# Patient Record
Sex: Female | Born: 1955
Health system: Southern US, Community
[De-identification: ages and names within clinical notes are randomized; demographics above are authoritative.]

## PROBLEM LIST (undated history)

## (undated) DIAGNOSIS — I10 Essential (primary) hypertension: Secondary | ICD-10-CM

## (undated) DIAGNOSIS — E119 Type 2 diabetes mellitus without complications: Secondary | ICD-10-CM

## (undated) DIAGNOSIS — S149XXA Injury of unspecified nerves of neck, initial encounter: Secondary | ICD-10-CM

## (undated) DIAGNOSIS — G589 Mononeuropathy, unspecified: Secondary | ICD-10-CM

## (undated) HISTORY — PX: CARDIAC SURGERY: SHX584

---

## 1997-08-01 ENCOUNTER — Other Ambulatory Visit: Admission: RE | Admit: 1997-08-01 | Discharge: 1997-08-01 | Payer: Self-pay | Admitting: *Deleted

## 1997-08-08 ENCOUNTER — Inpatient Hospital Stay (HOSPITAL_COMMUNITY): Admission: RE | Admit: 1997-08-08 | Discharge: 1997-08-14 | Payer: Self-pay | Admitting: Surgery

## 1997-08-14 ENCOUNTER — Emergency Department (HOSPITAL_COMMUNITY): Admission: EM | Admit: 1997-08-14 | Discharge: 1997-08-14 | Payer: Self-pay | Admitting: Emergency Medicine

## 1997-09-04 ENCOUNTER — Encounter: Admission: RE | Admit: 1997-09-04 | Discharge: 1997-09-04 | Payer: Self-pay | Admitting: Family Medicine

## 1997-09-18 ENCOUNTER — Encounter: Admission: RE | Admit: 1997-09-18 | Discharge: 1997-09-18 | Payer: Self-pay | Admitting: Family Medicine

## 1997-10-03 ENCOUNTER — Encounter: Admission: RE | Admit: 1997-10-03 | Discharge: 1997-10-03 | Payer: Self-pay | Admitting: Family Medicine

## 1997-10-09 ENCOUNTER — Encounter: Admission: RE | Admit: 1997-10-09 | Discharge: 1997-10-09 | Payer: Self-pay | Admitting: Family Medicine

## 1997-10-17 ENCOUNTER — Encounter: Admission: RE | Admit: 1997-10-17 | Discharge: 1997-10-17 | Payer: Self-pay | Admitting: Family Medicine

## 1997-11-06 ENCOUNTER — Encounter: Admission: RE | Admit: 1997-11-06 | Discharge: 1997-11-06 | Payer: Self-pay | Admitting: Family Medicine

## 1997-11-07 ENCOUNTER — Encounter: Admission: RE | Admit: 1997-11-07 | Discharge: 1997-11-07 | Payer: Self-pay | Admitting: Family Medicine

## 1997-11-15 ENCOUNTER — Encounter: Admission: RE | Admit: 1997-11-15 | Discharge: 1997-11-15 | Payer: Self-pay | Admitting: Family Medicine

## 1997-11-21 ENCOUNTER — Encounter: Admission: RE | Admit: 1997-11-21 | Discharge: 1997-11-21 | Payer: Self-pay | Admitting: Family Medicine

## 1997-11-28 ENCOUNTER — Encounter: Admission: RE | Admit: 1997-11-28 | Discharge: 1997-11-28 | Payer: Self-pay | Admitting: Family Medicine

## 1997-12-05 ENCOUNTER — Encounter: Admission: RE | Admit: 1997-12-05 | Discharge: 1997-12-05 | Payer: Self-pay | Admitting: Family Medicine

## 1997-12-31 ENCOUNTER — Encounter: Admission: RE | Admit: 1997-12-31 | Discharge: 1997-12-31 | Payer: Self-pay | Admitting: Sports Medicine

## 1998-01-02 ENCOUNTER — Encounter: Admission: RE | Admit: 1998-01-02 | Discharge: 1998-01-02 | Payer: Self-pay | Admitting: Family Medicine

## 1998-01-22 ENCOUNTER — Encounter: Admission: RE | Admit: 1998-01-22 | Discharge: 1998-01-22 | Payer: Self-pay | Admitting: Family Medicine

## 1998-02-06 ENCOUNTER — Encounter: Admission: RE | Admit: 1998-02-06 | Discharge: 1998-02-06 | Payer: Self-pay | Admitting: Family Medicine

## 1998-02-13 ENCOUNTER — Encounter: Admission: RE | Admit: 1998-02-13 | Discharge: 1998-02-13 | Payer: Self-pay | Admitting: Family Medicine

## 1998-02-24 ENCOUNTER — Encounter: Admission: RE | Admit: 1998-02-24 | Discharge: 1998-02-24 | Payer: Self-pay | Admitting: Family Medicine

## 1998-02-27 ENCOUNTER — Encounter: Admission: RE | Admit: 1998-02-27 | Discharge: 1998-02-27 | Payer: Self-pay | Admitting: Family Medicine

## 1998-03-13 ENCOUNTER — Encounter: Admission: RE | Admit: 1998-03-13 | Discharge: 1998-03-13 | Payer: Self-pay | Admitting: Family Medicine

## 1998-03-24 ENCOUNTER — Encounter: Admission: RE | Admit: 1998-03-24 | Discharge: 1998-03-24 | Payer: Self-pay | Admitting: Family Medicine

## 1998-03-27 ENCOUNTER — Encounter: Admission: RE | Admit: 1998-03-27 | Discharge: 1998-03-27 | Payer: Self-pay | Admitting: Family Medicine

## 1998-04-08 ENCOUNTER — Encounter: Admission: RE | Admit: 1998-04-08 | Discharge: 1998-04-08 | Payer: Self-pay | Admitting: Sports Medicine

## 1998-04-22 ENCOUNTER — Encounter: Admission: RE | Admit: 1998-04-22 | Discharge: 1998-04-22 | Payer: Self-pay | Admitting: Sports Medicine

## 1998-04-23 ENCOUNTER — Encounter: Admission: RE | Admit: 1998-04-23 | Discharge: 1998-04-23 | Payer: Self-pay | Admitting: Family Medicine

## 1998-05-15 ENCOUNTER — Encounter: Admission: RE | Admit: 1998-05-15 | Discharge: 1998-05-15 | Payer: Self-pay | Admitting: Family Medicine

## 1998-05-22 ENCOUNTER — Encounter: Admission: RE | Admit: 1998-05-22 | Discharge: 1998-05-22 | Payer: Self-pay | Admitting: Family Medicine

## 1998-05-29 ENCOUNTER — Encounter: Admission: RE | Admit: 1998-05-29 | Discharge: 1998-05-29 | Payer: Self-pay | Admitting: Family Medicine

## 1998-06-05 ENCOUNTER — Encounter: Admission: RE | Admit: 1998-06-05 | Discharge: 1998-06-05 | Payer: Self-pay | Admitting: Family Medicine

## 1998-06-11 ENCOUNTER — Encounter: Admission: RE | Admit: 1998-06-11 | Discharge: 1998-06-11 | Payer: Self-pay | Admitting: Family Medicine

## 1998-06-19 ENCOUNTER — Encounter: Admission: RE | Admit: 1998-06-19 | Discharge: 1998-06-19 | Payer: Self-pay | Admitting: Family Medicine

## 1998-07-01 ENCOUNTER — Encounter: Admission: RE | Admit: 1998-07-01 | Discharge: 1998-07-01 | Payer: Self-pay | Admitting: Sports Medicine

## 1998-07-17 ENCOUNTER — Encounter: Admission: RE | Admit: 1998-07-17 | Discharge: 1998-07-17 | Payer: Self-pay | Admitting: Family Medicine

## 1998-07-25 ENCOUNTER — Emergency Department (HOSPITAL_COMMUNITY): Admission: EM | Admit: 1998-07-25 | Discharge: 1998-07-25 | Payer: Self-pay | Admitting: Emergency Medicine

## 1998-07-25 ENCOUNTER — Encounter: Payer: Self-pay | Admitting: Emergency Medicine

## 1998-08-11 ENCOUNTER — Encounter: Admission: RE | Admit: 1998-08-11 | Discharge: 1998-08-11 | Payer: Self-pay | Admitting: Family Medicine

## 1998-08-19 ENCOUNTER — Other Ambulatory Visit: Admission: RE | Admit: 1998-08-19 | Discharge: 1998-08-19 | Payer: Self-pay | Admitting: *Deleted

## 1998-08-28 ENCOUNTER — Encounter: Admission: RE | Admit: 1998-08-28 | Discharge: 1998-08-28 | Payer: Self-pay | Admitting: Family Medicine

## 1998-09-10 ENCOUNTER — Encounter: Admission: RE | Admit: 1998-09-10 | Discharge: 1998-09-10 | Payer: Self-pay | Admitting: Family Medicine

## 1998-10-02 ENCOUNTER — Encounter: Admission: RE | Admit: 1998-10-02 | Discharge: 1998-10-02 | Payer: Self-pay | Admitting: Family Medicine

## 1998-10-29 ENCOUNTER — Encounter: Admission: RE | Admit: 1998-10-29 | Discharge: 1998-10-29 | Payer: Self-pay | Admitting: Family Medicine

## 1998-11-27 ENCOUNTER — Encounter: Admission: RE | Admit: 1998-11-27 | Discharge: 1998-11-27 | Payer: Self-pay | Admitting: Family Medicine

## 1999-02-03 ENCOUNTER — Encounter: Admission: RE | Admit: 1999-02-03 | Discharge: 1999-02-03 | Payer: Self-pay | Admitting: Family Medicine

## 1999-02-24 ENCOUNTER — Encounter: Admission: RE | Admit: 1999-02-24 | Discharge: 1999-02-24 | Payer: Self-pay | Admitting: Sports Medicine

## 1999-03-24 ENCOUNTER — Encounter: Admission: RE | Admit: 1999-03-24 | Discharge: 1999-03-24 | Payer: Self-pay | Admitting: Sports Medicine

## 1999-04-21 ENCOUNTER — Encounter: Admission: RE | Admit: 1999-04-21 | Discharge: 1999-04-21 | Payer: Self-pay | Admitting: Sports Medicine

## 1999-06-05 ENCOUNTER — Encounter: Admission: RE | Admit: 1999-06-05 | Discharge: 1999-06-05 | Payer: Self-pay | Admitting: Obstetrics and Gynecology

## 1999-06-05 ENCOUNTER — Encounter: Payer: Self-pay | Admitting: Obstetrics and Gynecology

## 1999-06-22 ENCOUNTER — Encounter: Admission: RE | Admit: 1999-06-22 | Discharge: 1999-06-22 | Payer: Self-pay | Admitting: Family Medicine

## 1999-07-06 ENCOUNTER — Encounter: Admission: RE | Admit: 1999-07-06 | Discharge: 1999-07-06 | Payer: Self-pay | Admitting: Family Medicine

## 1999-07-09 ENCOUNTER — Encounter: Admission: RE | Admit: 1999-07-09 | Discharge: 1999-07-09 | Payer: Self-pay | Admitting: Family Medicine

## 1999-07-13 ENCOUNTER — Encounter: Admission: RE | Admit: 1999-07-13 | Discharge: 1999-07-13 | Payer: Self-pay | Admitting: Family Medicine

## 1999-07-20 ENCOUNTER — Encounter: Admission: RE | Admit: 1999-07-20 | Discharge: 1999-08-27 | Payer: Self-pay | Admitting: Sports Medicine

## 1999-08-03 ENCOUNTER — Encounter: Admission: RE | Admit: 1999-08-03 | Discharge: 1999-08-03 | Payer: Self-pay | Admitting: Family Medicine

## 1999-08-11 ENCOUNTER — Encounter: Admission: RE | Admit: 1999-08-11 | Discharge: 1999-08-11 | Payer: Self-pay | Admitting: Sports Medicine

## 1999-08-18 ENCOUNTER — Encounter: Admission: RE | Admit: 1999-08-18 | Discharge: 1999-08-18 | Payer: Self-pay | Admitting: *Deleted

## 1999-09-08 ENCOUNTER — Encounter: Admission: RE | Admit: 1999-09-08 | Discharge: 1999-09-08 | Payer: Self-pay | Admitting: Sports Medicine

## 2000-04-12 ENCOUNTER — Other Ambulatory Visit: Admission: RE | Admit: 2000-04-12 | Discharge: 2000-04-12 | Payer: Self-pay | Admitting: *Deleted

## 2000-06-10 ENCOUNTER — Encounter: Payer: Self-pay | Admitting: Obstetrics and Gynecology

## 2000-06-10 ENCOUNTER — Encounter: Admission: RE | Admit: 2000-06-10 | Discharge: 2000-06-10 | Payer: Self-pay | Admitting: Obstetrics and Gynecology

## 2001-11-24 ENCOUNTER — Encounter: Admission: RE | Admit: 2001-11-24 | Discharge: 2001-11-24 | Payer: Self-pay | Admitting: Family Medicine

## 2001-11-27 ENCOUNTER — Encounter: Admission: RE | Admit: 2001-11-27 | Discharge: 2001-11-27 | Payer: Self-pay | Admitting: Family Medicine

## 2001-11-28 ENCOUNTER — Other Ambulatory Visit: Admission: RE | Admit: 2001-11-28 | Discharge: 2001-11-28 | Payer: Self-pay | Admitting: Obstetrics and Gynecology

## 2001-12-08 ENCOUNTER — Encounter: Payer: Self-pay | Admitting: Obstetrics and Gynecology

## 2001-12-08 ENCOUNTER — Encounter (INDEPENDENT_AMBULATORY_CARE_PROVIDER_SITE_OTHER): Payer: Self-pay | Admitting: *Deleted

## 2001-12-08 ENCOUNTER — Encounter: Admission: RE | Admit: 2001-12-08 | Discharge: 2001-12-08 | Payer: Self-pay | Admitting: Obstetrics and Gynecology

## 2003-03-05 ENCOUNTER — Encounter: Admission: RE | Admit: 2003-03-05 | Discharge: 2003-03-05 | Payer: Self-pay | Admitting: Sports Medicine

## 2003-04-15 ENCOUNTER — Other Ambulatory Visit: Admission: RE | Admit: 2003-04-15 | Discharge: 2003-04-15 | Payer: Self-pay | Admitting: Obstetrics and Gynecology

## 2003-09-16 ENCOUNTER — Encounter: Admission: RE | Admit: 2003-09-16 | Discharge: 2003-09-16 | Payer: Self-pay | Admitting: Family Medicine

## 2003-09-23 ENCOUNTER — Ambulatory Visit: Admission: RE | Admit: 2003-09-23 | Discharge: 2003-09-23 | Payer: Self-pay | Admitting: Family Medicine

## 2003-10-21 ENCOUNTER — Ambulatory Visit (HOSPITAL_COMMUNITY): Admission: RE | Admit: 2003-10-21 | Discharge: 2003-10-21 | Payer: Self-pay | Admitting: Endocrinology

## 2003-11-06 ENCOUNTER — Encounter: Admission: RE | Admit: 2003-11-06 | Discharge: 2004-02-04 | Payer: Self-pay | Admitting: Cardiovascular Disease

## 2004-03-26 ENCOUNTER — Ambulatory Visit: Payer: Self-pay | Admitting: Cardiology

## 2004-04-09 ENCOUNTER — Ambulatory Visit: Payer: Self-pay | Admitting: Internal Medicine

## 2004-04-30 ENCOUNTER — Ambulatory Visit: Payer: Self-pay | Admitting: Cardiology

## 2004-05-14 ENCOUNTER — Ambulatory Visit: Payer: Self-pay | Admitting: Internal Medicine

## 2004-05-28 ENCOUNTER — Ambulatory Visit: Payer: Self-pay | Admitting: Internal Medicine

## 2004-06-29 ENCOUNTER — Ambulatory Visit: Payer: Self-pay | Admitting: *Deleted

## 2004-08-03 ENCOUNTER — Ambulatory Visit: Payer: Self-pay | Admitting: Cardiology

## 2004-08-31 ENCOUNTER — Ambulatory Visit: Payer: Self-pay | Admitting: Cardiology

## 2004-09-21 ENCOUNTER — Ambulatory Visit: Payer: Self-pay | Admitting: Cardiology

## 2004-10-19 ENCOUNTER — Ambulatory Visit: Payer: Self-pay | Admitting: Internal Medicine

## 2004-11-02 ENCOUNTER — Ambulatory Visit: Payer: Self-pay | Admitting: Cardiology

## 2004-11-12 ENCOUNTER — Ambulatory Visit: Payer: Self-pay | Admitting: Cardiology

## 2004-11-30 ENCOUNTER — Ambulatory Visit: Payer: Self-pay | Admitting: Internal Medicine

## 2005-01-13 ENCOUNTER — Ambulatory Visit: Payer: Self-pay | Admitting: Cardiology

## 2005-02-10 ENCOUNTER — Ambulatory Visit: Payer: Self-pay | Admitting: Cardiology

## 2005-03-03 ENCOUNTER — Ambulatory Visit: Payer: Self-pay | Admitting: Cardiology

## 2005-03-12 ENCOUNTER — Ambulatory Visit: Payer: Self-pay | Admitting: Family Medicine

## 2005-03-24 ENCOUNTER — Ambulatory Visit: Payer: Self-pay | Admitting: Cardiology

## 2005-04-21 ENCOUNTER — Ambulatory Visit: Payer: Self-pay | Admitting: Cardiology

## 2005-05-24 ENCOUNTER — Ambulatory Visit: Payer: Self-pay | Admitting: Cardiology

## 2005-06-21 ENCOUNTER — Ambulatory Visit: Payer: Self-pay | Admitting: Internal Medicine

## 2005-06-24 ENCOUNTER — Ambulatory Visit (HOSPITAL_COMMUNITY): Admission: RE | Admit: 2005-06-24 | Discharge: 2005-06-24 | Payer: Self-pay | Admitting: Family Medicine

## 2005-06-24 ENCOUNTER — Ambulatory Visit: Payer: Self-pay | Admitting: Cardiology

## 2005-06-25 ENCOUNTER — Inpatient Hospital Stay (HOSPITAL_COMMUNITY): Admission: EM | Admit: 2005-06-25 | Discharge: 2005-07-01 | Payer: Self-pay | Admitting: Emergency Medicine

## 2005-07-02 ENCOUNTER — Ambulatory Visit: Payer: Self-pay | Admitting: Cardiology

## 2005-07-05 ENCOUNTER — Ambulatory Visit: Payer: Self-pay | Admitting: Internal Medicine

## 2005-07-12 ENCOUNTER — Ambulatory Visit (HOSPITAL_COMMUNITY): Admission: RE | Admit: 2005-07-12 | Discharge: 2005-07-12 | Payer: Self-pay | Admitting: Gastroenterology

## 2005-07-12 ENCOUNTER — Ambulatory Visit: Payer: Self-pay | Admitting: Cardiology

## 2005-08-06 ENCOUNTER — Ambulatory Visit: Payer: Self-pay | Admitting: Cardiovascular Disease

## 2005-09-03 ENCOUNTER — Ambulatory Visit: Payer: Self-pay | Admitting: Cardiology

## 2005-09-14 ENCOUNTER — Ambulatory Visit: Payer: Self-pay | Admitting: Cardiovascular Disease

## 2005-09-16 ENCOUNTER — Ambulatory Visit: Payer: Self-pay | Admitting: Cardiology

## 2005-09-21 ENCOUNTER — Ambulatory Visit (HOSPITAL_COMMUNITY): Admission: RE | Admit: 2005-09-21 | Discharge: 2005-09-21 | Payer: Self-pay | Admitting: Obstetrics and Gynecology

## 2005-10-05 ENCOUNTER — Ambulatory Visit: Payer: Self-pay | Admitting: Cardiology

## 2005-10-25 ENCOUNTER — Ambulatory Visit: Payer: Self-pay | Admitting: Cardiology

## 2005-11-12 ENCOUNTER — Ambulatory Visit: Payer: Self-pay | Admitting: Cardiology

## 2005-12-01 ENCOUNTER — Ambulatory Visit: Payer: Self-pay | Admitting: Cardiology

## 2005-12-21 ENCOUNTER — Ambulatory Visit: Payer: Self-pay | Admitting: Cardiology

## 2006-01-18 ENCOUNTER — Ambulatory Visit: Payer: Self-pay | Admitting: Cardiology

## 2006-02-22 ENCOUNTER — Ambulatory Visit: Payer: Self-pay | Admitting: Cardiology

## 2006-03-18 ENCOUNTER — Ambulatory Visit: Payer: Self-pay | Admitting: Cardiovascular Disease

## 2006-03-18 ENCOUNTER — Ambulatory Visit: Payer: Self-pay | Admitting: Cardiology

## 2006-03-20 ENCOUNTER — Encounter: Admission: RE | Admit: 2006-03-20 | Discharge: 2006-03-20 | Payer: Self-pay | Admitting: Orthopedic Surgery

## 2006-04-04 ENCOUNTER — Ambulatory Visit: Payer: Self-pay | Admitting: Cardiology

## 2006-04-25 ENCOUNTER — Ambulatory Visit: Payer: Self-pay | Admitting: Internal Medicine

## 2006-04-29 ENCOUNTER — Ambulatory Visit: Payer: Self-pay | Admitting: Cardiology

## 2006-05-12 ENCOUNTER — Ambulatory Visit: Payer: Self-pay | Admitting: Cardiology

## 2006-06-03 ENCOUNTER — Ambulatory Visit: Payer: Self-pay | Admitting: Internal Medicine

## 2006-06-15 ENCOUNTER — Ambulatory Visit: Payer: Self-pay | Admitting: Internal Medicine

## 2006-06-21 ENCOUNTER — Ambulatory Visit: Payer: Self-pay | Admitting: Internal Medicine

## 2006-06-21 ENCOUNTER — Ambulatory Visit: Payer: Self-pay

## 2006-06-21 ENCOUNTER — Encounter: Payer: Self-pay | Admitting: Cardiology

## 2006-06-21 LAB — CONVERTED CEMR LAB
ALT: 24 units/L (ref 0–40)
AST: 24 units/L (ref 0–37)
Albumin: 4.1 g/dL (ref 3.5–5.2)
CO2: 29 meq/L (ref 19–32)
Calcium: 9.4 mg/dL (ref 8.4–10.5)
GFR calc Af Amer: 113 mL/min
Glucose, Bld: 95 mg/dL (ref 70–99)
Total CHOL/HDL Ratio: 5

## 2006-06-27 ENCOUNTER — Ambulatory Visit: Payer: Self-pay | Admitting: Cardiology

## 2006-07-07 DIAGNOSIS — Q254 Other congenital malformations of aorta: Secondary | ICD-10-CM | POA: Insufficient documentation

## 2006-07-08 ENCOUNTER — Encounter (INDEPENDENT_AMBULATORY_CARE_PROVIDER_SITE_OTHER): Payer: Self-pay | Admitting: *Deleted

## 2006-07-14 ENCOUNTER — Ambulatory Visit: Payer: Self-pay | Admitting: Cardiology

## 2006-08-04 ENCOUNTER — Ambulatory Visit: Payer: Self-pay | Admitting: Cardiology

## 2006-08-05 ENCOUNTER — Ambulatory Visit: Payer: Self-pay | Admitting: Internal Medicine

## 2006-08-05 LAB — CONVERTED CEMR LAB
Chloride: 102 meq/L (ref 96–112)
GFR calc Af Amer: 97 mL/min
Glucose, Bld: 105 mg/dL — ABNORMAL HIGH (ref 70–99)
Potassium: 3.5 meq/L (ref 3.5–5.1)

## 2006-08-11 ENCOUNTER — Ambulatory Visit: Payer: Self-pay | Admitting: Family Medicine

## 2006-08-11 ENCOUNTER — Telehealth: Payer: Self-pay | Admitting: *Deleted

## 2006-08-17 ENCOUNTER — Ambulatory Visit: Payer: Self-pay | Admitting: Internal Medicine

## 2006-09-01 ENCOUNTER — Ambulatory Visit: Payer: Self-pay | Admitting: Cardiology

## 2006-10-18 ENCOUNTER — Ambulatory Visit: Payer: Self-pay | Admitting: Cardiology

## 2006-11-24 ENCOUNTER — Ambulatory Visit: Payer: Self-pay | Admitting: Cardiology

## 2006-12-08 ENCOUNTER — Ambulatory Visit: Payer: Self-pay | Admitting: Cardiology

## 2007-01-05 ENCOUNTER — Ambulatory Visit: Payer: Self-pay | Admitting: Cardiology

## 2007-01-23 ENCOUNTER — Ambulatory Visit: Payer: Self-pay | Admitting: Internal Medicine

## 2007-02-08 ENCOUNTER — Ambulatory Visit: Payer: Self-pay | Admitting: Internal Medicine

## 2007-02-08 LAB — CONVERTED CEMR LAB
Bilirubin, Direct: 0.2 mg/dL (ref 0.0–0.3)
Calcium: 9.1 mg/dL (ref 8.4–10.5)
Chloride: 108 meq/L (ref 96–112)
Cholesterol: 185 mg/dL (ref 0–200)
GFR calc Af Amer: 113 mL/min
LDL Cholesterol: 133 mg/dL — ABNORMAL HIGH (ref 0–99)
Total CHOL/HDL Ratio: 5
Total Protein: 6.3 g/dL (ref 6.0–8.3)
Triglycerides: 75 mg/dL (ref 0–149)

## 2007-02-15 ENCOUNTER — Ambulatory Visit: Payer: Self-pay | Admitting: Internal Medicine

## 2007-02-22 ENCOUNTER — Ambulatory Visit: Payer: Self-pay | Admitting: Family Medicine

## 2007-03-16 ENCOUNTER — Ambulatory Visit: Payer: Self-pay | Admitting: Cardiology

## 2007-04-13 ENCOUNTER — Ambulatory Visit: Payer: Self-pay | Admitting: Internal Medicine

## 2007-04-13 ENCOUNTER — Ambulatory Visit: Payer: Self-pay | Admitting: Cardiology

## 2007-04-13 LAB — CONVERTED CEMR LAB
AST: 23 units/L (ref 0–37)
CO2: 30 meq/L (ref 19–32)
Calcium: 9.5 mg/dL (ref 8.4–10.5)
GFR calc Af Amer: 113 mL/min
HDL: 45.3 mg/dL (ref >39.0)
LDL Cholesterol: 95 mg/dL (ref 0–99)
Sodium: 141 meq/L (ref 135–145)
Total Bilirubin: 1.6 mg/dL — ABNORMAL HIGH (ref 0.3–1.2)
Total CHOL/HDL Ratio: 3.5
Triglycerides: 83 mg/dL (ref 0–149)
VLDL: 17 mg/dL (ref 0–40)

## 2007-05-02 ENCOUNTER — Ambulatory Visit: Payer: Self-pay | Admitting: Cardiology

## 2007-05-30 ENCOUNTER — Ambulatory Visit: Payer: Self-pay | Admitting: Cardiovascular Disease

## 2007-06-12 ENCOUNTER — Encounter: Payer: Self-pay | Admitting: Internal Medicine

## 2007-06-12 ENCOUNTER — Ambulatory Visit: Payer: Self-pay

## 2007-06-20 ENCOUNTER — Ambulatory Visit: Payer: Self-pay | Admitting: Cardiovascular Disease

## 2007-07-07 ENCOUNTER — Ambulatory Visit: Payer: Self-pay | Admitting: Internal Medicine

## 2007-08-03 ENCOUNTER — Ambulatory Visit: Payer: Self-pay | Admitting: Internal Medicine

## 2007-09-15 ENCOUNTER — Ambulatory Visit: Payer: Self-pay | Admitting: Cardiology

## 2007-10-13 ENCOUNTER — Ambulatory Visit: Payer: Self-pay | Admitting: Cardiovascular Disease

## 2007-11-01 ENCOUNTER — Ambulatory Visit (HOSPITAL_COMMUNITY): Admission: RE | Admit: 2007-11-01 | Discharge: 2007-11-01 | Payer: Self-pay | Admitting: Obstetrics and Gynecology

## 2007-11-08 ENCOUNTER — Ambulatory Visit: Payer: Self-pay | Admitting: Internal Medicine

## 2007-12-06 ENCOUNTER — Ambulatory Visit: Payer: Self-pay | Admitting: Cardiology

## 2008-01-08 ENCOUNTER — Ambulatory Visit: Payer: Self-pay | Admitting: Cardiology

## 2008-02-05 ENCOUNTER — Ambulatory Visit: Payer: Self-pay | Admitting: Cardiovascular Disease

## 2008-02-19 ENCOUNTER — Ambulatory Visit: Payer: Self-pay | Admitting: Family Medicine

## 2008-02-27 ENCOUNTER — Ambulatory Visit: Payer: Self-pay | Admitting: Internal Medicine

## 2008-02-27 LAB — CONVERTED CEMR LAB
ALT: 20 units/L (ref 0–35)
Albumin: 3.8 g/dL (ref 3.5–5.2)
Alkaline Phosphatase: 62 units/L (ref 39–117)
Bilirubin, Direct: 0.2 mg/dL (ref 0.0–0.3)
CO2: 30 meq/L (ref 19–32)
Calcium: 8.9 mg/dL (ref 8.4–10.5)
Cholesterol: 151 mg/dL (ref 0–200)
GFR calc Af Amer: 112 mL/min
GFR calc non Af Amer: 93 mL/min
HDL: 40 mg/dL (ref >39.0)
Total CHOL/HDL Ratio: 3.8
Total Protein: 6.4 g/dL (ref 6.0–8.3)
VLDL: 14 mg/dL (ref 0–40)

## 2008-03-04 ENCOUNTER — Ambulatory Visit: Payer: Self-pay | Admitting: Internal Medicine

## 2008-03-07 ENCOUNTER — Ambulatory Visit: Payer: Self-pay | Admitting: Internal Medicine

## 2008-03-25 ENCOUNTER — Ambulatory Visit: Payer: Self-pay | Admitting: Cardiology

## 2008-04-22 ENCOUNTER — Ambulatory Visit: Payer: Self-pay | Admitting: Cardiology

## 2008-05-13 ENCOUNTER — Ambulatory Visit: Payer: Self-pay | Admitting: Internal Medicine

## 2008-06-10 ENCOUNTER — Ambulatory Visit: Payer: Self-pay | Admitting: Cardiology

## 2008-06-12 ENCOUNTER — Ambulatory Visit: Payer: Self-pay | Admitting: Internal Medicine

## 2008-06-12 LAB — CONVERTED CEMR LAB
BUN: 18 mg/dL (ref 6–23)
Creatinine, Ser: 0.6 mg/dL (ref 0.4–1.2)
Glucose, Bld: 97 mg/dL (ref 70–99)
Potassium: 4.2 meq/L (ref 3.5–5.1)

## 2008-06-17 ENCOUNTER — Ambulatory Visit (HOSPITAL_COMMUNITY): Admission: RE | Admit: 2008-06-17 | Discharge: 2008-06-17 | Payer: Self-pay | Admitting: Internal Medicine

## 2008-07-08 ENCOUNTER — Ambulatory Visit: Payer: Self-pay | Admitting: Cardiology

## 2008-07-16 ENCOUNTER — Ambulatory Visit: Payer: Self-pay | Admitting: Surgery

## 2008-07-29 DIAGNOSIS — I712 Thoracic aortic aneurysm, without rupture: Secondary | ICD-10-CM | POA: Insufficient documentation

## 2008-07-29 DIAGNOSIS — E663 Overweight: Secondary | ICD-10-CM | POA: Insufficient documentation

## 2008-07-29 DIAGNOSIS — E785 Hyperlipidemia, unspecified: Secondary | ICD-10-CM | POA: Insufficient documentation

## 2008-07-29 DIAGNOSIS — I10 Essential (primary) hypertension: Secondary | ICD-10-CM | POA: Insufficient documentation

## 2008-07-31 ENCOUNTER — Ambulatory Visit: Payer: Self-pay | Admitting: Internal Medicine

## 2008-07-31 ENCOUNTER — Encounter: Payer: Self-pay | Admitting: Physician Assistant

## 2008-08-05 ENCOUNTER — Ambulatory Visit: Payer: Self-pay | Admitting: Cardiology

## 2008-09-02 ENCOUNTER — Ambulatory Visit: Payer: Self-pay | Admitting: Cardiology

## 2008-09-02 ENCOUNTER — Telehealth: Payer: Self-pay | Admitting: Internal Medicine

## 2008-09-30 ENCOUNTER — Ambulatory Visit: Payer: Self-pay | Admitting: Cardiovascular Disease

## 2008-10-08 ENCOUNTER — Encounter: Payer: Self-pay | Admitting: *Deleted

## 2008-10-28 ENCOUNTER — Ambulatory Visit: Payer: Self-pay | Admitting: Internal Medicine

## 2008-10-31 ENCOUNTER — Ambulatory Visit: Payer: Self-pay | Admitting: Cardiovascular Disease

## 2008-10-31 ENCOUNTER — Inpatient Hospital Stay (HOSPITAL_COMMUNITY): Admission: EM | Admit: 2008-10-31 | Discharge: 2008-11-01 | Payer: Self-pay | Admitting: Emergency Medicine

## 2008-11-05 ENCOUNTER — Encounter: Payer: Self-pay | Admitting: Family Medicine

## 2008-11-06 ENCOUNTER — Telehealth (INDEPENDENT_AMBULATORY_CARE_PROVIDER_SITE_OTHER): Payer: Self-pay

## 2008-11-07 ENCOUNTER — Encounter: Payer: Self-pay | Admitting: Internal Medicine

## 2008-11-07 ENCOUNTER — Ambulatory Visit: Payer: Self-pay

## 2008-11-12 ENCOUNTER — Ambulatory Visit: Payer: Self-pay | Admitting: Cardiology

## 2008-11-12 DIAGNOSIS — Z954 Presence of other heart-valve replacement: Secondary | ICD-10-CM | POA: Insufficient documentation

## 2008-11-12 LAB — CONVERTED CEMR LAB: Prothrombin Time: 33.9 s

## 2008-11-13 ENCOUNTER — Encounter: Payer: Self-pay | Admitting: *Deleted

## 2008-11-14 ENCOUNTER — Telehealth: Payer: Self-pay | Admitting: *Deleted

## 2008-11-14 ENCOUNTER — Emergency Department (HOSPITAL_COMMUNITY): Admission: EM | Admit: 2008-11-14 | Discharge: 2008-11-14 | Payer: Self-pay | Admitting: Family Medicine

## 2008-11-15 ENCOUNTER — Ambulatory Visit: Payer: Self-pay | Admitting: Cardiology

## 2008-11-15 ENCOUNTER — Encounter (INDEPENDENT_AMBULATORY_CARE_PROVIDER_SITE_OTHER): Payer: Self-pay | Admitting: Cardiology

## 2008-11-15 ENCOUNTER — Ambulatory Visit (HOSPITAL_COMMUNITY): Admission: RE | Admit: 2008-11-15 | Discharge: 2008-11-15 | Payer: Self-pay | Admitting: Obstetrics and Gynecology

## 2008-11-15 ENCOUNTER — Encounter: Payer: Self-pay | Admitting: Nurse Practitioner

## 2008-11-15 LAB — CONVERTED CEMR LAB: Prothrombin Time: 15.3 s

## 2008-11-19 ENCOUNTER — Encounter (INDEPENDENT_AMBULATORY_CARE_PROVIDER_SITE_OTHER): Payer: Self-pay | Admitting: *Deleted

## 2008-11-21 ENCOUNTER — Ambulatory Visit: Payer: Self-pay | Admitting: Cardiology

## 2008-11-21 LAB — CONVERTED CEMR LAB
POC INR: 2.3
Prothrombin Time: 18.7 s

## 2008-11-25 ENCOUNTER — Encounter: Admission: RE | Admit: 2008-11-25 | Discharge: 2008-11-25 | Payer: Self-pay | Admitting: Emergency Medicine

## 2008-12-09 ENCOUNTER — Ambulatory Visit: Payer: Self-pay | Admitting: Internal Medicine

## 2008-12-09 LAB — CONVERTED CEMR LAB
POC INR: 2.1
Prothrombin Time: 17.9 s

## 2008-12-18 ENCOUNTER — Encounter: Payer: Self-pay | Admitting: Family Medicine

## 2008-12-18 ENCOUNTER — Ambulatory Visit: Payer: Self-pay | Admitting: Family Medicine

## 2008-12-18 DIAGNOSIS — R5381 Other malaise: Secondary | ICD-10-CM | POA: Insufficient documentation

## 2008-12-18 DIAGNOSIS — R229 Localized swelling, mass and lump, unspecified: Secondary | ICD-10-CM | POA: Insufficient documentation

## 2008-12-18 DIAGNOSIS — R5383 Other fatigue: Secondary | ICD-10-CM

## 2008-12-19 ENCOUNTER — Encounter: Admission: RE | Admit: 2008-12-19 | Discharge: 2008-12-19 | Payer: Self-pay | Admitting: Family Medicine

## 2008-12-19 LAB — CONVERTED CEMR LAB
Basophils Absolute: 0 10*3/uL (ref 0.0–0.1)
Basophils Relative: 0 % (ref 0–1)
CO2: 26 meq/L (ref 19–32)
Chloride: 106 meq/L (ref 96–112)
Eosinophils Absolute: 0.1 10*3/uL (ref 0.0–0.7)
MCHC: 32.9 g/dL (ref 30.0–36.0)
Monocytes Absolute: 0.4 10*3/uL (ref 0.1–1.0)
Neutro Abs: 3 10*3/uL (ref 1.7–7.7)
Neutrophils Relative %: 52 % (ref 43–77)
Platelets: 210 10*3/uL (ref 150–400)
Potassium: 4 meq/L (ref 3.5–5.3)
RDW: 13.7 % (ref 11.5–15.5)
Sodium: 140 meq/L (ref 135–145)
Total Bilirubin: 1.8 mg/dL — ABNORMAL HIGH (ref 0.3–1.2)
Total Protein: 7.1 g/dL (ref 6.0–8.3)

## 2008-12-30 ENCOUNTER — Telehealth: Payer: Self-pay | Admitting: Internal Medicine

## 2009-01-08 ENCOUNTER — Ambulatory Visit: Payer: Self-pay | Admitting: Internal Medicine

## 2009-02-05 ENCOUNTER — Ambulatory Visit: Payer: Self-pay | Admitting: Cardiovascular Disease

## 2009-02-05 LAB — CONVERTED CEMR LAB: POC INR: 3.2

## 2009-02-19 ENCOUNTER — Encounter (INDEPENDENT_AMBULATORY_CARE_PROVIDER_SITE_OTHER): Payer: Self-pay | Admitting: *Deleted

## 2009-03-04 ENCOUNTER — Ambulatory Visit: Payer: Self-pay | Admitting: Cardiovascular Disease

## 2009-03-04 LAB — CONVERTED CEMR LAB: POC INR: 3.1

## 2009-03-25 ENCOUNTER — Ambulatory Visit: Payer: Self-pay | Admitting: Cardiology

## 2009-04-15 ENCOUNTER — Ambulatory Visit: Payer: Self-pay | Admitting: Cardiovascular Disease

## 2009-04-15 LAB — CONVERTED CEMR LAB: POC INR: 2

## 2009-05-15 ENCOUNTER — Ambulatory Visit: Payer: Self-pay | Admitting: Cardiovascular Disease

## 2009-05-15 LAB — CONVERTED CEMR LAB: POC INR: 2.4

## 2009-06-17 ENCOUNTER — Ambulatory Visit: Payer: Self-pay | Admitting: Cardiology

## 2009-06-17 LAB — CONVERTED CEMR LAB: POC INR: 2

## 2009-07-16 ENCOUNTER — Ambulatory Visit: Payer: Self-pay | Admitting: Cardiovascular Disease

## 2009-07-16 LAB — CONVERTED CEMR LAB: POC INR: 1.5

## 2009-07-30 ENCOUNTER — Ambulatory Visit: Payer: Self-pay | Admitting: Internal Medicine

## 2009-08-11 ENCOUNTER — Encounter: Admission: RE | Admit: 2009-08-11 | Discharge: 2009-08-11 | Payer: Self-pay | Admitting: Surgery

## 2009-08-20 ENCOUNTER — Ambulatory Visit: Payer: Self-pay | Admitting: Internal Medicine

## 2009-08-20 LAB — CONVERTED CEMR LAB: POC INR: 1.8

## 2009-09-02 ENCOUNTER — Ambulatory Visit: Payer: Self-pay | Admitting: Surgery

## 2009-09-10 ENCOUNTER — Ambulatory Visit: Payer: Self-pay | Admitting: Cardiology

## 2009-09-10 LAB — CONVERTED CEMR LAB: POC INR: 2

## 2009-09-22 ENCOUNTER — Telehealth: Payer: Self-pay | Admitting: Internal Medicine

## 2009-09-25 ENCOUNTER — Ambulatory Visit: Payer: Self-pay | Admitting: Internal Medicine

## 2009-09-30 ENCOUNTER — Ambulatory Visit: Payer: Self-pay | Admitting: Internal Medicine

## 2009-09-30 DIAGNOSIS — R002 Palpitations: Secondary | ICD-10-CM | POA: Insufficient documentation

## 2009-10-01 ENCOUNTER — Telehealth: Payer: Self-pay | Admitting: Internal Medicine

## 2009-10-02 LAB — CONVERTED CEMR LAB
AST: 15 units/L (ref 0–37)
Albumin: 4 g/dL (ref 3.5–5.2)
Basophils Relative: 0.6 % (ref 0.0–3.0)
Calcium: 9.1 mg/dL (ref 8.4–10.5)
Creatinine, Ser: 0.6 mg/dL (ref 0.4–1.2)
Eosinophils Relative: 1.9 % (ref 0.0–5.0)
HDL: 46.1 mg/dL (ref >39.00)
LDL Cholesterol: 108 mg/dL — ABNORMAL HIGH (ref 0–99)
Lymphocytes Relative: 38 % (ref 12.0–46.0)
Monocytes Relative: 6.5 % (ref 3.0–12.0)
Neutrophils Relative %: 53 % (ref 43.0–77.0)
RBC: 4.33 M/uL (ref 3.87–5.11)
Total CHOL/HDL Ratio: 4
Triglycerides: 65 mg/dL (ref 0.0–149.0)
WBC: 3.9 10*3/uL — ABNORMAL LOW (ref 4.5–10.5)

## 2009-10-20 ENCOUNTER — Ambulatory Visit: Payer: Self-pay

## 2009-10-20 ENCOUNTER — Ambulatory Visit: Payer: Self-pay | Admitting: Cardiovascular Disease

## 2009-10-20 ENCOUNTER — Encounter: Payer: Self-pay | Admitting: Internal Medicine

## 2009-10-20 ENCOUNTER — Ambulatory Visit (HOSPITAL_COMMUNITY): Admission: RE | Admit: 2009-10-20 | Discharge: 2009-10-20 | Payer: Self-pay | Admitting: Internal Medicine

## 2009-10-28 ENCOUNTER — Ambulatory Visit: Payer: Self-pay | Admitting: Cardiology

## 2009-10-28 ENCOUNTER — Ambulatory Visit: Payer: Self-pay | Admitting: Internal Medicine

## 2009-10-28 LAB — CONVERTED CEMR LAB: POC INR: 2.1

## 2009-11-07 ENCOUNTER — Ambulatory Visit: Payer: Self-pay | Admitting: Internal Medicine

## 2009-11-14 ENCOUNTER — Telehealth: Payer: Self-pay | Admitting: Internal Medicine

## 2009-11-25 ENCOUNTER — Ambulatory Visit: Payer: Self-pay | Admitting: Internal Medicine

## 2009-12-16 ENCOUNTER — Encounter: Payer: Self-pay | Admitting: Internal Medicine

## 2009-12-16 ENCOUNTER — Ambulatory Visit: Payer: Self-pay | Admitting: Cardiology

## 2009-12-16 LAB — CONVERTED CEMR LAB: POC INR: 2.2

## 2010-01-13 ENCOUNTER — Ambulatory Visit: Payer: Self-pay | Admitting: Cardiovascular Disease

## 2010-01-13 LAB — CONVERTED CEMR LAB: POC INR: 2.3

## 2010-01-15 ENCOUNTER — Ambulatory Visit: Payer: Self-pay | Admitting: Internal Medicine

## 2010-01-27 LAB — CONVERTED CEMR LAB
Alkaline Phosphatase: 59 units/L (ref 39–117)
BUN: 13 mg/dL (ref 6–23)
Bilirubin, Direct: 0.3 mg/dL (ref 0.0–0.3)
Chloride: 106 meq/L (ref 96–112)
Cholesterol: 164 mg/dL (ref 0–200)
Creatinine, Ser: 0.6 mg/dL (ref 0.4–1.2)
LDL Cholesterol: 109 mg/dL — ABNORMAL HIGH (ref 0–99)
Total Bilirubin: 2 mg/dL — ABNORMAL HIGH (ref 0.3–1.2)
Total CHOL/HDL Ratio: 4
Total Protein: 6.4 g/dL (ref 6.0–8.3)

## 2010-02-10 ENCOUNTER — Ambulatory Visit: Payer: Self-pay | Admitting: Cardiology

## 2010-03-11 ENCOUNTER — Ambulatory Visit: Payer: Self-pay | Admitting: Cardiology

## 2010-04-08 ENCOUNTER — Ambulatory Visit: Payer: Self-pay | Admitting: Internal Medicine

## 2010-04-14 ENCOUNTER — Encounter: Payer: Self-pay | Admitting: Internal Medicine

## 2010-04-16 ENCOUNTER — Telehealth: Payer: Self-pay | Admitting: Internal Medicine

## 2010-04-20 ENCOUNTER — Ambulatory Visit: Payer: Self-pay | Admitting: Internal Medicine

## 2010-04-23 ENCOUNTER — Ambulatory Visit: Payer: Self-pay | Admitting: Internal Medicine

## 2010-04-23 ENCOUNTER — Encounter: Payer: Self-pay | Admitting: Internal Medicine

## 2010-04-25 LAB — CONVERTED CEMR LAB
Bilirubin, Direct: 0.2 mg/dL (ref 0.0–0.3)
HDL: 43.4 mg/dL (ref >39.00)
Total Bilirubin: 1 mg/dL (ref 0.3–1.2)
Total CHOL/HDL Ratio: 3
VLDL: 8.4 mg/dL (ref 0.0–40.0)

## 2010-05-20 ENCOUNTER — Ambulatory Visit: Admission: RE | Admit: 2010-05-20 | Discharge: 2010-05-20 | Payer: Self-pay | Source: Home / Self Care

## 2010-05-31 ENCOUNTER — Encounter: Payer: Self-pay | Admitting: Surgery

## 2010-06-02 ENCOUNTER — Ambulatory Visit (HOSPITAL_COMMUNITY)
Admission: RE | Admit: 2010-06-02 | Discharge: 2010-06-02 | Payer: Self-pay | Source: Home / Self Care | Attending: Obstetrics and Gynecology | Admitting: Obstetrics and Gynecology

## 2010-06-09 NOTE — Medication Information (Signed)
Summary: rov/tm  Anticoagulant Therapy  Managed by: Bethena Midget, RN, BSN Referring MD: D.Bensimhon PCP: Marisue Ivan  MD Supervising MD: Excell Seltzer MD, Casimiro Needle Indication 1: Aortic Valve Replacement (ICD-V43.3) Indication 2: 6 months therapy (ICD-6) Lab Used: [facility] Site: [facility] INR POC 2.4 INR RANGE 2 - 3  Dietary changes: no    Health status changes: no    Bleeding/hemorrhagic complications: no    Recent/future hospitalizations: no    Any changes in medication regimen? no    Recent/future dental: no  Any missed doses?: no       Is patient compliant with meds? yes       Current Medications (verified): 1)  Warfarin Sodium 7.5 Mg Tabs (Warfarin Sodium) .... Use As Directed By Anticoagulation Clinic 2)  Aspirin 81 Mg Tbec (Aspirin) .... On Hold For One Week 3)  Simvastatin 20 Mg Tabs (Simvastatin) .... Take One Tablet By Mouth Daily At Bedtime 4)  Calcium Carbonate   Powd (Calcium Carbonate) .... Once Daily As Needed 5)  Multivitamins   Tabs (Multiple Vitamin) .... Once Daily As Needed 6)  Vitamin D 1000 Unit  Tabs (Cholecalciferol) .... Once Daily As Needed 7)  Co Q-10 30 Mg  Caps (Coenzyme Q10) .... Once Daily As Needed 8)  Fish Oil   Oil (Fish Oil) .... Once Daily As Needed 9)  Silymarin  Caps (Milk Thistle-Turmeric) .... Once Daily As Needed 10)  Metoprolol Succinate 25 Mg Xr24h-Tab (Metoprolol Succinate) .Marland Kitchen.. 1 Once Daily  Allergies: 1)  ! Vancomycin 2)  ! Penicillin 3)  ! Sulfa 4)  ! Hydralazine Hcl  Anticoagulation Management History:      The patient is taking warfarin and comes in today for a routine follow up visit.  Negative risk factors for bleeding include an age less than 75 years old.  The bleeding index is 'low risk'.  Positive CHADS2 values include History of HTN.  Negative CHADS2 values include Age > 27 years old.  The start date was 08/23/1997.  Her last INR was 7.8 ratio.  Anticoagulation responsible provider: Excell Seltzer MD, Casimiro Needle.  INR  POC: 2.4.  Cuvette Lot#: [id number].  Exp: 06/2010.    Anticoagulation Management Assessment/Plan:      The patient's current anticoagulation dose is Warfarin sodium 7.5 mg tabs: Use as directed by Anticoagulation Clinic.  The target INR is 2 - 3.  The next INR is due 06/12/2009.  Anticoagulation instructions were given to patient.  Results were reviewed/authorized by Bethena Midget, RN, BSN.  She was notified by Lew Dawes, PharmD Candidate.         Prior Anticoagulation Instructions: INR 2.0 Take 7.5mg s everyday except 5mg s on Wednesdays. Recheck in 2weeks.   Current Anticoagulation Instructions: INR 2.4  Continue same dose of 7.5mg  daily except 5mg  on Wednesdays. Recheck in 4 weeks.

## 2010-06-09 NOTE — Progress Notes (Signed)
Summary: test results  Phone Note Outgoing Call   Call placed by: Meredith Staggers, RN,  November 14, 2009 2:28 PM Call placed to: Patient Summary of Call: called pt w/echo and monitor results (SR w/occ. PAC), Left message to call back   Follow-up for Phone Call        pt aware Meredith Staggers, RN  November 14, 2009 3:18 PM

## 2010-06-09 NOTE — Medication Information (Signed)
Summary: rov/eac  Anticoagulant Therapy  Managed by: Bethena Midget, RN, BSN Referring MD: D.Bensimhon PCP: Marisue Ivan  MD Supervising MD: Riley Kill MD, Maisie Fus Indication 1: Aortic Valve Replacement (ICD-V43.3) Indication 2: 6 months therapy (ICD-6) Lab Used: [facility] Site: [facility] INR POC 2.1 INR RANGE 2 - 3  Dietary changes: no    Health status changes: no    Bleeding/hemorrhagic complications: no    Recent/future hospitalizations: no    Any changes in medication regimen? yes       Details: Stopped Metoprolol, Carvedilol started and Fish Oil increased to 3grams  Recent/future dental: no  Any missed doses?: no       Is patient compliant with meds? yes       Allergies: 1)  ! Vancomycin 2)  ! Penicillin 3)  ! Sulfa 4)  ! Hydralazine Hcl  Anticoagulation Management History:      The patient is taking warfarin and comes in today for a routine follow up visit.  Negative risk factors for bleeding include an age less than 51 years old.  The bleeding index is 'low risk'.  Positive CHADS2 values include History of HTN.  Negative CHADS2 values include Age > 28 years old.  The start date was 08/23/1997.  Her last INR was 7.8 ratio.  Anticoagulation responsible provider: Riley Kill MD, Maisie Fus.  INR POC: 2.1.  Cuvette Lot#: [id number].  Exp: 01/2011.    Anticoagulation Management Assessment/Plan:      The patient's current anticoagulation dose is Warfarin sodium 7.5 mg tabs: Use as directed by Anticoagulation Clinic, Warfarin sodium 10 mg tabs: Take as directed per coumadin clinic..  The target INR is 2 - 3.  The next INR is due 11/25/2009.  Anticoagulation instructions were given to patient.  Results were reviewed/authorized by Bethena Midget, RN, BSN.  She was notified by Bethena Midget, RN, BSN.         Prior Anticoagulation Instructions: INR 2.1  Continue taking 10 mg on Wednesday and Saturday and take 7.5 mg all other days.  Return to clinic in 4 weeks.    Current  Anticoagulation Instructions: INR 2.1 Continue 7.5mg s daily except 10mg s on Wednesdays and Saturdays. Recheck in 4 weeks.

## 2010-06-09 NOTE — Medication Information (Signed)
Summary: rov/ewj  Anticoagulant Therapy  Managed by: Cloyde Reams, RN, BSN Referring MD: D.Bensimhon PCP: Marisue Ivan  MD Supervising MD: Johney Frame MD, Fayrene Fearing Indication 1: Aortic Valve Replacement (ICD-V43.3) Indication 2: 6 months therapy (ICD-6) Lab Used: [facility] Site: [facility] INR POC 1.8 INR RANGE 2 - 3  Dietary changes: no    Health status changes: no    Bleeding/hemorrhagic complications: no    Recent/future hospitalizations: no    Any changes in medication regimen? no    Recent/future dental: no  Any missed doses?: no       Is patient compliant with meds? yes       Allergies (verified): 1)  ! Vancomycin 2)  ! Penicillin 3)  ! Sulfa 4)  ! Hydralazine Hcl  Anticoagulation Management History:      The patient is taking warfarin and comes in today for a routine follow up visit.  Negative risk factors for bleeding include an age less than 52 years old.  The bleeding index is 'low risk'.  Positive CHADS2 values include History of HTN.  Negative CHADS2 values include Age > 15 years old.  The start date was 08/23/1997.  Her last INR was 7.8 ratio.  Anticoagulation responsible provider: Judianne Seiple MD, Fayrene Fearing.  INR POC: 1.8.  Exp: 09/2010.    Anticoagulation Management Assessment/Plan:      The patient's current anticoagulation dose is Warfarin sodium 7.5 mg tabs: Use as directed by Anticoagulation Clinic, Warfarin sodium 10 mg tabs: Take as directed per coumadin clinic..  The target INR is 2 - 3.  The next INR is due 09/10/2009.  Anticoagulation instructions were given to patient.  Results were reviewed/authorized by Cloyde Reams, RN, BSN.  She was notified by Cloyde Reams RN.         Prior Anticoagulation Instructions: INR 1.9  Take 10mg  x 2 doses then resume same dosage 7.5mg  daily except 10mg  on Wednesdays.  Recheck in 3 weeks.    Current Anticoagulation Instructions: INR 1.8  Take 13.47me today then start taking 7.5mg  daily except 10mg  on Wednesdays and  Saturdays.  Recheck in 3 weeks.

## 2010-06-09 NOTE — Medication Information (Signed)
Summary: rov/ewj  Anticoagulant Therapy  Managed by: Bethena Midget, RN, BSN Referring MD: D.Bensimhon PCP: Marisue Ivan  MD Supervising MD: Jens Som MD, Arlys John Indication 1: Aortic Valve Replacement (ICD-V43.3) Indication 2: 6 months therapy (ICD-6) Lab Used: [facility] Site: [facility] INR POC 2.0 INR RANGE 2 - 3  Dietary changes: no    Health status changes: no    Bleeding/hemorrhagic complications: no    Recent/future hospitalizations: no    Any changes in medication regimen? no    Recent/future dental: no  Any missed doses?: no       Is patient compliant with meds? yes       Allergies: 1)  ! Vancomycin 2)  ! Penicillin 3)  ! Sulfa 4)  ! Hydralazine Hcl  Anticoagulation Management History:      The patient is taking warfarin and comes in today for a routine follow up visit.  Negative risk factors for bleeding include an age less than 35 years old.  The bleeding index is 'low risk'.  Positive CHADS2 values include History of HTN.  Negative CHADS2 values include Age > 46 years old.  The start date was 08/23/1997.  Her last INR was 7.8 ratio.  Anticoagulation responsible provider: Jens Som MD, Arlys John.  INR POC: 2.0.  Cuvette Lot#: [id number].  Exp: 10/2010.    Anticoagulation Management Assessment/Plan:      The patient's current anticoagulation dose is Warfarin sodium 7.5 mg tabs: Use as directed by Anticoagulation Clinic, Warfarin sodium 10 mg tabs: Take as directed per coumadin clinic..  The target INR is 2 - 3.  The next INR is due 09/30/2009.  Anticoagulation instructions were given to patient.  Results were reviewed/authorized by Bethena Midget, RN, BSN.  She was notified by Bethena Midget, RN, BSN.         Prior Anticoagulation Instructions: INR 1.8  Take 13.59me today then start taking 7.5mg  daily except 10mg  on Wednesdays and Saturdays.  Recheck in 3 weeks.    Current Anticoagulation Instructions: INR 2.0 Tomorrow take 10mg s then Continue 7.5mg s daily except  10mg s on Wednesdays and Saturdays. Recheck in 3 weeks.

## 2010-06-09 NOTE — Medication Information (Signed)
Summary: rov/ewj  Anticoagulant Therapy  Managed by: Cloyde Reams, RN, BSN Referring MD: D.Bensimhon PCP: Marisue Ivan  MD Supervising MD: Eden Emms MD,Peter Indication 1: Aortic Valve Replacement (ICD-V43.3) Indication 2: 6 months therapy (ICD-6) Lab Used: [facility] Site: [facility] INR POC 2.3 INR RANGE 2 - 3  Dietary changes: no    Health status changes: no    Bleeding/hemorrhagic complications: no    Recent/future hospitalizations: no    Any changes in medication regimen? no    Recent/future dental: no  Any missed doses?: no       Is patient compliant with meds? yes       Allergies: 1)  ! Vancomycin 2)  ! Penicillin 3)  ! Sulfa 4)  ! Hydralazine Hcl  Anticoagulation Management History:      The patient is taking warfarin and comes in today for a routine follow up visit.  Negative risk factors for bleeding include an age less than 29 years old.  The bleeding index is 'low risk'.  Positive CHADS2 values include History of HTN.  Negative CHADS2 values include Age > 45 years old.  The start date was 08/23/1997.  Her last INR was 7.8 ratio and today's INR is 2.3.  Anticoagulation responsible provider: Eden Emms MD,Peter.  INR POC: 2.3.  Cuvette Lot#: [id number].  Exp: 02/2011.    Anticoagulation Management Assessment/Plan:      The patient's current anticoagulation dose is Warfarin sodium 7.5 mg tabs: Use as directed by Anticoagulation Clinic, Warfarin sodium 10 mg tabs: Take as directed per coumadin clinic..  The target INR is 2 - 3.  The next INR is due 02/10/2010.  Anticoagulation instructions were given to patient.  Results were reviewed/authorized by Cloyde Reams, RN, BSN.  She was notified by Cloyde Reams RN.         Prior Anticoagulation Instructions: INR 2.2  Continue on same dosage 7.5mg  daily except 10mg  on Mondays, Wednesdays, and Saturdays.  Recheck in 4 weeks.    Current Anticoagulation Instructions: INR 2.3 Continue same dosage of 7.5 mg of sunday,  tuesday, thursday, and friday. 10 mg on monday, wednesday, and saturday. No changes.

## 2010-06-09 NOTE — Progress Notes (Signed)
Summary: labs needed prior to appt   Phone Note Call from Patient Call back at Work Phone [phone]   Caller: Patient Reason for Call: Talk to Nurse Summary of Call: Patient would like to have labs drawn prior to appt.  Please call to make arrangements. Initial call taken by: Burnard Leigh,  Sep 22, 2009 2:09 PM  Follow-up for Phone Call        [facility]. Sherri Rad, RN, BSN  Sep 22, 2009 2:22 PM  I have spoken with the pt. She will come on 09/24/09 for labs. I have told her to come fasting. I will clarify with Dr. Gala Romney all she needs to have drawn. Sherri Rad, RN, BSN  Sep 22, 2009 2:26 PM   Additional Follow-up for Phone Call Additional follow up Details #1::        CMET, LIPIDS, CBC    thanks  Dolores Patty, MD, [facility]  Sep 22, 2009 4:18 PM  Labs put in [place].  Additional Follow-up by: Sherri Rad, RN, BSN,  Sep 23, 2009 9:08 AM

## 2010-06-09 NOTE — Medication Information (Signed)
Summary: rov/sl  Anticoagulant Therapy  Managed by: Bethena Midget, RN, BSN Referring MD: D.Bensimhon PCP: Marisue Ivan  MD Supervising MD: Riley Kill MD, Maisie Fus Indication 1: Aortic Valve Replacement (ICD-V43.3) Indication 2: 6 months therapy (ICD-6) Lab Used: [facility] Site: [facility] INR POC 2.3 INR RANGE 2 - 3  Dietary changes: no    Health status changes: no    Bleeding/hemorrhagic complications: no    Recent/future hospitalizations: no    Any changes in medication regimen? no    Recent/future dental: no  Any missed doses?: no       Is patient compliant with meds? yes       Allergies: 1)  ! Vancomycin 2)  ! Penicillin 3)  ! Sulfa 4)  ! Hydralazine Hcl  Anticoagulation Management History:      The patient is taking warfarin and comes in today for a routine follow up visit.  Negative risk factors for bleeding include an age less than 48 years old.  The bleeding index is 'low risk'.  Positive CHADS2 values include History of HTN.  Negative CHADS2 values include Age > 66 years old.  The start date was 08/23/1997.  Her last INR was 2.3.  Anticoagulation responsible provider: Riley Kill MD, Maisie Fus.  INR POC: 2.3.  Cuvette Lot#: [id number].  Exp: 03/2011.    Anticoagulation Management Assessment/Plan:      The patient's current anticoagulation dose is Warfarin sodium 7.5 mg tabs: Use as directed by Anticoagulation Clinic, Warfarin sodium 10 mg tabs: Take as directed per coumadin clinic..  The target INR is 2 - 3.  The next INR is due 04/08/2010.  Anticoagulation instructions were given to patient.  Results were reviewed/authorized by Bethena Midget, RN, BSN.  She was notified by Bethena Midget, RN, BSN.         Prior Anticoagulation Instructions: INR 2.1  Continue taking Coumadin 10 mg on Mon, Wed, Sat and Coumadin 7.5 mg on Sun, Tues, Thur, Fri. Return to clinic in 4 weeks.   Current Anticoagulation Instructions: INR 2.3 Continue 7.5mg s everyday except 10mg s on Mondays,  Wednesdays and Saturdays. REcheck in 4 weeks.

## 2010-06-09 NOTE — Progress Notes (Signed)
Summary: re lab work  Phone Note Call from Patient   Caller: Patient [phone] Reason for Call: Talk to Nurse Summary of Call: pt called re if when needed labs work, saw letter from [facility] for lipid, pt also wants to know if needs liver and b met as well? send back to me and i'll be glad to call her and set it up Initial call taken by: Glynda Jaeger,  April 16, 2010 8:39 AM  Follow-up for Phone Call        Phone Call Completed PT AWARE WILL COME IN EARLY NEXT WEEK FOR REPEAT LIPID AND LIVER./ Follow-up by: Scherrie Bateman, LPN,  April 16, 2010 8:58 AM

## 2010-06-09 NOTE — Medication Information (Signed)
Summary: rov/tm  Anticoagulant Therapy  Managed by: Cloyde Reams, RN, BSN Referring MD: D.Bensimhon PCP: Marisue Ivan  MD Supervising MD: Graciela Husbands MD, Viviann Spare Indication 1: Aortic Valve Replacement (ICD-V43.3) Indication 2: 6 months therapy (ICD-6) Lab Used: [facility] Site: [facility] INR POC 1.7 INR RANGE 2 - 3  Dietary changes: no    Health status changes: no    Bleeding/hemorrhagic complications: no    Recent/future hospitalizations: no    Any changes in medication regimen? no    Recent/future dental: no  Any missed doses?: no       Is patient compliant with meds? yes       Allergies: 1)  ! Vancomycin 2)  ! Penicillin 3)  ! Sulfa 4)  ! Hydralazine Hcl  Anticoagulation Management History:      The patient is taking warfarin and comes in today for a routine follow up visit.  Negative risk factors for bleeding include an age less than 48 years old.  The bleeding index is 'low risk'.  Positive CHADS2 values include History of HTN.  Negative CHADS2 values include Age > 56 years old.  The start date was 08/23/1997.  Her last INR was 7.8 ratio.  Anticoagulation responsible provider: Graciela Husbands MD, Viviann Spare.  INR POC: 1.7.  Exp: 01/2011.    Anticoagulation Management Assessment/Plan:      The patient's current anticoagulation dose is Warfarin sodium 7.5 mg tabs: Use as directed by Anticoagulation Clinic, Warfarin sodium 10 mg tabs: Take as directed per coumadin clinic..  The target INR is 2 - 3.  The next INR is due 12/16/2009.  Anticoagulation instructions were given to patient.  Results were reviewed/authorized by Cloyde Reams, RN, BSN.  She was notified by Cloyde Reams RN.         Prior Anticoagulation Instructions: INR 2.1 Continue 7.5mg s daily except 10mg s on Wednesdays and Saturdays. Recheck in 4 weeks.   Current Anticoagulation Instructions: INR 1.7  Take 10mg  today, then start taking 7.5mg  daily except 10mg  on Mondays, Wednesdays, and Saturdays. Recheck in 3  weeks.

## 2010-06-09 NOTE — Medication Information (Signed)
Summary: rov/ewj  Anticoagulant Therapy  Managed by: Eda Keys, PharmD Referring MD: D.Bensimhon PCP: Marisue Ivan  MD Supervising MD: Eden Emms MD, Theron Arista Indication 1: Aortic Valve Replacement (ICD-V43.3) Indication 2: 6 months therapy (ICD-6) Lab Used: [facility] Site: [facility] INR POC 1.5 INR RANGE 2 - 3  Dietary changes: yes       Details: Pt has been eating more greens since Jan  Health status changes: no    Bleeding/hemorrhagic complications: no    Recent/future hospitalizations: no    Any changes in medication regimen? no    Recent/future dental: no  Any missed doses?: no       Is patient compliant with meds? yes       Allergies: 1)  ! Vancomycin 2)  ! Penicillin 3)  ! Sulfa 4)  ! Hydralazine Hcl  Anticoagulation Management History:      The patient is taking warfarin and comes in today for a routine follow up visit.  Negative risk factors for bleeding include an age less than 58 years old.  The bleeding index is 'low risk'.  Positive CHADS2 values include History of HTN.  Negative CHADS2 values include Age > 84 years old.  The start date was 08/23/1997.  Her last INR was 7.8 ratio.  Anticoagulation responsible provider: Eden Emms MD, Theron Arista.  INR POC: 1.5.  Cuvette Lot#: [id number].  Exp: 09/2010.    Anticoagulation Management Assessment/Plan:      The patient's current anticoagulation dose is Warfarin sodium 7.5 mg tabs: Use as directed by Anticoagulation Clinic, Warfarin sodium 10 mg tabs: Take as directed per coumadin clinic..  The target INR is 2 - 3.  The next INR is due 07/30/2009.  Anticoagulation instructions were given to patient.  Results were reviewed/authorized by Eda Keys, PharmD.  She was notified by Eda Keys.         Prior Anticoagulation Instructions: INR 2.0  Take 7.5mg  tomorrow then resume same dosage 7.5mg  daily except 5mg  on Wednesdays.  Recheck in 4 weeks.    Current Anticoagulation Instructions: INR 1.5  Start NEW  dosing schedule of 10 mg on Wednesday and 7.5 mg all other days.  Return to clinic in 2 weeks.   Prescriptions: WARFARIN SODIUM 10 MG TABS (WARFARIN SODIUM) Take as directed per coumadin clinic.  #30 x 1   Entered by:   Eda Keys   Authorized by:   Dolores Patty, MD, [facility]   Signed by:   Eda Keys on 07/16/2009   Method used:   Electronically to        [facility] (retail)       [address]       [place], [place]  [id number]       Ph: [phone]       Fax: [phone]   RxID:   [id number]

## 2010-06-09 NOTE — Medication Information (Signed)
Summary: rov/mw  Anticoagulant Therapy  Managed by: Reina Fuse, PharmD Referring MD: D.Bensimhon PCP: Marisue Ivan  MD Supervising MD: Jens Som MD, Arlys John Indication 1: Aortic Valve Replacement (ICD-V43.3) Indication 2: 6 months therapy (ICD-6) Lab Used: [facility] Site: [facility] INR POC 2.1 INR RANGE 2 - 3  Dietary changes: no    Health status changes: no    Bleeding/hemorrhagic complications: no    Recent/future hospitalizations: no    Any changes in medication regimen? no    Recent/future dental: no  Any missed doses?: no       Is patient compliant with meds? yes       Current Medications (verified): 1)  Warfarin Sodium 7.5 Mg Tabs (Warfarin Sodium) .... Use As Directed By Anticoagulation Clinic 2)  Aspirin 81 Mg Tbec (Aspirin) .... Take 1 Tablet By Mouth Once A Day 3)  Simvastatin 40 Mg Tabs (Simvastatin) .... Take One Tablet By Mouth Daily At Bedtime 4)  Calcium Carbonate   Powd (Calcium Carbonate) .... Once Daily As Needed 5)  Vitamin D 1000 Unit  Tabs (Cholecalciferol) .... Once Daily As Needed 6)  Co Q-10 30 Mg  Caps (Coenzyme Q10) .... Once Daily As Needed 7)  Fish Oil   Oil (Fish Oil) .... 3 Grams Daily 8)  Silymarin  Caps (Milk Thistle-Turmeric) .... Once Daily As Needed 9)  Carvedilol 3.125 Mg Tabs (Carvedilol) .... Take One Tablet By Mouth Twice A Day 10)  Warfarin Sodium 10 Mg Tabs (Warfarin Sodium) .... Take As Directed Per Coumadin Clinic.  Allergies (verified): 1)  ! Vancomycin 2)  ! Penicillin 3)  ! Sulfa 4)  ! Hydralazine Hcl  Anticoagulation Management History:      Negative risk factors for bleeding include an age less than 17 years old.  The bleeding index is 'low risk'.  Positive CHADS2 values include History of HTN.  Negative CHADS2 values include Age > 14 years old.  The start date was 08/23/1997.  Her last INR was 2.3.  Anticoagulation responsible provider: Jens Som MD, Arlys John.  INR POC: 2.1.  Exp: 02/2011.    Anticoagulation  Management Assessment/Plan:      The patient's current anticoagulation dose is Warfarin sodium 7.5 mg tabs: Use as directed by Anticoagulation Clinic, Warfarin sodium 10 mg tabs: Take as directed per coumadin clinic..  The target INR is 2 - 3.  The next INR is due 03/12/2010.  Anticoagulation instructions were given to patient.  Results were reviewed/authorized by Reina Fuse, PharmD.  She was notified by Reina Fuse PharmD.         Prior Anticoagulation Instructions: INR 2.3 Continue same dosage of 7.5 mg of sunday, tuesday, thursday, and friday. 10 mg on monday, wednesday, and saturday. No changes.   Current Anticoagulation Instructions: INR 2.1  Continue taking Coumadin 10 mg on Mon, Wed, Sat and Coumadin 7.5 mg on Sun, Tues, Thur, Fri. Return to clinic in 4 weeks.

## 2010-06-09 NOTE — Progress Notes (Signed)
Summary: question regardign blood results - white count  Phone Note Call from Patient Call back at Home Phone [phone] Call back at [phone]   Caller: Patient Reason for Call: Talk to Nurse Summary of Call: has question regarding blood results. white count. Initial call taken by: Lorne Skeens,  Oct 01, 2009 3:15 PM  Follow-up for Phone Call        Left message to call back Meredith Staggers, RN  Oct 01, 2009 3:21 PM   spoke w/pt Meredith Staggers, RN  Oct 01, 2009 3:38 PM

## 2010-06-09 NOTE — Medication Information (Signed)
Summary: rov/ewj  Anticoagulant Therapy  Managed by: Cloyde Reams, RN, BSN Referring MD: D.Bensimhon PCP: Marisue Ivan  MD Supervising MD: Daleen Squibb MD, Maisie Fus Indication 1: Aortic Valve Replacement (ICD-V43.3) Indication 2: 6 months therapy (ICD-6) Lab Used: [facility] Site: [facility] INR POC 2.2 INR RANGE 2 - 3  Dietary changes: no    Health status changes: no    Bleeding/hemorrhagic complications: no    Recent/future hospitalizations: no    Any changes in medication regimen? no    Recent/future dental: no  Any missed doses?: no       Is patient compliant with meds? yes       Allergies: 1)  ! Vancomycin 2)  ! Penicillin 3)  ! Sulfa 4)  ! Hydralazine Hcl  Anticoagulation Management History:      The patient is taking warfarin and comes in today for a routine follow up visit.  Negative risk factors for bleeding include an age less than 9 years old.  The bleeding index is 'low risk'.  Positive CHADS2 values include History of HTN.  Negative CHADS2 values include Age > 53 years old.  The start date was 08/23/1997.  Her last INR was 7.8 ratio.  Anticoagulation responsible provider: Daleen Squibb MD, Maisie Fus.  INR POC: 2.2.  Cuvette Lot#: [id number].  Exp: 02/2011.    Anticoagulation Management Assessment/Plan:      The patient's current anticoagulation dose is Warfarin sodium 7.5 mg tabs: Use as directed by Anticoagulation Clinic, Warfarin sodium 10 mg tabs: Take as directed per coumadin clinic..  The target INR is 2 - 3.  The next INR is due 01/13/2010.  Anticoagulation instructions were given to patient.  Results were reviewed/authorized by Cloyde Reams, RN, BSN.  She was notified by Cloyde Reams, RN, BSN.         Prior Anticoagulation Instructions: INR 1.7  Take 10mg  today, then start taking 7.5mg  daily except 10mg  on Mondays, Wednesdays, and Saturdays. Recheck in 3 weeks.    Current Anticoagulation Instructions: INR 2.2  Continue on same dosage 7.5mg  daily except 10mg   on Mondays, Wednesdays, and Saturdays.  Recheck in 4 weeks.

## 2010-06-09 NOTE — Assessment & Plan Note (Signed)
Summary: per call in/saf   Visit Type:  Follow-up Primary Provider:  Marisue Ivan  MD  CC:  no complaints.  History of Present Illness: 55 year old Caucasian female with history of HTN, HL, obesity, thoracic aneurysm and bicuspid Aov s/p mechanical aortic valve replacement on chronic Coumadin. Admitted in summer of 2010 for CP, ruled out. F/u stress test July 2010 showing no evidence of ischemia.  Here for f/u. Doing well. Walks her dog regularly without any CP or SOB. Was having hard time losing weight worried about beta-blocker affecting it and has done some research on it and wondering if she can switch from lopressor to carvedilol to improve glucose handling. Going to [employer]. No swelling. Occasional brief runs of palpitations.  Thoracic aneurysm followed by Dr. Mason Jim by recent MRI.   Recent labs: Glucose 99 TC 167, TG 65 HDL 46 LDL 108. On simva 20.        Current Medications (verified): 1)  Warfarin Sodium 7.5 Mg Tabs (Warfarin Sodium) .... Use As Directed By Anticoagulation Clinic 2)  Aspirin 81 Mg Tbec (Aspirin) .... Take 1 Tablet By Mouth Once A Day 3)  Simvastatin 20 Mg Tabs (Simvastatin) .... Take One Tablet By Mouth Daily At Bedtime 4)  Calcium Carbonate   Powd (Calcium Carbonate) .... Once Daily As Needed 5)  Vitamin D 1000 Unit  Tabs (Cholecalciferol) .... Once Daily As Needed 6)  Co Q-10 30 Mg  Caps (Coenzyme Q10) .... Once Daily As Needed 7)  Fish Oil   Oil (Fish Oil) .... Once Daily As Needed 8)  Silymarin  Caps (Milk Thistle-Turmeric) .... Once Daily As Needed 9)  Metoprolol Succinate 25 Mg Xr24h-Tab (Metoprolol Succinate) .Marland Kitchen.. 1 Once Daily 10)  Warfarin Sodium 10 Mg Tabs (Warfarin Sodium) .... Take As Directed Per Coumadin Clinic.  Allergies (verified): 1)  ! Vancomycin 2)  ! Penicillin 3)  ! Sulfa 4)  ! Hydralazine Hcl  Past History:  Past Medical History: Last updated: 11/15/2008 H/O BICUSPID AO VALVE      a. s/p Mech AVR - on chronic  coumadin HYPERLIPIDEMIA-MIXED (ICD-272.4) OVERWEIGHT/OBESITY (ICD-278.02) HYPERTENSION, UNSPECIFIED (ICD-401.9) THORACIC AORTIC ANEURYSM (ICD-441.2) VACCINE AGAINST INFLUENZA (ICD-V04.81) AORTIC STENOSIS (ICD-747.2) ANXIETY    Family History: Reviewed history from 07/07/2006 and no changes required. Mom died 35yo w/ Alzheimer`s, onset at 43 and epilepsy.  Dad died at 48yo of vascular disease and lung disease and possible CHF.  Two brothers 43, 48 healthy. GF died 59s of MI.  Social History: Reviewed history from 12/18/2008 and no changes required. Lives w/ husband.  Has 40 yr old son.   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  Walks with walking group at work, and on weekends.  No tob, etoh, drugs. Best contact #s- [phone] work, [phone] home, [phone] cell  Review of Systems       As per HPI and past medical history; otherwise all systems negative.   Vital Signs:  Patient profile:   55 year old female Height:      65 inches Weight:      189 pounds BMI:     31.56 Pulse rate:   83 / minute BP sitting:   120 / 66  (left arm) Cuff size:   regular  Vitals Entered By: Hardin Negus, RMA (Sep 30, 2009 9:48 AM)  Physical Exam  General:  Gen: well appearing. no resp difficulty HEENT: normal Neck: supple. no JVD. Carotids 2+ bilat; no bruits. No lymphadenopathy or thryomegaly appreciated. Cor: PMI nondisplaced. Regular rate &  rhythm. No rubs, gallops, murmur. mechanical s2 Lungs: clear Abdomen: soft, nontender, nondistended.Peri Jefferson bowel sounds. Extremities: no cyanosis, clubbing, rash, edema Neuro: alert & orientedx3, cranial nerves grossly intact. moves all 4 extremities w/o difficulty. affect pleasant    Impression & Recommendations:  Problem # 1:  PALPITATIONS (ICD-785.1) Likely benign. Check 48 hour monitor to evaluate further. Can titrate b-blocker as needed. Avoid caffeine.   Problem # 2:  HEART VALVE REPLACED BY OTHER MEANS (ICD-V43.3) Sounds good on exam. Due for  f/u echo.   Problem # 3:  HYPERLIPIDEMIA-MIXED (ICD-272.4) Looks pretty good. Given thoracic aneuryms favor getting her LDL < 70. SUggested increasing simva to 40 but she was a bit reluctant. She is very focused on her HDL. Will increase fish oil to 3g/day and recheck in 3 months.   Problem # 4:  THORACIC AORTIC ANEURYSM (ICD-441.2) Agree with need for beta-blocker. Will switch Toprol to coreg 3.125 two times a day at her request.   Other Orders: EKG w/ Interpretation (93000) Echocardiogram (Echo) Holter (Holter)  CHF Assessment/Plan:      The patient's current weight is 189 pounds.  Her previous weight was 198.5 pounds.    Patient Instructions: 1)  Increase Fish oil to 3grams daily 2)  Stop Metoprolol 3)  Start Carvedilol 3.125mg  two times a day  4)  Your physician has requested that you have an echocardiogram.  Echocardiography is a painless test that uses sound waves to create images of your heart. It provides your doctor with information about the size and shape of your heart and how well your heart's chambers and valves are working.  This procedure takes approximately one hour. There are no restrictions for this procedure. 5)  Your physician has recommended that you wear a holter monitor.  Holter monitors are medical devices that record the heart's electrical activity. Doctors most often use these monitors to diagnose arrhythmias. Arrhythmias are problems with the speed or rhythm of the heartbeat. The monitor is a small, portable device. You can wear one while you do your normal daily activities. This is usually used to diagnose what is causing palpitations/syncope (passing out).] 6)  Follow up in 1 year Prescriptions: CARVEDILOL 3.125 MG TABS (CARVEDILOL) Take one tablet by mouth twice a day  #60 x 12   Entered by:   Meredith Staggers, RN   Authorized by:   Dolores Patty, MD, [facility]   Signed by:   Meredith Staggers, RN on 09/30/2009   Method used:   Electronically to        [facility] (retail)       [address]       [place], [place]  [id number]       Ph: [phone]       Fax: [phone]   RxID:   [id number]

## 2010-06-09 NOTE — Letter (Signed)
Summary: Lipid reminder  [facility], Main Office  [address]   [place], [place] [address]   Phone: [phone]  Fax: [phone]        April 14, 2010 MRN: [id number]    [facility] [address] [place], [place]  [address]    Dear Ms. Bunt,  Our records indicate it is time to check your cholesterol, due to your medication change in September.  Please call our office to schedule an appt for labwork.  Please remember it is a fasting lab.     Sincerely,  Meredith Staggers, RN  This letter has been electronically signed by your physician.

## 2010-06-09 NOTE — Procedures (Signed)
Summary: Summary Report  Summary Report   Imported By: Erle Crocker 11/20/2009 11:01:12  _____________________________________________________________________  External Attachment:    Type:   Image     Comment:   External Document

## 2010-06-09 NOTE — Medication Information (Signed)
Summary: rov/tm  Anticoagulant Therapy  Managed by: Cloyde Reams, RN, BSN Referring MD: D.Bensimhon PCP: Marisue Ivan  MD Supervising MD: Riley Kill MD, Maisie Fus Indication 1: Aortic Valve Replacement (ICD-V43.3) Indication 2: 6 months therapy (ICD-6) Lab Used: [facility] Site: [facility] INR POC 2.0 INR RANGE 2 - 3  Dietary changes: no    Health status changes: no    Bleeding/hemorrhagic complications: no    Recent/future hospitalizations: no    Any changes in medication regimen? no    Recent/future dental: no  Any missed doses?: no       Is patient compliant with meds? yes       Allergies: 1)  ! Vancomycin 2)  ! Penicillin 3)  ! Sulfa 4)  ! Hydralazine Hcl  Anticoagulation Management History:      The patient is taking warfarin and comes in today for a routine follow up visit.  Negative risk factors for bleeding include an age less than 26 years old.  The bleeding index is 'low risk'.  Positive CHADS2 values include History of HTN.  Negative CHADS2 values include Age > 80 years old.  The start date was 08/23/1997.  Her last INR was 7.8 ratio.  Anticoagulation responsible provider: Riley Kill MD, Maisie Fus.  INR POC: 2.0.  Cuvette Lot#: [id number].  Exp: 08/2010.    Anticoagulation Management Assessment/Plan:      The patient's current anticoagulation dose is Warfarin sodium 7.5 mg tabs: Use as directed by Anticoagulation Clinic.  The target INR is 2 - 3.  The next INR is due 07/15/2009.  Anticoagulation instructions were given to patient.  Results were reviewed/authorized by Cloyde Reams, RN, BSN.  She was notified by Cloyde Reams RN.         Prior Anticoagulation Instructions: INR 2.4  Continue same dose of 7.5mg  daily except 5mg  on Wednesdays. Recheck in 4 weeks.   Current Anticoagulation Instructions: INR 2.0  Take 7.5mg  tomorrow then resume same dosage 7.5mg  daily except 5mg  on Wednesdays.  Recheck in 4 weeks.

## 2010-06-09 NOTE — Medication Information (Signed)
Summary: rov/tm  Anticoagulant Therapy  Managed by: Weston Brass, PharmD Referring MD: D.Iasia Forcier PCP: Marisue Ivan  MD Supervising MD: Gala Romney MD, Reuel Boom Indication 1: Aortic Valve Replacement (ICD-V43.3) Indication 2: 6 months therapy (ICD-6) Lab Used: [facility] Site: [facility] INR POC 2.4 INR RANGE 2 - 3  Dietary changes: no    Health status changes: no    Bleeding/hemorrhagic complications: no    Recent/future hospitalizations: no    Any changes in medication regimen? no    Recent/future dental: no  Any missed doses?: no       Is patient compliant with meds? yes       Allergies: 1)  ! Vancomycin 2)  ! Penicillin 3)  ! Sulfa 4)  ! Hydralazine Hcl  Anticoagulation Management History:      The patient is taking warfarin and comes in today for a routine follow up visit.  Negative risk factors for bleeding include an age less than 90 years old.  The bleeding index is 'low risk'.  Positive CHADS2 values include History of HTN.  Negative CHADS2 values include Age > 49 years old.  The start date was 08/23/1997.  Her last INR was 2.3.  Anticoagulation responsible provider: Deeksha Cotrell MD, Reuel Boom.  INR POC: 2.4.  Cuvette Lot#: [id number].  Exp: 04/2011.    Anticoagulation Management Assessment/Plan:      The patient's current anticoagulation dose is Warfarin sodium 7.5 mg tabs: Use as directed by Anticoagulation Clinic, Warfarin sodium 10 mg tabs: Take as directed per coumadin clinic..  The target INR is 2 - 3.  The next INR is due 05/06/2010.  Anticoagulation instructions were given to patient.  Results were reviewed/authorized by Weston Brass, PharmD.  She was notified by Weston Brass PharmD.         Prior Anticoagulation Instructions: INR 2.3 Continue 7.5mg s everyday except 10mg s on Mondays, Wednesdays and Saturdays. REcheck in 4 weeks.   Current Anticoagulation Instructions: INR 2.4  Continue same dose of 7.5mg  daily except 10mg  on Monday, Wednesday and Saturday.   Recheck INR in 4 weeks.

## 2010-06-09 NOTE — Medication Information (Signed)
Summary: rov/eac  Anticoagulant Therapy  Managed by: Cloyde Reams, RN, BSN Referring MD: D.Bensimhon PCP: Marisue Ivan  MD Supervising MD: Johney Frame MD, Fayrene Fearing Indication 1: Aortic Valve Replacement (ICD-V43.3) Indication 2: 6 months therapy (ICD-6) Lab Used: [facility] Site: [facility] INR POC 1.9 INR RANGE 2 - 3  Dietary changes: no    Health status changes: no    Bleeding/hemorrhagic complications: no    Recent/future hospitalizations: no    Any changes in medication regimen? no    Recent/future dental: no  Any missed doses?: no       Is patient compliant with meds? yes       Allergies: 1)  ! Vancomycin 2)  ! Penicillin 3)  ! Sulfa 4)  ! Hydralazine Hcl  Anticoagulation Management History:      The patient is taking warfarin and comes in today for a routine follow up visit.  Negative risk factors for bleeding include an age less than 64 years old.  The bleeding index is 'low risk'.  Positive CHADS2 values include History of HTN.  Negative CHADS2 values include Age > 32 years old.  The start date was 08/23/1997.  Her last INR was 7.8 ratio.  Anticoagulation responsible provider: Theodus Ran MD, Fayrene Fearing.  INR POC: 1.9.  Cuvette Lot#: [id number].  Exp: 09/2010.    Anticoagulation Management Assessment/Plan:      The patient's current anticoagulation dose is Warfarin sodium 7.5 mg tabs: Use as directed by Anticoagulation Clinic, Warfarin sodium 10 mg tabs: Take as directed per coumadin clinic..  The target INR is 2 - 3.  The next INR is due 08/20/2009.  Anticoagulation instructions were given to patient.  Results were reviewed/authorized by Cloyde Reams, RN, BSN.  She was notified by Cloyde Reams RN.         Prior Anticoagulation Instructions: INR 1.5  Start NEW dosing schedule of 10 mg on Wednesday and 7.5 mg all other days.  Return to clinic in 2 weeks.    Current Anticoagulation Instructions: INR 1.9  Take 10mg  x 2 doses then resume same dosage 7.5mg  daily except  10mg  on Wednesdays.  Recheck in 3 weeks.

## 2010-06-09 NOTE — Letter (Signed)
Summary: Lipid Reminder  [facility], Main Office  [address]   [place], [place] [address]   Phone: [phone]  Fax: [phone]        December 16, 2009 MRN: [id number]    [facility] [address] [place], [place]  [address]    Dear Ms. Grabill,  Our records indicate it is time to check your cholesterol.  Please call our office to schedule an appt for labwork.  Please remember it is a fasting lab.     Sincerely,  Meredith Staggers, RN Arvilla Meres, MD  This letter has been electronically signed by your physician.

## 2010-06-09 NOTE — Medication Information (Signed)
Summary: rov/tm  Anticoagulant Therapy  Managed by: Eda Keys, PharmD Referring MD: D.Lima Chillemi PCP: Marisue Ivan  MD Supervising MD: Gala Romney MD, Reuel Boom Indication 1: Aortic Valve Replacement (ICD-V43.3) Indication 2: 6 months therapy (ICD-6) Lab Used: [facility] Site: [address] [facility] INR RANGE 2 - 3  Dietary changes: no    Health status changes: no    Bleeding/hemorrhagic complications: no    Recent/future hospitalizations: no    Any changes in medication regimen? no    Recent/future dental: no  Any missed doses?: no       Is patient compliant with meds? yes       Allergies: 1)  ! Vancomycin 2)  ! Penicillin 3)  ! Sulfa 4)  ! Hydralazine Hcl  Anticoagulation Management History:      The patient is taking warfarin and comes in today for a routine follow up visit.  Negative risk factors for bleeding include an age less than 90 years old.  The bleeding index is 'low risk'.  Positive CHADS2 values include History of HTN.  Negative CHADS2 values include Age > 25 years old.  The start date was 08/23/1997.  Her last INR was 7.8 ratio.  Anticoagulation responsible provider: Kahealani Yankovich MD, Reuel Boom.  Cuvette Lot#: [id number].  Exp: 12/2010.    Anticoagulation Management Assessment/Plan:      The patient's current anticoagulation dose is Warfarin sodium 7.5 mg tabs: Use as directed by Anticoagulation Clinic, Warfarin sodium 10 mg tabs: Take as directed per coumadin clinic..  The target INR is 2 - 3.  The next INR is due 10/28/2009.  Anticoagulation instructions were given to patient.  Results were reviewed/authorized by Eda Keys, PharmD.  She was notified by Eda Keys.         Prior Anticoagulation Instructions: INR 2.0 Tomorrow take 10mg s then Continue 7.5mg s daily except 10mg s on Wednesdays and Saturdays. Recheck in 3 weeks.   Current Anticoagulation Instructions: INR 2.1  Continue taking 10 mg on Wednesday and Saturday and take 7.5 mg all other days.   Return to clinic in 4 weeks.

## 2010-06-11 NOTE — Assessment & Plan Note (Signed)
Summary: PER CHECK OUT/SF   Visit Type:  Follow-up Primary Provider:  Marisue Ivan  MD   History of Present Illness: Monica Key is a 55 year old Caucasian female with history of HTN, HL, obesity, thoracic aneurysm and bicuspid Aov s/p mechanical aortic valve replacement on chronic Coumadin. Admitted in summer of 2010 for CP, ruled out. F/u stress test July 2010 showing no evidence of ischemia.  Here for f/u. Doing well. Walks her dog regularly without any CP or SOB. At last visit we switched her from Toprol to low-dose carvedilo. Tolerating well. Has had two episodes of severe headaches and worried it may be related to her BP. Checked at one drug store and it was 153/90 and at other 113/70.  No other neuro signs. Very worried about her BP.  No swelling.   Thoracic aneurysm followed by Dr. Laneta Simmers stable by recent MRI.         Current Medications (verified): 1)  Warfarin Sodium 7.5 Mg Tabs (Warfarin Sodium) .... Use As Directed By Anticoagulation Clinic 2)  Aspirin 81 Mg Tbec (Aspirin) .... Take 1 Tablet By Mouth Once A Day 3)  Simvastatin 40 Mg Tabs (Simvastatin) .... Take One Tablet By Mouth Daily At Bedtime 4)  Vitamin D 1000 Unit  Tabs (Cholecalciferol) .... Once Daily As Needed 5)  Co Q-10 30 Mg  Caps (Coenzyme Q10) .... Once Daily As Needed 6)  Fish Oil   Oil (Fish Oil) .... 3 Grams Daily 7)  Silymarin  Caps (Milk Thistle-Turmeric) .... Once Daily As Needed 8)  Carvedilol 3.125 Mg Tabs (Carvedilol) .... Take One Tablet By Mouth Twice A Day 9)  Warfarin Sodium 10 Mg Tabs (Warfarin Sodium) .... Take As Directed Per Coumadin Clinic.  Allergies (verified): 1)  ! Vancomycin 2)  ! Penicillin 3)  ! Sulfa 4)  ! Hydralazine Hcl  Past History:  Past Medical History: Last updated: 11/15/2008 H/O BICUSPID AO VALVE      a. s/p Mech AVR - on chronic coumadin HYPERLIPIDEMIA-MIXED (ICD-272.4) OVERWEIGHT/OBESITY (ICD-278.02) HYPERTENSION, UNSPECIFIED (ICD-401.9) THORACIC AORTIC  ANEURYSM (ICD-441.2) VACCINE AGAINST INFLUENZA (ICD-V04.81) AORTIC STENOSIS (ICD-747.2) ANXIETY    Review of Systems       As per HPI and past medical history; otherwise all systems negative.   Vital Signs:  Patient profile:   55 year old female Height:      65 inches Weight:      186 pounds BMI:     31.06 Pulse rate:   67 / minute BP sitting:   132 / 62  (left arm)  Vitals Entered By: Laurance Flatten CMA (April 23, 2010 11:47 AM)  Physical Exam  General:  Well appearing. no resp difficulty HEENT: normal Neck: supple. no JVD. Carotids 2+ bilat; no bruits. No lymphadenopathy or thryomegaly appreciated. Cor: PMI nondisplaced. Regular rate & rhythm. No rubs, gallops, murmur. mechanical s2 Lungs: clear Abdomen: soft, nontender, nondistended.Peri Jefferson bowel sounds. Extremities: no cyanosis, clubbing, rash, edema Neuro: alert & orientedx3, cranial nerves grossly intact. moves all 4 extremities w/o difficulty. affect pleasant    Impression & Recommendations:  Problem # 1:  HYPERTENSION, UNSPECIFIED (ICD-401.9) BP a bit up and down. Have aswked her to get home BP cuff and to keep log of dailty BPs. Will see her back in a month to decide if we need to push therapy and consider addition of ARB such as Benicar or Diovan.   Problem # 2:  HEART VALVE REPLACED BY OTHER MEANS (ICD-V43.3) Stable. Countinue coumadin. Will need f/u echo orderedin  June 2012.   Other Orders: EKG w/ Interpretation & report only (93010)  Patient Instructions: 1)  Your physician recommends that you schedule a follow-up appointment in: 1 month 2)  Your physician recommends that you continue on your current medications as directed. Please refer to the Current Medication list given to you today. 3)  Your physician has requested that you regularly monitor and record your blood pressure readings at home.  Please use the same machine at the same time of day to check your readings and record them to bring to your  follow-up visit.

## 2010-06-11 NOTE — Medication Information (Signed)
Summary: ccr  Anticoagulant Therapy  Managed by: Weston Brass, PharmD Referring MD: D.Bensimhon PCP: Marisue Ivan  MD Supervising MD: Johney Frame MD, Fayrene Fearing Indication 1: Aortic Valve Replacement (ICD-V43.3) Indication 2: 6 months therapy (ICD-6) Lab Used: [facility] Site: [facility] INR POC 2.8 INR RANGE 2 - 3  Dietary changes: no    Health status changes: no    Bleeding/hemorrhagic complications: no    Recent/future hospitalizations: no    Any changes in medication regimen? no    Recent/future dental: no  Any missed doses?: no       Is patient compliant with meds? yes       Allergies: 1)  ! Vancomycin 2)  ! Penicillin 3)  ! Sulfa 4)  ! Hydralazine Hcl  Anticoagulation Management History:      The patient is taking warfarin and comes in today for a routine follow up visit.  Negative risk factors for bleeding include an age less than 15 years old.  The bleeding index is 'low risk'.  Positive CHADS2 values include History of HTN.  Negative CHADS2 values include Age > 46 years old.  The start date was 08/23/1997.  Her last INR was 2.3.  Anticoagulation responsible provider: Arhum Peeples MD, Fayrene Fearing.  INR POC: 2.8.  Cuvette Lot#: [id number].  Exp: 06/2011.    Anticoagulation Management Assessment/Plan:      The patient's current anticoagulation dose is Warfarin sodium 7.5 mg tabs: Use as directed by Anticoagulation Clinic, Warfarin sodium 10 mg tabs: Take as directed per coumadin clinic..  The target INR is 2 - 3.  The next INR is due 06/17/2010.  Anticoagulation instructions were given to patient.  Results were reviewed/authorized by Weston Brass, PharmD.  She was notified by Stephannie Peters, PharmD Candidate .         Prior Anticoagulation Instructions: INR 2.4  Continue same dose of 7.5mg  daily except 10mg  on Monday, Wednesday and Saturday.  Recheck INR in 4 weeks.   Current Anticoagulation Instructions: INR 2.8  Coumadin 7.5 mg tablets - Continue 1 tablet on Sundays, Tuesdays,  Thursdays and Fridays  Coumadin 10 mg tablets - Continue 1 tablet on Mondays, Wednesdays and Saturdays

## 2010-06-17 ENCOUNTER — Encounter: Payer: Self-pay | Admitting: Internal Medicine

## 2010-06-17 ENCOUNTER — Encounter (INDEPENDENT_AMBULATORY_CARE_PROVIDER_SITE_OTHER): Payer: BC Managed Care – PPO

## 2010-06-17 DIAGNOSIS — Z954 Presence of other heart-valve replacement: Secondary | ICD-10-CM

## 2010-06-17 DIAGNOSIS — Z7901 Long term (current) use of anticoagulants: Secondary | ICD-10-CM

## 2010-06-22 ENCOUNTER — Other Ambulatory Visit: Payer: Self-pay | Admitting: Dermatology

## 2010-06-25 NOTE — Medication Information (Signed)
Summary: [facility]  Anticoagulant Therapy  Managed by: Windell Hummingbird, RN Referring MD: D.Bensimhon PCP: Marisue Ivan  MD Supervising MD: Graciela Husbands MD, Viviann Spare Indication 1: Aortic Valve Replacement (ICD-V43.3) Indication 2: 6 months therapy (ICD-6) Lab Used: [facility] Site: [facility] INR POC 2.6 INR RANGE 2 - 3  Dietary changes: no    Health status changes: no    Bleeding/hemorrhagic complications: no    Recent/future hospitalizations: no    Any changes in medication regimen? no    Recent/future dental: no  Any missed doses?: no       Is patient compliant with meds? yes       Allergies: 1)  ! Vancomycin 2)  ! Penicillin 3)  ! Sulfa 4)  ! Hydralazine Hcl  Anticoagulation Management History:      The patient is taking warfarin and comes in today for a routine follow up visit.  Negative risk factors for bleeding include an age less than 40 years old.  The bleeding index is 'low risk'.  Positive CHADS2 values include History of HTN.  Negative CHADS2 values include Age > 63 years old.  The start date was 08/23/1997.  Her last INR was 2.3.  Anticoagulation responsible provider: Graciela Husbands MD, Viviann Spare.  INR POC: 2.6.  Cuvette Lot#: [id number].  Exp: 05/2011.    Anticoagulation Management Assessment/Plan:      The patient's current anticoagulation dose is Warfarin sodium 7.5 mg tabs: Use as directed by Anticoagulation Clinic, Warfarin sodium 10 mg tabs: Take as directed per coumadin clinic..  The target INR is 2 - 3.  The next INR is due 07/15/2010.  Anticoagulation instructions were given to patient.  Results were reviewed/authorized by Windell Hummingbird, RN.  She was notified by Windell Hummingbird, RN.         Prior Anticoagulation Instructions: INR 2.8  Coumadin 7.5 mg tablets - Continue 1 tablet on Sundays, Tuesdays, Thursdays and Fridays  Coumadin 10 mg tablets - Continue 1 tablet on Mondays, Wednesdays and Saturdays   Current Anticoagulation Instructions: INR 2.6 Continue taking  7.5 mg everyday, except take 10 mg on Mondays, Wednesdays, and Saturdays.

## 2010-07-14 ENCOUNTER — Encounter: Payer: Self-pay | Admitting: Internal Medicine

## 2010-07-14 DIAGNOSIS — Z954 Presence of other heart-valve replacement: Secondary | ICD-10-CM

## 2010-07-14 DIAGNOSIS — Q231 Congenital insufficiency of aortic valve: Secondary | ICD-10-CM

## 2010-07-15 ENCOUNTER — Encounter: Payer: Self-pay | Admitting: Internal Medicine

## 2010-07-15 ENCOUNTER — Encounter (INDEPENDENT_AMBULATORY_CARE_PROVIDER_SITE_OTHER): Payer: BC Managed Care – PPO

## 2010-07-15 DIAGNOSIS — Z954 Presence of other heart-valve replacement: Secondary | ICD-10-CM

## 2010-07-15 DIAGNOSIS — Z7901 Long term (current) use of anticoagulants: Secondary | ICD-10-CM

## 2010-07-15 DIAGNOSIS — I359 Nonrheumatic aortic valve disorder, unspecified: Secondary | ICD-10-CM

## 2010-07-15 LAB — CONVERTED CEMR LAB: POC INR: 2.5

## 2010-07-21 NOTE — Medication Information (Signed)
Summary: rov/tm  Anticoagulant Therapy  Managed by: Weston Brass, PharmD Referring MD: D.Bensimhon PCP: Marisue Ivan  MD Supervising MD: Graciela Husbands MD, Viviann Spare Indication 1: Aortic Valve Replacement (ICD-V43.3) Indication 2: 6 months therapy (ICD-6) Lab Used: [facility] Site: [facility] INR POC 2.5 INR RANGE 2 - 3  Dietary changes: no    Health status changes: no    Bleeding/hemorrhagic complications: no    Recent/future hospitalizations: no    Any changes in medication regimen? no    Recent/future dental: no  Any missed doses?: no       Is patient compliant with meds? yes       Allergies: 1)  ! Vancomycin 2)  ! Penicillin 3)  ! Sulfa 4)  ! Hydralazine Hcl  Anticoagulation Management History:      The patient is taking warfarin and comes in today for a routine follow up visit.  Negative risk factors for bleeding include an age less than 49 years old.  The bleeding index is 'low risk'.  Positive CHADS2 values include History of HTN.  Negative CHADS2 values include Age > 100 years old.  The start date was 08/23/1997.  Her last INR was 2.3.  Anticoagulation responsible provider: Graciela Husbands MD, Viviann Spare.  INR POC: 2.5.  Cuvette Lot#: [id number].  Exp: 05/2011.    Anticoagulation Management Assessment/Plan:      The patient's current anticoagulation dose is Warfarin sodium 7.5 mg tabs: Use as directed by Anticoagulation Clinic, Warfarin sodium 10 mg tabs: Take as directed per coumadin clinic..  The target INR is 2 - 3.  The next INR is due 08/12/2010.  Anticoagulation instructions were given to patient.  Results were reviewed/authorized by Weston Brass, PharmD.  She was notified by Weston Brass PharmD.         Prior Anticoagulation Instructions: INR 2.6 Continue taking 7.5 mg everyday, except take 10 mg on Mondays, Wednesdays, and Saturdays.  Current Anticoagulation Instructions: INR 2.5  Continue same dose of 7.5mg  daily except 10mg  on Monday, Wednesday and Saturday.  Recheck INR in 4  weeks.

## 2010-07-27 ENCOUNTER — Other Ambulatory Visit: Payer: Self-pay | Admitting: Surgery

## 2010-07-27 DIAGNOSIS — I712 Thoracic aortic aneurysm, without rupture: Secondary | ICD-10-CM

## 2010-08-12 ENCOUNTER — Ambulatory Visit (INDEPENDENT_AMBULATORY_CARE_PROVIDER_SITE_OTHER): Payer: BC Managed Care – PPO | Admitting: *Deleted

## 2010-08-12 DIAGNOSIS — I359 Nonrheumatic aortic valve disorder, unspecified: Secondary | ICD-10-CM

## 2010-08-12 DIAGNOSIS — Z954 Presence of other heart-valve replacement: Secondary | ICD-10-CM

## 2010-08-12 DIAGNOSIS — Q231 Congenital insufficiency of aortic valve: Secondary | ICD-10-CM

## 2010-08-12 DIAGNOSIS — Z7901 Long term (current) use of anticoagulants: Secondary | ICD-10-CM

## 2010-08-12 LAB — POCT INR: INR: 2.6

## 2010-08-12 NOTE — Patient Instructions (Signed)
INR 2.6  Continue same dose of 7.5mg  daily except 10mg  on Monday, Wednesday and Saturday.  Recheck INR in 4 weeks.

## 2010-08-16 LAB — POCT URINALYSIS DIP (DEVICE)
Ketones, ur: NEGATIVE mg/dL
Protein, ur: NEGATIVE mg/dL
Specific Gravity, Urine: 1.015 (ref 1.005–1.030)
pH: 7 (ref 5.0–8.0)

## 2010-08-17 LAB — CARDIAC PANEL(CRET KIN+CKTOT+MB+TROPI)
CK, MB: 0.8 ng/mL (ref 0.3–4.0)
CK, MB: 0.8 ng/mL (ref 0.3–4.0)
Total CK: 48 U/L (ref 7–177)

## 2010-08-17 LAB — CBC
HCT: 43.4 % (ref 36.0–46.0)
Hemoglobin: 14.9 g/dL (ref 12.0–15.0)
Platelets: 189 10*3/uL (ref 150–400)
WBC: 7.1 10*3/uL (ref 4.0–10.5)

## 2010-08-17 LAB — BASIC METABOLIC PANEL
GFR calc non Af Amer: >60 mL/min (ref >60)
Potassium: 4.3 mEq/L (ref 3.5–5.1)
Sodium: 141 mEq/L (ref 135–145)

## 2010-08-17 LAB — APTT: aPTT: 51 seconds — ABNORMAL HIGH (ref 24–37)

## 2010-08-17 LAB — POCT CARDIAC MARKERS
CKMB, poc: <1 ng/mL — ABNORMAL LOW (ref 1.0–8.0)
Troponin i, poc: <0.05 ng/mL (ref 0.00–0.09)
Troponin i, poc: <0.05 ng/mL (ref 0.00–0.09)

## 2010-08-17 LAB — TSH: TSH: 2.034 u[IU]/mL (ref 0.350–4.500)

## 2010-08-28 ENCOUNTER — Telehealth: Payer: Self-pay | Admitting: Internal Medicine

## 2010-08-28 ENCOUNTER — Other Ambulatory Visit: Payer: Self-pay | Admitting: *Deleted

## 2010-08-28 MED ORDER — SIMVASTATIN 40 MG PO TABS: 40.0000 mg | ORAL_TABLET | Freq: Every day | ORAL | Status: DC

## 2010-08-29 ENCOUNTER — Other Ambulatory Visit: Payer: Self-pay | Admitting: *Deleted

## 2010-09-01 ENCOUNTER — Ambulatory Visit
Admission: RE | Admit: 2010-09-01 | Discharge: 2010-09-01 | Disposition: A | Discharge status: Home / Self Care | Payer: BC Managed Care – PPO | Source: Ambulatory Visit | Attending: Surgery | Admitting: Surgery

## 2010-09-01 ENCOUNTER — Encounter: Payer: BC Managed Care – PPO | Admitting: Surgery

## 2010-09-01 DIAGNOSIS — I712 Thoracic aortic aneurysm, without rupture: Secondary | ICD-10-CM

## 2010-09-01 MED ORDER — GADOBENATE DIMEGLUMINE 529 MG/ML IV SOLN: 14.0000 mL | Freq: Once | INTRAVENOUS | Status: AC | PRN

## 2010-09-01 MED ORDER — GADOBENATE DIMEGLUMINE 529 MG/ML IV SOLN
15.0000 mL | Freq: Once | INTRAVENOUS | Status: AC | PRN
  Administered 2010-09-01: 15 mL via INTRAVENOUS

## 2010-09-08 ENCOUNTER — Encounter (INDEPENDENT_AMBULATORY_CARE_PROVIDER_SITE_OTHER): Payer: BC Managed Care – PPO | Admitting: Surgery

## 2010-09-08 DIAGNOSIS — I712 Thoracic aortic aneurysm, without rupture: Secondary | ICD-10-CM

## 2010-09-09 ENCOUNTER — Ambulatory Visit (INDEPENDENT_AMBULATORY_CARE_PROVIDER_SITE_OTHER): Payer: BC Managed Care – PPO | Admitting: *Deleted

## 2010-09-09 DIAGNOSIS — Z954 Presence of other heart-valve replacement: Secondary | ICD-10-CM

## 2010-09-09 DIAGNOSIS — I359 Nonrheumatic aortic valve disorder, unspecified: Secondary | ICD-10-CM

## 2010-09-09 DIAGNOSIS — Q231 Congenital insufficiency of aortic valve: Secondary | ICD-10-CM

## 2010-09-09 LAB — POCT INR: INR: 2.7

## 2010-09-09 NOTE — Assessment & Plan Note (Signed)
OFFICE VISIT  Monica Key, Monica Key DOB:  Apr 26, 1956                                        Sep 08, 2010 CHART #:  [id number]  The patient returned to my office today for followup of an ascending aortic aneurysm.  I last saw her on September 02, 2009, at which time, MR angiogram showed no change in the size of her ascending aorta compared to the previous exam.  The maximum diameter was 4.8 cm with smooth fusiform aneurysmal dilatation of the ascending aorta.  There did not appear to be any significant change dating back to 1999 when she had her initial aortic valve replacement.  Over the past year, she said she has been feeling well.  She denies any chest pain or pressure.  She has had no back pain.  Her blood pressure has been under good control at home. She denies any shortness of breath.  PHYSICAL EXAMINATION:  Vital Signs:  Blood pressure is 130/74, pulse is 70 and regular, respiratory rate is 20 and unlabored, oxygen saturation on room air is 96%.  General:  She looks well.  Cardiac:  Regular rate and rhythm with crisp mechanical valve click.  There is a soft systolic flow murmur.  Lungs:  Clear.  MR angiogram of the chest today shows the maximum diameter of her ascending aorta to be 4.8 cm, which is unchanged from her study 1 year ago.  There is no evidence of dissection or fluid around the aorta.  Her proximal descending aorta measured about 22 mm.  IMPRESSION:  She has stable fusiform ascending aortic aneurysm with maximum diameter of 4.8 cm.  This has not changed much going back to 1999 when I did her aortic valve replacement.  I think she should continue her beta-blocker with tight blood pressure control and we will continued to follow this.  I will see her back in about 2 years with an MR angiogram of the chest.  She knows to contact me immediately if she develops any chest pain or pressure or back pain.  Evelene Croon, M.D. Electronically  Signed  BB/MEDQ  D:  09/08/2010  T:  09/09/2010  Job:  [id number]  cc:   Bevelyn Buckles. Bensimhon, MD

## 2010-09-22 NOTE — Discharge Summary (Signed)
Monica Key, Monica Key                 ACCOUNT NO.:  [id number]   MEDICAL RECORD NO.:  [id number]          PATIENT TYPE:  INP   LOCATION:  4733                         FACILITY:  [facility]   PHYSICIAN:  Veverly Fells. Excell Seltzer, MD  DATE OF BIRTH:  04-Nov-1955   DATE OF ADMISSION:  10/31/2008  DATE OF DISCHARGE:  11/01/2008                               DISCHARGE SUMMARY   PRIMARY CARDIOLOGIST:  Bevelyn Buckles. Bensimhon, MD   PRIMARY CARE PHYSICIAN:  Not listed.   PROCEDURES PERFORMED DURING HOSPITALIZATION:  None.   FINAL DISCHARGE DIAGNOSES:  1. Atypical chest pain.  2. Hypertension.  3. Status post mechanical aortic valve repair in 1999 for bicuspid      aortic valve.  4. Ascending aortic aneurysm stable at 4.7 cm by recent assessment.  5. History of hypertension.  6. Hyperlipidemia.  7. Obesity.  8. Negative stress Myoview in 2008.   HOSPITAL COURSE:  A 55 year old Caucasian female with medical history  significant for hypertension and bicuspid aortic valve status post St.  Jude aortic valve replacement in 1999 who presented to the emergency  room with new-onset chest discomfort, which started approximately 2:00  p.m. the day of admission while at rest.  The patient states that the  pain was left sided, radiating to her left arm into her jaw and was not  relieved or exacerbated by movement.  At that time, she went to saw her  primary care physician who told her that her EKG was abnormal and she  should report to the emergency room.  The patient was seen in the  emergency room, and the patient did have a recurrence of chest pain at  that time during ambulation, which was improved after rest.  The patient  was admitted after having to rule out myocardial infarction.  The  patient's cardiac enzymes were found to be negative.  The patient was  monitored overnight for any reoccurrence of chest discomfort.  The  patient was seen and examined by Dr. Tonny Bollman the following  morning, and the  patient was found to be stable with no recurrence of  chest discomfort and anxious to return home.  The patient had no ST-T  wave abnormalities on followup, and.  Dr. Excell Seltzer felt that the patient  could be discharged home with an outpatient stress Myoview and follow  with Dr. Gala Romney as an outpatient.   DISCHARGE LABORATORY DATA:  TSH 2.034.  Troponin less than 0.05 and 0.01  respectively.  PT 36.1, INR 3.3.  Sodium 141, potassium 4.3, chloride  109, CO2 of 26, glucose 116, BUN 18, creatinine 0.77.  Hemoglobin 14.9,  hematocrit 43.4, white blood cells 7.1, platelets 189.  Chest x-ray  dated October 31, 2008, no active cardiopulmonary disease, previous aortic  valve replacement as noted.   EKG, on discharge, normal sinus rhythm, low voltage QRS with nonspecific  T-wave abnormality noted inferolaterally.   Vital signs, on discharge, blood pressure 101/51, heart rate 69,  respirations 16, O2 sat 96% on room air with a temperature of 96.1.   DISCHARGE MEDICATIONS:  1. Metoprolol 25 mg  daily.  2. Aspirin 81 mg daily.  3. Zocor 20 mg at bedtime.  4. Coumadin 7.5 mg daily except for Fridays and Mondays 10 mg daily.  5. Fish oil 1 tablet daily.  6. Vitamin D 1 tablet daily.   ALLERGIES:  PENICILLIN, SULFA, VANCOMYCIN.   FOLLOWUP PLANS AND APPOINTMENT:  1. The patient is scheduled to be seen in the Coumadin Clinic on November 12, 2008, at 10:45 a.m.  2. The patient is to follow up with a stress Myoview on November 07, 2008,      at [facility].  3. The patient is to follow up with Dr. Gala Romney or physician      assistant on November 15, 2008, at 8:45 a.m.  4. The patient is advised to follow up with primary care physician for      continued medical management.   TIME SPENT WITH THE PATIENT TO INCLUDE PHYSICIAN TIME:  35 minutes.      Bettey Mare. Lyman Bishop, NP      Veverly Fells. Excell Seltzer, MD  Electronically Signed    KML/MEDQ  D:  11/01/2008  T:  11/02/2008  Job:  [id number]

## 2010-09-22 NOTE — Assessment & Plan Note (Signed)
OFFICE VISIT   Monica Key, Monica Key  DOB:  06-23-1955                                        September 02, 2009  CHART #:  [id number]   HISTORY:  The patient returned to my office today for follow up of an  ascending aortic aneurysm.  I saw her initially on July 16, 2008, for  evaluation of this aneurysm.  I had operated on her on August 08, 1997,  performing St. Jude mechanical valve replacement for severe aortic  stenosis with bicuspid valve.  She has some enlargement of her aorta at  that time.  Decision was made not to replace her ascending aorta.  She  had been followed by Dr. Arvilla Meres and was referred to me last  March for further evaluation concerning her ascending aorta.  She had an  MR angiogram of the chest on June 17, 2008, that showed no  significant change in the size of her ascending aorta with a maximum  diameter about 4.7 cm.  This was a fusiform aneurysmal enlargement.  When I reviewed her previous CTs compared to this, I did not feel that  there had been any significant change since 1999.  I decided to see her  back again this year with a repeat MR angiogram of the chest to follow  up on this.  She said that over the past year she has been feeling well.  She has had no problems with anticoagulation.  Blood pressure has  remained under good control on the beta-blocker.  She denies any chest  pain or pressure and no shortness of breath.   PHYSICAL EXAMINATION:  Today, blood pressure is 119/72, pulse is 64 and  regular, respiratory rate is 18 and unlabored.  Oxygen saturation on  room air is 93%.  She looks well.  Cardiac exam shows regular rate and  rhythm with a crisp mechanical valve click.  There is a soft systolic  flow murmur.  There is no diastolic murmur.  Lungs are clear.   DIAGNOSTIC TESTS:  MR angiogram of the chest date of August 11, 2009,  shows no change in the size of her ascending aorta.  There is a smooth  fusiform aneurysmal  dilatation with a maximum diameter of 4.8 cm.  This  tapers to about 3.9 cm in the proximal arch and 2 cm in the distal arch.   IMPRESSION:  The patient has stable fusiform enlargement of her  ascending aorta with maximum diameter of about 4.8 cm.  This has not  significantly changed going back to 1999 at the time that I did her  initial valve replacement surgery.  I have recommended that she continue  her beta-blocker with good blood pressure control.  I think the risk of  complications related to her aorta is still very low and I would  recommend continued followup.  We will plan to do a repeat MR angiogram  in 1 year.  If there is no significant change at that time, than we will  start to space out her follow up.   Evelene Croon, M.D.  Electronically Signed   BB/MEDQ  D:  09/02/2009  T:  09/03/2009  Job:  [id number]   cc:   Bevelyn Buckles. Bensimhon, MD  Brenton Grills, MD

## 2010-09-22 NOTE — Consult Note (Signed)
NEW PATIENT CONSULTATION   Monica Key, Monica Key  DOB:  [birth year]                                        July 16, 2008  CHART #:  [id number]   REASON FOR CONSULTATION:  Ascending aortic aneurysm.   CLINICAL HISTORY:  I was asked by Dr. Gala Romney to evaluate the patient  for an ascending aortic aneurysm.  She is a 55 year old woman who had  undergone aortic valve replacement with a 23-mm St. Jude mechanical  valve by me on August 08, 1997, for severe aortic stenosis with a bicuspid  aortic valve.  Review of my office records reveals that there were some  concern about the size of her ascending aorta and a CT scan  preoperatively showed the maximum diameter to be 4.7 cm.  I have  discussed the possibility of having to replace her ascending aorta  preoperatively, but in the operating room, it was felt by me and my  partners that the size of her ascending aorta was not significantly  enlarged and therefore only the aortic valve was replaced.  She had an  uncomplicated postoperative course and has been followed by [facility] ever since.  She had a CT scan of the chest on 09/16/2005,  which showed the maximum diameter of ascending aorta to be about 4.5 cm.  She saw Dr. Gala Romney in October 2009 and subsequently, underwent a MR  angiogram of the chest for followup of her aorta on 06/17/2008.  This  shows the maximum diameter of her ascending aorta to be about 4.7 cm.  The aorta measures about 3.8 cm at the proximal aortic arch and the  distal arch tapers about 2 cm.   REVIEW OF SYSTEMS:  General:  She denies any fever or chills.  She does  report some recent weight gain and has been trying to lose weight.  She  attributes this to not exercising recently.  Eyes:  Negative.  ENT:  Negative.  Endocrine:  She denies diabetes and hypothyroidism.  Cardiovascular:  She denies any chest pain or pressure.  She has had no  shortness of breath.  She denies PND or orthopnea.  She  has had no  peripheral edema.  Respiratory:  She denies cough and sputum production.  GI:  She has had no nausea or vomiting.  She denies melena and bright  red blood per rectum.  GU:  She denies dysuria and hematuria.  Musculoskeletal:  She denies arthralgias and myalgias.  Vascular:  She  denies claudication and phlebitis.  Neurological:  She denies any focal  weakness or numbness.  She denies dizziness and syncope.  She has never  had TIA or stroke.  Psychiatric:  Negative.  Hematological:  She has no  history of bleeding disorders or easy bleeding and has been maintained  on Coumadin since her valve replacement.   PAST MEDICAL HISTORY:  Significant for history of bicuspid aortic valve  disease, status post aortic valve replacement in 1999.  She is status  post C-section in the past.   ALLERGIES:  To penicillin and sulfa.   MEDICATIONS:  Coumadin, Zocor 20 mg daily, aspirin 81 mg daily, fish  oil, vitamin B12, vitamin D, coenzyme q. 10, and tumeric.   FAMILY HISTORY:  Positive for cardiac disease in her father.   SOCIAL HISTORY:  She is married and  has 1 adult child.  She works for  the [employer].  She has never smoked.  She denies alcohol abuse.   PHYSICAL EXAMINATION:  VITAL SIGNS:  Her blood pressure is 150/83 on the  right and 146/84 on the left.  Pulses 84 and regular.  Respiratory rate  is 18 unlabored.  Oxygen saturation on room air is 96%.  GENERAL:  She is a well-developed white female, in no distress.  HEENT:  Be normocephalic and atraumatic.  Pupils are equal and reactive  to light and accommodation.  Extraocular muscles are intact.  Her throat  is clear.  Teeth are in good condition.  NECK:  Normal carotid pulses bilaterally.  There are no bruits.  There  is no adenopathy or thyromegaly.  CARDIAC:  Regular rate and rhythm with a crisp mechanical valve click.  There is a soft systolic flow murmur over the aorta.  There is no  diastolic murmur.  There is a well-healed  sternotomy incision.  LUNGS:  Clear.  ABDOMEN:  Active bowel sounds.  ABDOMEN:  Soft, mildly obese, and nontender.  There are no palpable  masses or organomegaly.  EXTREMITIES:  No peripheral edema.  NEUROLOGIC:  Be alert and oriented x3.  Motor and sensory exams are  grossly normal.   IMPRESSION:  The patient has a 4.7-cm fusiform aortic aneurysm with  history of aortic valve replacement in 1999 for bicuspid valve disease.  Review of her CT scan report from 1999 showed that her aorta was  disseized in 1999 preoperatively.  It did not appear that impressive in  the operating room, was therefore not replaced.  I did review her CT  scan from 2007 and her MRI from 2010 and there is no significant change  between these 2 studies.  I suspect that her ascending aortic aneurysm  has been stable since 1999.  I think, the best option at this time is  continued observation and good control of her blood pressure.  Her blood  pressure is somewhat elevated today and she has not on the beta-blocker,  so I have started her on Toprol-XL 25 mg daily.  I asked her to set up  an appointment to see Dr. Gala Romney in followup, at time her blood  pressure checked again because she may need a higher dose of beta-  blocker and possibly addition of an ACE inhibitor.  I told her that I  did not feel there is any need for limitation of her activity.  I would  plan to see her back in 1 year with a repeat MR angiogram of the chest  to reassess the aortic aneurysm.  I would not recommend surgical repair  unless we see progressive enlargement of her ascending aorta.  I told  her that if there is a low risk of complication such as aortic  dissection or rupture given the current size of her aorta and I think,  the risk of surgery outweighs the risk of those problems at this time.  She understands all this and agrees to proceed with another scan in 1  year.   Evelene Croon, M.D.  Electronically Signed   BB/MEDQ  D:   07/16/2008  T:  07/16/2008  Job:  [id number]   cc:   Bevelyn Buckles. Bensimhon, MD

## 2010-09-22 NOTE — Assessment & Plan Note (Signed)
[facility]                            CARDIOLOGY OFFICE NOTE   CRISOL, MUECKE                        MRN:          [id number]  DATE:03/04/2008                            DOB:          11-Jun-1955    INTERVAL HISTORY:  Monica Key is a very pleasant 55 year old woman who works  for the [employer].  She has a history of bicuspid aortic valve and she is  status post St. Jude mechanical aortic valve replacement in 1999.  She  has had an ascending thoracic aortic aneurysm about 4.5 cm, which has  been stable.   Remainder of her medical history is notable for hypertension,  hyperlipidemia, and obesity.  She returns today for  routine followup.   She is somewhat frustrated herself.  She had previously lost about  nearly 30 pounds, but has now regained this despite keeping track of  everything she eats.  She is still walking 2 to 3 miles a day with fast  pace with no chest pain or shortness of breath.  Blood pressure has been  well controlled.   REVIEW OF SYSTEMS:  She denies any palpitations.  No syncope or  presyncope.  No heart failure symptoms.  Remainder review of systems is  negative except for as above.   CURRENT MEDICATIONS:  1. Coumadin.  2. Calcium.  3. Aspirin 81.  4. Multivitamin.  5. Simvastatin 20.  6. Vitamin D.  7. Coenzyme Q10.  8. Fish oil.  9. Turmeric and cinnamon.   PHYSICAL EXAMINATION:  She is well appearing, no acute distress,  ambulates around the clinic without respiratory difficulty.  Blood pressure is 126/78, heart rate 80, and weight 187.  HEENT:  Normal.  NECK:  Supple.  No JVD.  Carotid 2+ bilaterally with no bruits.  There  is no lymphadenopathy or thyromegaly.  CARDIAC:  PMI is nondisplaced.  Regular rate and rhythm with 2/6  systolic ejection murmur at the right sternal border.  There is a crisp  mechanical S2.  There is no diastolic murmur.  LUNGS:  Clear.  ABDOMEN:  Soft, nontender, nondistended.  No  hepatosplenomegaly, no  bruits, no masses.  Good bowel sounds.  EXTREMITIES:  Warm with no  cyanosis, clubbing, or edema.  NEURO:  Alert and oriented x3.  Cranial nerves II-XII are intact.  Moves  all 4 extremities without difficulty.  Affect is pleasant.   Fasting glucose is 105, total cholesterol is 157, triglycerides are 68,  HDL is 40, LDL is 97, and CRP is less than 1.   ASSESSMENT AND PLAN:  1. History of bicuspid aortic valve, status post mechanical aortic      valve replacement, which is stable.  She does have a residual      thoracic aneurysm.  We will keep an eye on this yearly, this has      been stable.  We will go ahead and get an thoracic MRI to make sure      there has been no change.  2. Hyperlipidemia.  Lipids look good.  3. Hypertension.  Blood pressure looks good.  DISPOSITION:  We will see her back in 1 year for yearly followup.     Bevelyn Buckles. Bensimhon, MD  Electronically Signed    DRB/MedQ  DD: 03/04/2008  DT: 03/05/2008  Job #: [id number]

## 2010-09-22 NOTE — H&P (Signed)
Monica Key, Monica Key                 ACCOUNT NO.:  [id number]   MEDICAL RECORD NO.:  [id number]          PATIENT TYPE:  INP   LOCATION:  4733                         FACILITY:  [facility]   PHYSICIAN:  Brayton El, MD    DATE OF BIRTH:  [birth year]   DATE OF ADMISSION:  10/31/2008  DATE OF DISCHARGE:                              HISTORY & PHYSICAL   CHIEF COMPLAINT:  Chest discomfort.   HISTORY OF PRESENT ILLNESS:  Ms. Monica Key is a 55 year old white female  past medical history significant for hypertension, hyperlipidemia,  bicuspid aortic valve status post St. Jude aortic valve replacement in  1999 who is presenting with new-onset chest discomfort.  The patient  states that today approximately 2:00 p.m. while at rest she developed  mild left-sided chest discomfort that eventually radiated to her left  arm and somewhat to her jaw.  The pain was not relieved or exacerbated  with movement.  She reported to her primary care doctor's office and was  told that her EKG was abnormal and that she should report the emergency  room.  In total the discomfort lasted approximately an hour and a half.  Here in the emergency department the patient did have recurrence of this  chest discomfort upon ambulation which improved after rest.  Also in the  emergency room the patient had two sets of cardiac markers that were  completely within normal limits.  The patient states that other than  this chest discomfort she has been in her normal state of health.  She  denies any shortness of breath, nausea and vomiting, diaphoresis or  lower extremity edema.  She also denies using her left arm for any heavy  lifting recently.   PAST MEDICAL HISTORY:  Bicuspid aortic valve status post 23 mm St. Jude  aortic valve replacement 1999.  The patient has an ascending aortic  aneurysm measuring 4.7 cm in diameter per an MRA in February 2010.  The  patient has hypertension, hyperlipidemia, obesity.  Per the patient had  a  negative stress test approximately 2 years ago.   SOCIAL HISTORY:  No tobacco or alcohol.   FAMILY HISTORY:  Positive for coronary disease in the father however, it  is not clear whether this was premature coronary disease or not.   ALLERGIES:  The allergies include PENICILLIN, SULFA AND VANCOMYCIN.   MEDICATIONS:  1. Aspirin 81 mg daily.  The patient states she takes this      inconsistently.  2. Coumadin 7.5 mg daily.  3. Simvastatin 20 mg daily.  4. Coenzyme-Q10.  5. Toprol XL 25 mg daily.  6. Fish oil occasionally.   REVIEW OF SYSTEMS:  The patient states that she worries frequently  however this has not been any worse than usual.  Other systems as in  HPI.  All other systems were reviewed and are negative.   PHYSICAL EXAMINATION:  VITAL SIGNS:  Temperature is 98, pulse 72,  respirations 18, blood pressure 123/66 and saturating 99% on room air.  GENERAL:  She is in no acute distress.  HEENT: Normocephalic, atraumatic.  NECK:  Supple.  There is no lymphadenopathy.  Carotid bruits or JVD.  She is 2+ bilateral carotid pulses.  HEART:  Regular rate and rhythm with a early 2/6 systolic murmur and a  loud S2 consistent with a prosthetic valve.  LUNGS:  Clear to auscultation bilaterally.  ABDOMEN:  Soft, nontender, nondistended.  EXTREMITIES:  Without edema.  SKIN:  Warm and dry.  There is no evidence of rash or lesion.  NEUROLOGY:  The patient is alert, oriented x3.  Cranial nerves II-XII  grossly intact.  There are no focal deficits.  MUSCULOSKELETAL:  5/5 strength bilateral upper and lower extremities.  The patient has some mild tenderness to palpation over the left side of  her chest.  PSYCHIATRIC:  The patient is appropriate with normal levels of insight  although she does appear anxious.   LABS:  Sodium 141, potassium 4.3, chloride 109, CO2 26, BUN 18,  creatinine 0.77, glucose 116.  White count 7, hemoglobin 15, hematocrit  43 with platelet count 199, troponin less  than 0.05 x2.  CK-MB less than  1.0 x2.  Her INR is 3.1.  Her PTT is 51.  Chest x-ray is negative for  any acute process.  EKG independently interpreted by myself demonstrates  normal sinus rhythm with nonspecific T-wave abnormalities.   ASSESSMENT:  A 55 year old white female with a prosthetic aortic valve,  hypertension, hyperlipidemia presenting with chest pain that has some  typical and some atypical features of angina.  Cardiac enzymes are  completely normal x2 and her EKG has some nonspecific T-wave  abnormalities.  The fact that the patient does have some tenderness over  the left side of her chest decreases the likelihood of symptomatic  coronary disease.   PLAN:  The patient be admitted to telemetry and ruled out for myocardial  infarction.  We will continue her home medications except we will  increase her aspirin from 81 mg to 162 mg daily.  She will be made  n.p.o. after midnight for a probable stress test in the morning.      Brayton El, MD  Electronically Signed     Brayton El, MD  Electronically Signed    SGA/MEDQ  D:  10/31/2008  T:  11/01/2008  Job:  [id number]

## 2010-09-22 NOTE — Assessment & Plan Note (Signed)
[facility]                            CARDIOLOGY OFFICE NOTE   Monica Key, MANTERNACH                        MRN:          [id number]  DATE:02/15/2007                            DOB:          1956-02-03    INTERVAL HISTORY:  Monica Key is a very pleasant 55 year old woman with a  history of bicuspid aortic valve status post aortic valve replacement in  1999.  She has an ascending thoracic aneurysm which is about 4.5 cm  which has been stable.  History is also notable for hypothermia,  hypertension, and obesity.  She returns today for routine followup.   From a cardiovascular point of view, she is going great.  She is walking  3 miles a day without any chest pain or dyspnea.  She has been very  concerned about her weight and has 14 pounds.  She has also been  watching what she eats and trying to keep her cholesterol down.   CURRENT MEDICATIONS:  1. Coumadin.  2. Aspirin 81.  3. Multivitamin.   PHYSICAL EXAM:  She is well-appearing, in no acute distress, ambulates  around the clinic without any respiratory difficulty.  Blood pressure is  128/78, heart rate 60.  Weight is 160.  HEENT:  Normal.  NECK:  Supple.  There is no JVD.  Carotids are 2+ bilaterally.  There  are no bruits.  There is no lymphadenopathy or thyromegaly.  CARDIAC:  Regular rate and rhythm, 2/6 systolic ejection murmur at the  right sternal border with a crisp mechanical S2.  PMI is nondisplaced.  LUNGS:  Clear.  ABDOMEN:  Soft, nontender, nondistended, no hepatosplenomegaly, no  bruits, no masses, good bowel sounds.  EXTREMITIES:  Warm with no cyanosis, clubbing, or edema.  No rash.  NEURO:  Alert and oriented x3.  Cranial nerves 2-12 are intact and moves  all 4 extremities without difficulty.   LAB DATA:  A fasting glucose is 101, creatinine 0.7.  LFTs are normal.  Total cholesterol is 185 down from 216.  Triglycerides are 75.  HDL is  37 down from 43.  LDL is 133 down from  152.   ASSESSMENT AND PLAN:  1. Aortic valve replacement with residual ascending aortic aneurysm.      This is stable.  She is due for a yearly echocardiogram in      February.  Continue Coumadin.  2. Hypertension well controlled.  Given her weight loss, she will not      need any medical therapy at this time.  3. Hyperlipidemia.  Once again, we had a long talk about her lipids      with a goal LDL of 100.  She is in agreement to start simvastatin      20 mg daily.  She is quite frustrated at the need for medical      therapy.   DISPOSITION:  See her back in clinic in 6 months for a routine followup.     Bevelyn Buckles. Bensimhon, MD  Electronically Signed    DRB/MedQ  DD: 02/15/2007  DT: 02/16/2007  Job #:  [id number] 

## 2010-09-25 NOTE — Assessment & Plan Note (Signed)
[facility]                            CARDIOLOGY OFFICE NOTE   Monica Key                        MRN:          [id number]  DATE:08/17/2006                            DOB:          03-19-1956    Monica Key is a 55 year old woman with a history of bicuspid aortic valve  status post aortic valve replacement in 1999.  Monica Key also has ascending  thoracic aneurysm which is about 4.5 cm which has been stable.  History  is also notable for hyperlipidemia and hypertension and obesity.  For  complete problems, see my note of June 15, 2006.   INTERVAL HISTORY:  Since I last saw her, Monica Key has developed a rash  on both forearms and ankle.  Monica Key has been seen by dermatology, and there  is some question whether or not this was related to hydrochlorothiazide.  Monica Key has a history of a SULFA allergy.  Hydrochlorothiazide was stopped.  Monica Key was put on prednisone.  Subsequently Monica Key has had some swelling.  Her  taper is just about finished.  Monica Key continues to be very active with her  exercise and diet.  Monica Key has lost over 30 pounds in the past several  months.  Monica Key denies any chest pain or shortness of breath.  Monica Key did have  an echocardiogram done recently which showed well-functioning aortic  valve prosthesis and moderate descending aortic dilatation about 4.5 to  4.6 cm.  There is trivial AI.   Since I last saw her, Monica Key also had a lipid panel which showed an LDL  cholesterol of 151, and we suggested Zocor.  Monica Key remains reluctant to do  this and is interested in trying to do it without a statin  drug.  We  had a long discussion about this.   CURRENT MEDICATIONS:  1. Coumadin.  2. Aspirin 81.  3. Multivitamins.   ALLERGIES:  PENICILLIN, SULFA, and possibly HYDROCHLOROTHIAZIDE.   PHYSICAL EXAMINATION:  GENERAL: Well appearing, in no acute distress.  Ambulates around the clinic without any respiratory difficulty.  VITAL SIGNS: Blood pressure 123/84, heart  rate 70, weight 164.  HEENT:  Sclerae anicteric, EOMI.  There is no xanthelasma.  Mucous  membranes moist.  Oropharynx clear.  NECK:  Supple, no JVD.  Carotids are 2+ bilaterally without bruits.  There is no lymphadenopathy or thyromegaly.  CARDIAC:  Regular rate and rhythm with 2/6 systolic ejection murmur at  the right sternal border and a crisp mechanical S2.  LUNGS: Clear.  ABDOMEN:  Obese, nontender, nondistended.  No hepatosplenomegaly, no  bruits, no masses.  Good bowel sounds.  EXTREMITIES:  Warm with no cyanosis, clubbing, or edema.  Monica Key does have  multiple ecchymoses on her left arm as well as a healing rash on her  forearms.  NEUROLOGIC: Alert and oriented x3.  Cranial nerves II-XII intact.  Moves  all four extremities without difficulty.   LABORATORY DATA:  Total cholesterol 216, triglycerides 86, HDL 43, and  LDL 152.  CRP is less than 1.   ASSESSMENT AND PLAN:  1. Aortic valve replacement with residual ascending  aortic aneurysm.      This is quite stable. W will continue with yearly echocardiogram.      Monica Key is maintained on Coumadin and followed in the Coumadin clinic.  2. Hypertension.  This is relatively well controlled.  Given the fact      that we are stopping hydrochlorothiazide, will go ahead and add      spironolactone 25 mg a day, and we will check her electrolytes next      week to make sure her potassium is okay.  3. Hyperlipidemia.  We had a long talk about the relative merits of      treating LDL and HDL.  I once again reiterated my suggestion that      Monica Key should be on Simvastatin 20 mg; however, Monica Key would like to      recheck her cholesterol and see how Monica Key is doing with diet and      exercise.  I said I thought this was very reasonable. We will      recheck next week when we check her potassium and revisit the idea      of starting a statin  with a goal LDL of approximately 100.   DISPOSITION:  Return to clinic in 6 months for routine followup.  We   will see her in one year for an echocardiogram as well.     Monica Buckles. Bensimhon, MD  Electronically Signed    DRB/MedQ  DD: 08/17/2006  DT: 08/17/2006  Job #: [id number]

## 2010-09-25 NOTE — H&P (Signed)
Monica Key, Monica Key                 ACCOUNT NO.:  [id number]   MEDICAL RECORD NO.:  [id number]          PATIENT TYPE:  INP   LOCATION:  0162                         FACILITY:  [facility]   PHYSICIAN:  Clovis Pu. Cornett, M.D.DATE OF BIRTH:  May 17, 1955   DATE OF ADMISSION:  06/24/2005  DATE OF DISCHARGE:                                HISTORY & PHYSICAL   CHIEF COMPLAINT:  Abdominal pain.   HISTORY OF PRESENT ILLNESS:  The patient is a 55 year old female with a two-  day history of lower abdominal pain.  She started two days ago after a bowel  movement.  The pain is moderate in intensity.  There is no nausea or  vomiting with it.  It is in both her left and right lower quadrants.  There  is no significant radiation of her pain.  It has been quite constant and she  has not found anything to make it better.  She has a past history of a  bicuspid aortic valve requiring replacement. Is on chronic Coumadin and  aspirin therapy.  She denies any blood per rectum or any mucousy bloody stool.   PAST MEDICAL HISTORY:  Aortic valve replacement on chronic Coumadin.   PAST SURGICAL HISTORY:  Please see above.   ALLERGIES:  PENICILLIN, SULFA.   MEDICATIONS:  1.  Coumadin 10 mg a day.  2.  One aspirin a day.   FAMILY HISTORY:  Noncontributory.   SOCIAL HISTORY:  Denies tobacco or alcohol use.  She is married.   REVIEW OF SYSTEMS:  Please see above.  Otherwise 10-point review of systems  negative.   REVIEW OF SYSTEMS:  As stated above.  Otherwise 10-point review of systems  negative.   PHYSICAL EXAMINATION:  VITAL SIGNS:  Temperature 97, pulse 111, blood  pressure 144/83.  GENERAL APPEARANCE:  White female in mild distress.  HEENT:  Extraocular movements are intact.  No scleral icterus.  Oropharynx  clear.  NECK:  Supple, nontender.  No JVD.  CHEST:  Clear to auscultation.  CARDIOVASCULAR:  There is a click noted from the aortic valve.  Otherwise  mild tachycardia noted without rub.  ABDOMEN:  Soft, minimally obese.  Lower quadrants, especially the left lower  quadrant is tender to palpation.  There is no mass.  There is not diffuse  peritonitis, but she is focally tender in her left lower and right lower  quadrants with mild guarding.  EXTREMITIES:  No clubbing, cyanosis or edema.  NEUROLOGIC:  Grossly intact.  Motor, sensory function.  Gait is normal.   DIAGNOSTIC STUDIES:  I have reviewed her abdominopelvic CT scan.  There are  signs of diverticulitis with thickening sigmoid colon and numerous  diverticula.  There are very miniscule locules of air around the sigmoid  colon and there are very small locules of air up around the liver edge.  There is no free fluid that I could see and there was no significant amount  of air.  Her PT is 51, INR is 5.7.  Her CBC shows a white count of 13,000,  hemoglobin 13, platelet count 239,000.  There is a left shift noted.  Electrolytes show sodium 128, potassium 3.4, chloride 101, carbon dioxide  19, glucose 111, BUN 8, creatinine 0.7. Her bilirubin total is 2.5, alk phos  73, SGPT is 14, SGOT 18, albumin 3.5.  She was typed and screened.  Urinalysis is within normal limits.   IMPRESSION:  1.  Microperforation sigmoid diverticulitis.  2.  Hypocoagulable state with supertherapeutic Coumadin dose with an INR of      5.7.   PLAN:  She will be admitted to the intensive care unit for IV fluids.  I  will start her on Tygacil 100 mg tonight and then 50 mg IV each day since  she had a penicillin allergy.  I will go ahead and give her two units of FFP  to bring her INR down some.  She may require surgical exploration but at  this point she is stable and also is hypocoagulable which makes any  therapeutic exploration quite dangerous at this point.  She is not overtly  septic but I feel like admission is warranted given her multiple medical  problems and her condition.  I have explained to her the need for possible  exploration with  colostomy depending on how she does.  If she responds to  medical management, I think it would be prudent in her case to follow her  given her aortic valve and anticoagulation needs.  We will recheck her PT  and INR in the morning and convert her to heparin if need be but currently  she is hypocoagulable.  We will give her one dose of vitamin K as well to  see if we can bring this down.      Thomas A. Cornett, M.D.  Electronically Signed     TAC/MEDQ  D:  06/25/2005  T:  06/25/2005  Job:  [id number]

## 2010-09-25 NOTE — Consult Note (Signed)
Monica Key, Monica Key                 ACCOUNT NO.:  [id number]   MEDICAL RECORD NO.:  [id number]          PATIENT TYPE:  OUT   LOCATION:  XRAY                         FACILITY:  [facility]   PHYSICIAN:  Rollene Rotunda, M.D.   DATE OF BIRTH:  Jan 27, 1956   DATE OF CONSULTATION:  06/25/2005  DATE OF DISCHARGE:                                   CONSULTATION   CONSULTING PHYSICIAN:  Clovis Pu. Cornett, M.D.   REASON FOR CONSULTATION:  Preoperative evaluation of patient status post  aortic valve placement.   HISTORY OF PRESENT ILLNESS:  The patient is a pleasant 55 year old white  female followed by Dr. Eden Emms. She has a history of a bicuspid aortic valve  and is status post St. Jude aortic valve replacement in 1999 by Dr. Laneta Simmers.  She has been on Coumadin since that time. She has had no problems. She had a  stress perfusion study last year as a routine. This apparently demonstrated  no evidence of ischemia or infarction. She has been active doing her  housework and also working at [employer] with activity that does get her  heart rate up. She does not have any chest discomfort, neck discomfort, arm  discomfort. She denies any __________ nausea, vomiting, excessive  diaphoresis. She has had no palpitations, presyncope or syncope. She has no  significant shortness of breath. She denies any PND or orthopnea.   She has developed abdominal pain and was found to have diverticulitis with  perforated viscus. She was on Coumadin on admission with an INR that was  supratherapeutic (5.7). This has been reversed with vitamin K and fresh  frozen plasma. She is now on heparin.   PAST MEDICAL HISTORY:  Bicuspid aortic valve with aortic stenosis.   PAST SURGICAL HISTORY:  St. Jude aortic valve replacement in 1999. C  section.   ALLERGIES:  PENICILLIN and SULFA.   MEDICATIONS PRIOR TO ADMISSION:  1.  Aspirin 81 mg daily.  2.  Coumadin 10 mg daily except for Tuesday and Saturday 7.5 mg.   SOCIAL HISTORY:   The patient lives in [place] with her husband. She is an  Dentist. She has never smoked cigarettes. She occasionally drinks  alcohol.   FAMILY HISTORY:  Noncontributory for early coronary artery disease. It is  questionable that her father had a myocardial infarction. He died at age 76.  He did have heart failure.   REVIEW OF SYSTEMS:  As stated in the HPI and positive for nausea and  vomiting accompanying this episode. Negative for all other systems.   PHYSICAL EXAMINATION:  GENERAL:  The patient is obviously uncomfortable with  nausea and vomiting and looks mildly acutely ill. There is no respiratory  distress. She has an appropriate affect.  VITAL SIGNS:  Blood pressure 125/50, heart rate sinus in the 90s,  respiratory rate 16, afebrile, 93% saturation on room air.  HEENT:  Eyes unremarkable, pupils equal round and reactive to light. Fundi  not visualized. Oral mucosa unremarkable, excellent dentition.  NECK:  No jugular venous distention at 45 degrees. Carotid upstroke brisk  and  symmetric. No bruits, no thyromegaly.  LYMPHATICS:  No cervical, axillary or inguinal adenopathy.  LUNGS:  Clear to auscultation bilaterally.  BACK:  No costovertebral angle tenderness.  CHEST:  Well healed sternotomy scar.  HEART:  PMI not displaced or sustained, S1 within normal limits, mechanical  S2, no S3, no S4, 2/8 apical systolic murmur radiating at the aortic outflow  track very early peaking, no diastolic murmurs.  ABDOMEN:  Mildly distended and diffusely tender, no overt rebound or  guarding, no obvious hepato or splenomegaly, no midline pulsatile mass or  bruits.  SKIN:  No rashes, no nodules.  EXTREMITIES:  2+ pulses throughout, no edema, no cyanosis, no clubbing.  NEUROLOGIC:  Oriented to person, place and time, cranial nerves II-XII  grossly intact, motor grossly intact.   EKG, sinus rhythm, rate 92, axis within normal limits, interval is within  normal limits, nonspecific  lateral T wave flattening, no old EKGs for  comparison.   LABORATORY DATA:  WBC 10.2, hemoglobin 12, sodium 138, potassium 3.4, BUN 9,  creatinine 0.6, total bilirubin 2.5.   ASSESSMENT/PLAN:  1.  The patient is at acceptable risk for surgery if needed. She has had a      negative stress perfusion study in the last year. She has had no      symptoms and she has a very high functional status. No further      cardiovascular testing is suggested.  2.  Status post aortic valve replacement. The patient has a normally      functioning valve by history and physical. She of course needs      endocarditis prophylaxis for any nonsterile procedures. She is fine to      be off of the anticoagulation for as long as needed to safely get her      through surgery. She is at relatively low risk with a St. Jude valve in      the aortic position and otherwise normal heart in a young patient. We      need to be careful not to start Coumadin too soon on these patients so      as to avoid any postoperative bleeding complications.  3.  Followup - we will follow her through her hospitalization.           ______________________________  Rollene Rotunda, M.D.     JH/MEDQ  D:  06/25/2005  T:  06/25/2005  Job:  [id number]   cc:   Thomas A. Cornett, M.D.  [address]  [place] [address]   Charlton Haws, M.D.  [address]  [place]  [place] [address]

## 2010-09-25 NOTE — Assessment & Plan Note (Signed)
[facility]                              CARDIOLOGY OFFICE NOTE   Monica, Key                        MRN:          [id number]  DATE:03/18/2006                            DOB:          09/27/1955    Monica Key returns today for followup.  She is status post St. Jude aortic valve  replacement.  She has been doing well.  Her perforated diverticulitis seems  to have healed well.  She has seen Dr. Madilyn Fireman and apparently does not need a  colectomy.   She had a lot of questions today regarding her INR.  She has a St. Jude  aortic valve replacement.  I told her as far as I could tell she could run  her INR from 2.5 to 3.  Apparently, Monica Key thinks it should be between  2.5 and 3.5.  I told her to discuss this with [facility] further.   She has not had any bleeding problems.  She has been taking a lot of Motrin  for a recent neck and shoulder problem.  Today her INR was a bit low.   The patient also has a residual thoracic aneurysm measuring 4.4 x 4.3.  I  talked to her about how we can watch this as she is fairly young and this  may need intervention in the future.  Unfortunately, she is extremely  claustrophobic and gets anxious easily.  I am not sure she would tolerate an  MRI but by the same token I do not want her to have the extra radiation  involved with CT surveillance every year.  I told her I would talk to her  about this more in May.   Review of systems is otherwise remarkable for her recent neck problem.  She  saw Dr. Rennis Chris for this and apparently has to have some PT.  She has not had  any bleeding problems, TIAs, or chest pain or shortness of breath. On exam,  she looks well.  Blood pressure is 128/70, pulse is 68 and regular.  Lungs  are clear.  Carotids are normal.  There is an S1, S2 click with a soft  systolic murmur.  There is no aortic insufficiency.  Abdomen is benign.  Lower extremities with intact pulses, no edema.  HEENT is normal.   There is  no thyromegaly.  There is no lymphadenopathy.  Skin is warm and dry.   Medications are listed in the chart.  She really has not been on a beta  blocker or blood pressure medicine.  We may need to rethink this in the  future if her aneurysm were to increase in size.   IMPRESSION:  Stable status post St. Jude aortic valve replacement for  bicuspid aortic valve, Coumadin being followed in the Coumadin clinic.   We will figure out how to follow her thoracic aneurysm in May with either CT  or MR.   The patient will monitor her blood pressure at home.  I suspect at some  point in the next 2-3 years we will place her on beta blocker or  ACE  inhibitor in regards to her thoracic aneurysm.   She will see Monica Key in the Coumadin clinic to further discuss this.  I  did explain to her that a St. Jude valve in the aortic position usually does  not require overlapping of Lovenox and Coumadin when one needs a procedure  such as colonoscopy.   She seems comfortable with this idea now.    ______________________________  Noralyn Pick. Eden Emms, MD, [facility]    PCN/MedQ  DD: 03/18/2006  DT: 03/18/2006  Job #: [id number]   cc:   Everardo All. Madilyn Fireman, M.D.

## 2010-09-25 NOTE — Assessment & Plan Note (Signed)
[facility]                            CARDIOLOGY OFFICE NOTE   ARAYLA, KRUSCHKE                        MRN:          [id number]  DATE:06/15/2006                            DOB:          01-18-1956    PATIENT IDENTIFICATION:  Ms. Zaun is a very pleasant 55 year old woman  with a history of a bicuspid aortic valve status post aortic valve  replacement in 1999.  The patient is followed by Dr. Eden Emms and she  presents today for a second opinion and to establish long-term care.   PAST MEDICAL HISTORY:  1. Bicuspid aortic valve with post stenotic aortic dilatation.      a.     Status post aortic valve replacement with a St. Jude       mechanical prosthesis in April of 1990.      b.     Cardiac catheterization preoperatively showed normal       coronary arteries.      c.     Aortic root at the time of surgery was 4.7 cm.  2. Ascending thoracic aortic aneurysm.      a.     Aneurysm was 4.7 at the time of surgery in 1999.      b.     A CT scan of the chest in May of 2007 showed an ascending       thoracic aneurysm of 4.4x4.3 cm.  The descending aorta was normal.  3. History of diverticulitis with micro-perforation.  4. Borderline hypertension.  5. Obesity.  6. Claustrophobia.   CURRENT MEDICATIONS:  1. Coumadin.  2. Aspirin 81 mg a day.  3. Calcium.  4. Multivitamin.   ALLERGIES:  PENICILLIN, SULFA.   INTERVAL HISTORY:  Ms. Kneale presents today for a second opinion  regarding several issues, including her thoracic aortic aneurysm,  hypertension, and hyperlipidemia.  She says, functionally, she is doing  quite well.  She has no chest pain or shortness of breath.  She is  relatively active.  She denies any heart failure symptoms.  She says  that she has received somewhat conflicting information about her  thoracic aneurysm and is not sure how aggressive she should be in  following it up.  She is also concerned about having high blood pressure  and it being not well treated.  Finally, she wonders about her  cholesterol and whether or not she needs a C-reactive protein checked.   PHYSICAL EXAM:  She is well-appearing.  In no acute distress.  She  ambulates around the clinic without any difficulty.  Respirations are normal.  Blood pressure initially was taken at 146/80.  Manually, I got 128/74 in the right arm, 120/66 in the left arm.  Her  weight is 187.  Her pulse is 71.  HEENT:  Sclerae anicteric.  EOMI.  There is no xanthelasma.  Mucous  membranes are moist.  NECK:  Supple.  No JVD.  Carotids are 2+ bilaterally without any bruits.  There is no lymphadenopathy or thyromegaly.  CARDIAC:  Regular rate and rhythm with a mechanical __________, which is  crisp.  No murmurs, rubs, or gallops.  LUNGS:  Clear.  ABDOMEN:  Obese, nontender, nondistended.  No hepatosplenomegaly.  No  bruits.  No masses.  EXTREMITIES:  Warm with no cyanosis, clubbing, edema, or rashes.  NEURO:  Alert and oriented x3.  Cranial nerves 2-12 intact.  She moves  all 4 extremities without difficulty.  She is very pleasant.   EKG:  Sinus rhythm at a rate of 71 with mild nonspecific T wave  flattening.   ASSESSMENT AND PLAN:  1. History of bicuspid aortic valve status post prosthetic aortic      valve resection.  She is to continue her Coumadin.  Given the new      guidelines, her goal is between 2.0 and 3.0.  I told her that she      could stop her aspirin if she would like.  2. Ascending aortic aneurysm.  This has been stable for over 10 years      and I suspect it is just some post-stenotic diltation, residual      from her previous bicuspid aortic valve.  We will get an      echocardiogram to see if we can size it adequately by      echocardiogram and match it to her CT scan.  If this is the case, I      think that we can just follow it with yearly echocardiograms and      get a CT or MRI every 5 years or so to limit the radiation.  3. Hypertension.   By history her blood pressure seems to be drifting      upward.  However, today in the clinic it looks pretty good.  I have      asked her to keep a blood pressure log and bring it to me, and we      will treat as necessary.  4. Question of hyperlipidemia.  We will check a lipid panel and a CRP,      and get a baseline for her and treat as necessary.   DISPOSITION:  We will see her back in several months for routine  followup.     Bevelyn Buckles. Bensimhon, MD  Electronically Signed    DRB/MedQ  DD: 06/15/2006  DT: 06/15/2006  Job #: [id number]

## 2010-10-07 ENCOUNTER — Ambulatory Visit (INDEPENDENT_AMBULATORY_CARE_PROVIDER_SITE_OTHER): Payer: BC Managed Care – PPO | Admitting: *Deleted

## 2010-10-07 DIAGNOSIS — Z954 Presence of other heart-valve replacement: Secondary | ICD-10-CM

## 2010-10-07 DIAGNOSIS — Q2381 Bicuspid aortic valve: Secondary | ICD-10-CM

## 2010-10-07 DIAGNOSIS — Q231 Congenital insufficiency of aortic valve: Secondary | ICD-10-CM

## 2010-10-07 DIAGNOSIS — I359 Nonrheumatic aortic valve disorder, unspecified: Secondary | ICD-10-CM

## 2010-10-28 ENCOUNTER — Other Ambulatory Visit: Payer: Self-pay | Admitting: *Deleted

## 2010-10-28 MED ORDER — CARVEDILOL 3.125 MG PO TABS: 3.1250 mg | ORAL_TABLET | Freq: Two times a day (BID) | ORAL | Status: DC

## 2010-11-04 ENCOUNTER — Ambulatory Visit (INDEPENDENT_AMBULATORY_CARE_PROVIDER_SITE_OTHER): Payer: BC Managed Care – PPO | Admitting: *Deleted

## 2010-11-04 ENCOUNTER — Other Ambulatory Visit: Payer: Self-pay | Admitting: *Deleted

## 2010-11-04 DIAGNOSIS — Q2381 Bicuspid aortic valve: Secondary | ICD-10-CM

## 2010-11-04 DIAGNOSIS — Z954 Presence of other heart-valve replacement: Secondary | ICD-10-CM

## 2010-11-04 DIAGNOSIS — Q231 Congenital insufficiency of aortic valve: Secondary | ICD-10-CM

## 2010-11-04 DIAGNOSIS — I359 Nonrheumatic aortic valve disorder, unspecified: Secondary | ICD-10-CM

## 2010-11-04 LAB — POCT INR: INR: 2.2

## 2010-11-04 MED ORDER — WARFARIN SODIUM 10 MG PO TABS: ORAL_TABLET | ORAL | Status: DC

## 2010-12-02 ENCOUNTER — Ambulatory Visit (INDEPENDENT_AMBULATORY_CARE_PROVIDER_SITE_OTHER): Payer: BC Managed Care – PPO | Admitting: *Deleted

## 2010-12-02 DIAGNOSIS — I359 Nonrheumatic aortic valve disorder, unspecified: Secondary | ICD-10-CM

## 2010-12-02 DIAGNOSIS — Q231 Congenital insufficiency of aortic valve: Secondary | ICD-10-CM

## 2010-12-02 DIAGNOSIS — Z954 Presence of other heart-valve replacement: Secondary | ICD-10-CM

## 2010-12-02 LAB — POCT INR: INR: 3

## 2010-12-30 ENCOUNTER — Ambulatory Visit (INDEPENDENT_AMBULATORY_CARE_PROVIDER_SITE_OTHER): Payer: BC Managed Care – PPO | Admitting: *Deleted

## 2010-12-30 DIAGNOSIS — Q2381 Bicuspid aortic valve: Secondary | ICD-10-CM

## 2010-12-30 DIAGNOSIS — Z954 Presence of other heart-valve replacement: Secondary | ICD-10-CM

## 2010-12-30 DIAGNOSIS — Q231 Congenital insufficiency of aortic valve: Secondary | ICD-10-CM

## 2010-12-30 LAB — POCT INR: INR: 2.9

## 2011-01-27 ENCOUNTER — Ambulatory Visit (INDEPENDENT_AMBULATORY_CARE_PROVIDER_SITE_OTHER): Payer: BC Managed Care – PPO | Admitting: *Deleted

## 2011-01-27 DIAGNOSIS — I359 Nonrheumatic aortic valve disorder, unspecified: Secondary | ICD-10-CM

## 2011-01-27 DIAGNOSIS — Q231 Congenital insufficiency of aortic valve: Secondary | ICD-10-CM

## 2011-01-27 DIAGNOSIS — Z954 Presence of other heart-valve replacement: Secondary | ICD-10-CM

## 2011-01-27 LAB — POCT INR: INR: 2.4

## 2011-01-27 MED ORDER — WARFARIN SODIUM 7.5 MG PO TABS: ORAL_TABLET | ORAL | Status: DC

## 2011-02-24 ENCOUNTER — Ambulatory Visit (INDEPENDENT_AMBULATORY_CARE_PROVIDER_SITE_OTHER): Payer: BC Managed Care – PPO | Admitting: *Deleted

## 2011-02-24 DIAGNOSIS — I369 Nonrheumatic tricuspid valve disorder, unspecified: Secondary | ICD-10-CM

## 2011-02-24 DIAGNOSIS — Z954 Presence of other heart-valve replacement: Secondary | ICD-10-CM

## 2011-02-24 DIAGNOSIS — Q231 Congenital insufficiency of aortic valve: Secondary | ICD-10-CM

## 2011-02-24 DIAGNOSIS — I359 Nonrheumatic aortic valve disorder, unspecified: Secondary | ICD-10-CM

## 2011-02-24 LAB — POCT INR: INR: 2.4

## 2011-03-24 ENCOUNTER — Other Ambulatory Visit: Payer: Self-pay | Admitting: Internal Medicine

## 2011-03-24 MED ORDER — SIMVASTATIN 40 MG PO TABS: 40.0000 mg | ORAL_TABLET | Freq: Every day | ORAL | Status: DC

## 2011-04-07 ENCOUNTER — Ambulatory Visit (INDEPENDENT_AMBULATORY_CARE_PROVIDER_SITE_OTHER): Payer: BC Managed Care – PPO | Admitting: *Deleted

## 2011-04-07 DIAGNOSIS — Q231 Congenital insufficiency of aortic valve: Secondary | ICD-10-CM

## 2011-04-07 DIAGNOSIS — Z954 Presence of other heart-valve replacement: Secondary | ICD-10-CM

## 2011-04-07 DIAGNOSIS — I359 Nonrheumatic aortic valve disorder, unspecified: Secondary | ICD-10-CM

## 2011-05-19 ENCOUNTER — Ambulatory Visit (INDEPENDENT_AMBULATORY_CARE_PROVIDER_SITE_OTHER): Payer: BC Managed Care – PPO | Admitting: *Deleted

## 2011-05-19 DIAGNOSIS — Z954 Presence of other heart-valve replacement: Secondary | ICD-10-CM

## 2011-05-19 DIAGNOSIS — I359 Nonrheumatic aortic valve disorder, unspecified: Secondary | ICD-10-CM

## 2011-05-19 DIAGNOSIS — Q2381 Bicuspid aortic valve: Secondary | ICD-10-CM

## 2011-05-19 DIAGNOSIS — Q231 Congenital insufficiency of aortic valve: Secondary | ICD-10-CM

## 2011-05-19 LAB — POCT INR: INR: 2.5

## 2011-06-21 ENCOUNTER — Other Ambulatory Visit: Payer: Self-pay | Admitting: *Deleted

## 2011-06-22 ENCOUNTER — Telehealth: Payer: Self-pay | Admitting: Internal Medicine

## 2011-06-22 ENCOUNTER — Telehealth: Payer: Self-pay

## 2011-06-22 DIAGNOSIS — Z954 Presence of other heart-valve replacement: Secondary | ICD-10-CM

## 2011-06-22 DIAGNOSIS — I10 Essential (primary) hypertension: Secondary | ICD-10-CM

## 2011-06-22 MED ORDER — SIMVASTATIN 40 MG PO TABS: 40.0000 mg | ORAL_TABLET | Freq: Every day | ORAL | Status: DC

## 2011-06-22 NOTE — Telephone Encounter (Signed)
New Problem:     Patient called in because her pharmacy refused to refill her prescriptions until she had another OV with Dr. Gala Romney.  It is unknown if the patient is still eligible to see Dr. Gala Romney in the [facility] but she would still need some simvastatin (ZOCOR) 40 MG tablet to hold her over until her next visit. Please call back. Patient would like to consult with you about a good physician referral.

## 2011-06-22 NOTE — Telephone Encounter (Signed)
Simvastatin refilled x one.  Pt told she needs an appt for further refills.

## 2011-06-22 NOTE — Telephone Encounter (Signed)
Pt is transferring from Dr Gala Romney to Dr Excell Seltzer and would like to have blood work done prior to her appt.  Is this ok and if so, what do you want ordered?  Thanks.

## 2011-06-22 NOTE — Telephone Encounter (Signed)
Pt will call back and let us know which cardiologist she wants to transfer to if she cannot continue to see Dr Gala Romney.

## 2011-06-23 ENCOUNTER — Telehealth: Payer: Self-pay | Admitting: Cardiovascular Disease

## 2011-06-23 DIAGNOSIS — I10 Essential (primary) hypertension: Secondary | ICD-10-CM

## 2011-06-23 NOTE — Telephone Encounter (Signed)
This is fine. Should have CBC, CMET, lipid panel done

## 2011-06-23 NOTE — Telephone Encounter (Signed)
Orders placed for lab work.  I left a detailed message at work number for the pt to come into the office fasting on 06/30/11 for bloodwork (pt has coumadin appt).

## 2011-06-23 NOTE — Telephone Encounter (Signed)
I spoke with the pt and she would like to have labs rescheduled to 07/23/11.  The pt would also like a CRP drawn with her other labs.

## 2011-06-23 NOTE — Telephone Encounter (Signed)
New problem    Patient returning call back to nurse.   

## 2011-06-30 ENCOUNTER — Ambulatory Visit (INDEPENDENT_AMBULATORY_CARE_PROVIDER_SITE_OTHER): Payer: BC Managed Care – PPO | Admitting: *Deleted

## 2011-06-30 ENCOUNTER — Other Ambulatory Visit: Payer: BC Managed Care – PPO | Admitting: *Deleted

## 2011-06-30 DIAGNOSIS — Q231 Congenital insufficiency of aortic valve: Secondary | ICD-10-CM

## 2011-06-30 DIAGNOSIS — Z954 Presence of other heart-valve replacement: Secondary | ICD-10-CM

## 2011-07-15 ENCOUNTER — Telehealth: Payer: Self-pay | Admitting: Cardiovascular Disease

## 2011-07-15 MED ORDER — SIMVASTATIN 40 MG PO TABS: 40.0000 mg | ORAL_TABLET | Freq: Every day | ORAL | Status: DC

## 2011-07-15 NOTE — Telephone Encounter (Signed)
Pt needs call back from lauren re question on meds

## 2011-07-15 NOTE — Telephone Encounter (Signed)
I spoke with the pt and she had to reschedule her appointment with Dr Excell Seltzer into April.  The pt said she will run out of Simvastatin before this appointment and I sent in the medication with one refill.

## 2011-07-23 ENCOUNTER — Other Ambulatory Visit (INDEPENDENT_AMBULATORY_CARE_PROVIDER_SITE_OTHER): Payer: BC Managed Care – PPO

## 2011-07-23 ENCOUNTER — Telehealth: Payer: Self-pay | Admitting: Cardiovascular Disease

## 2011-07-23 DIAGNOSIS — Z954 Presence of other heart-valve replacement: Secondary | ICD-10-CM

## 2011-07-23 DIAGNOSIS — I10 Essential (primary) hypertension: Secondary | ICD-10-CM

## 2011-07-23 LAB — BASIC METABOLIC PANEL
BUN: 14 mg/dL (ref 6–23)
CO2: 28 mEq/L (ref 19–32)
Chloride: 108 mEq/L (ref 96–112)
Creatinine, Ser: 0.6 mg/dL (ref 0.4–1.2)

## 2011-07-23 LAB — CBC WITH DIFFERENTIAL/PLATELET
Basophils Absolute: 0 10*3/uL (ref 0.0–0.1)
Eosinophils Absolute: 0.1 10*3/uL (ref 0.0–0.7)
HCT: 42.4 % (ref 36.0–46.0)
Hemoglobin: 14.3 g/dL (ref 12.0–15.0)
Lymphs Abs: 1.5 10*3/uL (ref 0.7–4.0)
MCHC: 33.7 g/dL (ref 30.0–36.0)
MCV: 92 fl (ref 78.0–100.0)
Monocytes Relative: 6.9 % (ref 3.0–12.0)
Neutro Abs: 2.1 10*3/uL (ref 1.4–7.7)
RDW: 13.8 % (ref 11.5–14.6)

## 2011-07-23 LAB — HEPATIC FUNCTION PANEL
ALT: 15 U/L (ref 0–35)
Albumin: 4.2 g/dL (ref 3.5–5.2)
Total Protein: 7.1 g/dL (ref 6.0–8.3)

## 2011-07-23 LAB — LIPID PANEL
Cholesterol: 128 mg/dL (ref 0–200)
Triglycerides: 56 mg/dL (ref 0.0–149.0)

## 2011-07-23 NOTE — Telephone Encounter (Signed)
Please return call to patient on cell# [phone]   Patient has questions about medical treatment, she can be reached on cell# [phone].

## 2011-07-23 NOTE — Telephone Encounter (Signed)
Answered pts questions regarding blood work.

## 2011-07-28 ENCOUNTER — Encounter: Payer: BC Managed Care – PPO | Admitting: Cardiovascular Disease

## 2011-08-10 ENCOUNTER — Other Ambulatory Visit: Payer: Self-pay | Admitting: *Deleted

## 2011-08-10 MED ORDER — WARFARIN SODIUM 10 MG PO TABS: ORAL_TABLET | ORAL | Status: DC

## 2011-08-11 ENCOUNTER — Ambulatory Visit (INDEPENDENT_AMBULATORY_CARE_PROVIDER_SITE_OTHER): Payer: BC Managed Care – PPO | Admitting: Family Medicine

## 2011-08-11 ENCOUNTER — Encounter: Payer: Self-pay | Admitting: Family Medicine

## 2011-08-11 ENCOUNTER — Ambulatory Visit (INDEPENDENT_AMBULATORY_CARE_PROVIDER_SITE_OTHER): Payer: BC Managed Care – PPO | Admitting: *Deleted

## 2011-08-11 VITALS — BP 123/82 | HR 68 | Ht 65.0 in | Wt 190.0 lb

## 2011-08-11 DIAGNOSIS — R7309 Other abnormal glucose: Secondary | ICD-10-CM

## 2011-08-11 DIAGNOSIS — Q2381 Bicuspid aortic valve: Secondary | ICD-10-CM

## 2011-08-11 DIAGNOSIS — R7303 Prediabetes: Secondary | ICD-10-CM | POA: Insufficient documentation

## 2011-08-11 DIAGNOSIS — Z954 Presence of other heart-valve replacement: Secondary | ICD-10-CM

## 2011-08-11 DIAGNOSIS — Q231 Congenital insufficiency of aortic valve: Secondary | ICD-10-CM

## 2011-08-11 MED ORDER — METFORMIN HCL 500 MG PO TABS: ORAL_TABLET | ORAL | Status: DC

## 2011-08-11 MED ORDER — COUMADIN 10 MG PO TABS: ORAL_TABLET | ORAL | Status: DC

## 2011-08-11 NOTE — Patient Instructions (Signed)
Very nice to meet you. Your A1c was 5.7. This is on the upper range of potential prediabetes. I really think you can do this with diet and exercise, but I will give you some metformin to try. I when she to start with one pill daily and if you're able to tolerated increase to 2 pills daily in one week. I am also going to give you Domenick Bookbinder number you can call and make an appointment if he would like. I when she to come back and see Dr. Louanne Belton again in about one month.

## 2011-08-11 NOTE — Assessment & Plan Note (Signed)
Lab Results  Component Value Date   HGBA1C 5.7 08/11/2011   patient very motivated to change diet and exercise and did give handout the patient. Patient also given information for Dr. Verlon Setting in case she wants to make an appointment Patient will take metformin 500 mg daily for one week and increase 2000 mg daily. Followup with primary care provider in one week to make sure she's doing well.

## 2011-08-11 NOTE — Progress Notes (Signed)
  Subjective:    Patient ID: Monica Key, female    DOB: Feb 07, 1956, 57 y.o.   MRN: [id number]  HPI 56 year old female past medical history significant for hypertension as well as heart valve replacement who was told by her cardiologist that she had hyperglycemia. Patient did have a basic metabolic panel done that showed some mild hyperkalemia as well as a glucose of 120. Patient comes in today to be checked for potential diabetes. Patient denies any fevers chills any abnormal weight loss but states that over the years she has had some weight gain. Patient states she has not had any polyuria or polydipsia and no vision changes. Patient has been doing a lot of research online and would be willing to take metformin if necessary and is going to be very motivated to try to change her diet and exercise here in the near future.  Review of Systems As stated above in history of present illness    Objective:   Physical Exam Vitals reviewed General: No apparent distress alert Neck: supple. no JVD. Carotids 2+ bilat; no bruits. No lymphadenopathy or thryomegaly appreciated.  Cv: PMI nondisplaced. Regular rate & rhythm. No rubs, gallops, murmur. mechanical s2  Lungs: clear  Abdomen: soft, nontender, nondistended.Peri Jefferson bowel sounds.  Extremities: no cyanosis, clubbing, rash, edema  Neuro: alert & orientedx3, cranial nerves grossly intact. moves all 4 extremities w/o difficulty. affect pleasant    Assessment & Plan:

## 2011-08-25 ENCOUNTER — Ambulatory Visit (INDEPENDENT_AMBULATORY_CARE_PROVIDER_SITE_OTHER): Payer: BC Managed Care – PPO | Admitting: Cardiovascular Disease

## 2011-08-25 ENCOUNTER — Encounter: Payer: Self-pay | Admitting: Cardiovascular Disease

## 2011-08-25 VITALS — BP 117/67 | HR 69 | Ht 65.0 in | Wt 189.0 lb

## 2011-08-25 DIAGNOSIS — I712 Thoracic aortic aneurysm, without rupture: Secondary | ICD-10-CM

## 2011-08-25 DIAGNOSIS — Q254 Other congenital malformations of aorta: Secondary | ICD-10-CM

## 2011-08-25 DIAGNOSIS — I359 Nonrheumatic aortic valve disorder, unspecified: Secondary | ICD-10-CM

## 2011-08-25 DIAGNOSIS — R0989 Other specified symptoms and signs involving the circulatory and respiratory systems: Secondary | ICD-10-CM

## 2011-08-25 NOTE — Assessment & Plan Note (Signed)
Status post mechanical aVR with a normal functioning prosthesis. Recommend followup in 6 months. She is tolerating long-term Coumadin without problems.

## 2011-08-25 NOTE — Assessment & Plan Note (Signed)
This may be transmission of her cardiac murmur. I recommend that she undergo a carotid duplex scan to rule out significant carotid stenosis.

## 2011-08-25 NOTE — Assessment & Plan Note (Signed)
Maximum dimension 4.8 cm in diameter. This is been stable. She is followed with serial imaging studies. Blood pressure is well controlled and she is appropriately on a beta blocker.

## 2011-08-25 NOTE — Progress Notes (Signed)
   HPI:  This is a 56 year old woman presenting for followup evaluation. She has a history of bicuspid aortic valve status post mechanical aVR in 1999. The patient has a dilated descending aorta and she is followed with serial imaging studies by Dr. Laneta Simmers.  The most recent study from April 2012 was an MRA demonstrating a maximal dimension of 4.8 cm in the mid ascending aorta. This was stable from her previous study.  The patient has tolerated long-term warfarin without problems. She is concerned that she has developed prediabetes. She's had difficulty controlling her weight. She was recently started on metformin. Overall she feels pretty well and she denies chest pain or pressure. She denies dyspnea, edema, orthopnea, palpitations, or PND. She is compliant with her medicines. She's been maintained on a beta blocker because of her dilated aortic root.  Outpatient Encounter Prescriptions as of 08/25/2011  Medication Sig Dispense Refill  . aspirin 81 MG EC tablet Take 81 mg by mouth daily.        . carvedilol (COREG) 3.125 MG tablet Take 1 tablet (3.125 mg total) by mouth 2 (two) times daily.  60 tablet  12  . Cholecalciferol (VITAMIN D) 1000 UNITS capsule Take 1,000 Units by mouth daily.        Marland Kitchen co-enzyme Q-10 30 MG capsule Take 30 mg by mouth daily as needed.        Marland Kitchen COUMADIN 10 MG tablet Take as directed by coumadin clinic  30 tablet  3  . Fish Oil OIL Take 3 g/mL by mouth daily.        . metFORMIN (GLUCOPHAGE) 500 MG tablet Take one pill daily for the first week and 2 pills daily thereafter.  180 tablet  3  . Milk Thistle-Turmeric (SILYMARIN) CAPS Take by mouth daily as needed.        . simvastatin (ZOCOR) 40 MG tablet Take 1 tablet (40 mg total) by mouth at bedtime.  30 tablet  1  . warfarin (COUMADIN) 7.5 MG tablet Take as directed by Anticoagulation Clinic.  30 tablet  3    Allergies  Allergen Reactions  . Hydralazine Hcl   . Penicillins   . Sulfonamide Derivatives   . Vancomycin      No past medical history on file.  ROS: Negative except as per HPI  BP 117/67  Pulse 69  Ht 5\' 5"  (1.651 m)  Wt 85.73 kg (189 lb)  BMI 31.45 kg/m2  PHYSICAL EXAM: Pt is alert and oriented, NAD HEENT: normal Neck: JVP - normal, carotids 2+= with bilateral bruits Lungs: CTA bilaterally CV: RRR with grade 2/6 systolic ejection murmur along the left sternal border with crisp mechanical aortic closure sounds Abd: soft, NT, Positive BS, no hepatomegaly Ext: no C/C/E, distal pulses intact and equal Skin: warm/dry no rash  EKG:  Normal sinus rhythm 64 beats per minute, rightward axis, otherwise within normal limits.  ASSESSMENT AND PLAN:

## 2011-08-25 NOTE — Patient Instructions (Signed)

## 2011-09-01 ENCOUNTER — Ambulatory Visit (INDEPENDENT_AMBULATORY_CARE_PROVIDER_SITE_OTHER): Payer: BC Managed Care – PPO | Admitting: *Deleted

## 2011-09-01 DIAGNOSIS — Q231 Congenital insufficiency of aortic valve: Secondary | ICD-10-CM

## 2011-09-01 DIAGNOSIS — Z954 Presence of other heart-valve replacement: Secondary | ICD-10-CM

## 2011-09-01 DIAGNOSIS — I359 Nonrheumatic aortic valve disorder, unspecified: Secondary | ICD-10-CM

## 2011-09-01 LAB — POCT INR: INR: 2.7

## 2011-09-21 ENCOUNTER — Encounter (INDEPENDENT_AMBULATORY_CARE_PROVIDER_SITE_OTHER): Payer: BC Managed Care – PPO

## 2011-09-21 DIAGNOSIS — R0989 Other specified symptoms and signs involving the circulatory and respiratory systems: Secondary | ICD-10-CM

## 2011-09-29 ENCOUNTER — Ambulatory Visit (INDEPENDENT_AMBULATORY_CARE_PROVIDER_SITE_OTHER): Payer: BC Managed Care – PPO | Admitting: *Deleted

## 2011-09-29 DIAGNOSIS — Q231 Congenital insufficiency of aortic valve: Secondary | ICD-10-CM

## 2011-09-29 DIAGNOSIS — Q2381 Bicuspid aortic valve: Secondary | ICD-10-CM

## 2011-09-29 DIAGNOSIS — Z954 Presence of other heart-valve replacement: Secondary | ICD-10-CM

## 2011-09-29 LAB — POCT INR: INR: 2.6

## 2011-10-01 ENCOUNTER — Other Ambulatory Visit: Payer: Self-pay | Admitting: Cardiovascular Disease

## 2011-10-01 MED ORDER — SIMVASTATIN 40 MG PO TABS: 40.0000 mg | ORAL_TABLET | Freq: Every day | ORAL | Status: DC

## 2011-10-10 ENCOUNTER — Other Ambulatory Visit (HOSPITAL_COMMUNITY): Payer: Self-pay | Admitting: Internal Medicine

## 2011-10-27 ENCOUNTER — Ambulatory Visit (INDEPENDENT_AMBULATORY_CARE_PROVIDER_SITE_OTHER): Payer: BC Managed Care – PPO | Admitting: *Deleted

## 2011-10-27 DIAGNOSIS — Q231 Congenital insufficiency of aortic valve: Secondary | ICD-10-CM

## 2011-10-27 DIAGNOSIS — Z954 Presence of other heart-valve replacement: Secondary | ICD-10-CM

## 2011-10-27 DIAGNOSIS — I359 Nonrheumatic aortic valve disorder, unspecified: Secondary | ICD-10-CM

## 2011-11-21 ENCOUNTER — Other Ambulatory Visit (HOSPITAL_COMMUNITY): Payer: Self-pay | Admitting: Internal Medicine

## 2011-11-22 NOTE — Telephone Encounter (Signed)
Fax Received. Refill Completed. Toniya Rozar Chowoe (R.M.A)   

## 2011-12-08 ENCOUNTER — Ambulatory Visit (INDEPENDENT_AMBULATORY_CARE_PROVIDER_SITE_OTHER): Payer: Federal, State, Local not specified - PPO | Admitting: *Deleted

## 2011-12-08 DIAGNOSIS — Q231 Congenital insufficiency of aortic valve: Secondary | ICD-10-CM

## 2011-12-08 DIAGNOSIS — Z954 Presence of other heart-valve replacement: Secondary | ICD-10-CM

## 2011-12-08 DIAGNOSIS — Q2381 Bicuspid aortic valve: Secondary | ICD-10-CM

## 2011-12-08 LAB — POCT INR: INR: 2.2

## 2012-01-18 ENCOUNTER — Ambulatory Visit (INDEPENDENT_AMBULATORY_CARE_PROVIDER_SITE_OTHER): Payer: Federal, State, Local not specified - PPO | Admitting: *Deleted

## 2012-01-18 DIAGNOSIS — I359 Nonrheumatic aortic valve disorder, unspecified: Secondary | ICD-10-CM

## 2012-01-18 DIAGNOSIS — Z954 Presence of other heart-valve replacement: Secondary | ICD-10-CM

## 2012-01-18 DIAGNOSIS — Q231 Congenital insufficiency of aortic valve: Secondary | ICD-10-CM

## 2012-02-02 ENCOUNTER — Encounter: Payer: Self-pay | Admitting: Family Medicine

## 2012-02-02 ENCOUNTER — Ambulatory Visit (INDEPENDENT_AMBULATORY_CARE_PROVIDER_SITE_OTHER): Payer: Federal, State, Local not specified - PPO | Admitting: *Deleted

## 2012-02-02 ENCOUNTER — Telehealth: Payer: Self-pay | Admitting: Family Medicine

## 2012-02-02 ENCOUNTER — Ambulatory Visit (INDEPENDENT_AMBULATORY_CARE_PROVIDER_SITE_OTHER): Payer: Federal, State, Local not specified - PPO | Admitting: Family Medicine

## 2012-02-02 VITALS — BP 141/77 | HR 79 | Temp 97.9°F | Ht 65.0 in | Wt 185.0 lb

## 2012-02-02 DIAGNOSIS — R319 Hematuria, unspecified: Secondary | ICD-10-CM

## 2012-02-02 DIAGNOSIS — Q2381 Bicuspid aortic valve: Secondary | ICD-10-CM

## 2012-02-02 DIAGNOSIS — Z954 Presence of other heart-valve replacement: Secondary | ICD-10-CM

## 2012-02-02 DIAGNOSIS — Q231 Congenital insufficiency of aortic valve: Secondary | ICD-10-CM

## 2012-02-02 DIAGNOSIS — N390 Urinary tract infection, site not specified: Secondary | ICD-10-CM

## 2012-02-02 LAB — CBC WITH DIFFERENTIAL/PLATELET
Basophils Relative: 0 % (ref 0–1)
HCT: 39.9 % (ref 36.0–46.0)
Hemoglobin: 13.5 g/dL (ref 12.0–15.0)
Lymphs Abs: 1.8 10*3/uL (ref 0.7–4.0)
MCH: 30.1 pg (ref 26.0–34.0)
MCHC: 33.8 g/dL (ref 30.0–36.0)
Monocytes Absolute: 0.4 10*3/uL (ref 0.1–1.0)
Monocytes Relative: 4 % (ref 3–12)
Neutro Abs: 6.1 10*3/uL (ref 1.7–7.7)

## 2012-02-02 LAB — POCT URINALYSIS DIPSTICK
Bilirubin, UA: NEGATIVE
Glucose, UA: NEGATIVE
Spec Grav, UA: <=1.005

## 2012-02-02 LAB — POCT INR: INR: 2.3

## 2012-02-02 LAB — POCT UA - MICROSCOPIC ONLY

## 2012-02-02 MED ORDER — CEPHALEXIN 500 MG PO TABS: 500.0000 mg | ORAL_TABLET | Freq: Four times a day (QID) | ORAL | Status: DC

## 2012-02-02 NOTE — Patient Instructions (Addendum)
The most likely explanation for your symptoms is a urinary tract infection which is causing some bleeding. Since you are on chronic anticoagulation, your bleeding is heavier than if you were not on anticoagulation.   I am starting you on an antibiotic, I have ordered a CBC to look at your hemoglobin level, I have ordered an INR check to make sure you're still in good range, and I am ordering a urine culture.   If your symptoms do not start to improve within the next 24 hours she needs to call us and be seen again. If your symptoms worsen, that is fever, chills, increased abdominal pain, increased amount of bleeding, then you need to be seen at the emergency room.   I would like to see you back here just for a quick followup in 7-10 days. You can schedule that appointment with me or with hyourregular provider. I will send you a note about your blood work.

## 2012-02-02 NOTE — Progress Notes (Signed)
  Subjective:    Patient ID: Monica Key, female    DOB: [birth year], 56 y.o.   MRN: [id number]  HPI  Did some bladder discomfort this morning when she got out of bed. It felt a lot like pressure. When she urinated she had blood in her urine. Since then she's had increased frequency of urination. No true dysuria but a lot of pelvic pressure. She now has frank blood in her urine every time. She's also having occasional spotting on her underwear. The amount of spotting is small 1-2 mm diameter spots. When she urinates that she has some "clots" in her urine. Has not had a urinary tract infection in many many years. PERTINENT  PMH / PSH: Chronic anticoagulation for bicuspid valve. Pre diabetes  Review of Systems Denies back pain or abdominal pain. Denies fever, sweats, chills.      Objective:   Physical Exam Vital signs reviewed. GENERAL: Well developed, well nourished, no acute distress ABDOMEN: Soft positive bowel sounds. Nontender nondistended. No rebound or guarding. BACK: No CVA tenderness.       Assessment & Plan:  #1. Dysuria #2. Hematuria unknown etiology #3. History of heart valve replacement with chronic anticoagulation.  PLAN: Urinalysis and urine culture. Recheck INR and will get hemoglobin level. We'll start her on antibiotic therapy. Precautions given, please see patient instructions. She was quite anxious and told me  that she was already worried that she had some type of cancer. We discussed that. Hopefully I allayed her fears;  this is most likely just a urinary tract infection with a simple etiology,mildly  complicated by anticoagulation. Her INR is 2.3. No medication changes to her anticoagulation. She will be on antibiotics that can interfere with that so I would recommend we repeat her INR in about a week.

## 2012-02-02 NOTE — Telephone Encounter (Signed)
Please call Mrs. Krabbenhoft back before end of day.  Need to speak with nurse regarding some concerns she need answers to

## 2012-02-03 ENCOUNTER — Other Ambulatory Visit: Payer: Self-pay | Admitting: Family Medicine

## 2012-02-03 ENCOUNTER — Encounter: Payer: Self-pay | Admitting: Family Medicine

## 2012-02-03 DIAGNOSIS — Z954 Presence of other heart-valve replacement: Secondary | ICD-10-CM

## 2012-02-03 NOTE — Telephone Encounter (Signed)
Called and asked pt about her concern and she wanted to know if drinking too much water would adversely affect the effectiveness of the abx. I told her that its ok for her to stay hydrated. Pt voiced understanding and agreed.Monica Key

## 2012-02-05 LAB — URINE CULTURE: Colony Count: >=100000

## 2012-02-08 ENCOUNTER — Encounter: Payer: Self-pay | Admitting: Family Medicine

## 2012-02-09 ENCOUNTER — Ambulatory Visit: Payer: Federal, State, Local not specified - PPO | Admitting: Family Medicine

## 2012-03-01 ENCOUNTER — Ambulatory Visit (INDEPENDENT_AMBULATORY_CARE_PROVIDER_SITE_OTHER): Payer: Federal, State, Local not specified - PPO | Admitting: *Deleted

## 2012-03-01 DIAGNOSIS — Z954 Presence of other heart-valve replacement: Secondary | ICD-10-CM

## 2012-03-01 DIAGNOSIS — Q231 Congenital insufficiency of aortic valve: Secondary | ICD-10-CM

## 2012-03-01 LAB — POCT INR: INR: 3

## 2012-03-22 ENCOUNTER — Other Ambulatory Visit: Payer: Self-pay | Admitting: Family Medicine

## 2012-03-22 DIAGNOSIS — Z1231 Encounter for screening mammogram for malignant neoplasm of breast: Secondary | ICD-10-CM

## 2012-04-11 ENCOUNTER — Ambulatory Visit: Payer: Federal, State, Local not specified - PPO | Admitting: Cardiovascular Disease

## 2012-04-17 ENCOUNTER — Ambulatory Visit (INDEPENDENT_AMBULATORY_CARE_PROVIDER_SITE_OTHER): Payer: Federal, State, Local not specified - PPO | Admitting: *Deleted

## 2012-04-17 DIAGNOSIS — Q231 Congenital insufficiency of aortic valve: Secondary | ICD-10-CM

## 2012-04-17 DIAGNOSIS — Z954 Presence of other heart-valve replacement: Secondary | ICD-10-CM

## 2012-04-17 LAB — POCT INR: INR: 2.7

## 2012-04-20 ENCOUNTER — Ambulatory Visit (HOSPITAL_COMMUNITY)
Admission: RE | Admit: 2012-04-20 | Discharge: 2012-04-20 | Disposition: A | Discharge status: Home / Self Care | Payer: Federal, State, Local not specified - PPO | Source: Ambulatory Visit | Attending: Family Medicine | Admitting: Family Medicine

## 2012-04-20 DIAGNOSIS — Z1231 Encounter for screening mammogram for malignant neoplasm of breast: Secondary | ICD-10-CM | POA: Insufficient documentation

## 2012-05-07 ENCOUNTER — Other Ambulatory Visit: Payer: Self-pay | Admitting: Internal Medicine

## 2012-05-15 ENCOUNTER — Other Ambulatory Visit: Payer: Self-pay | Admitting: Internal Medicine

## 2012-05-15 ENCOUNTER — Telehealth: Payer: Self-pay | Admitting: Cardiovascular Disease

## 2012-05-15 DIAGNOSIS — E7800 Pure hypercholesterolemia, unspecified: Secondary | ICD-10-CM

## 2012-05-15 DIAGNOSIS — I359 Nonrheumatic aortic valve disorder, unspecified: Secondary | ICD-10-CM

## 2012-05-15 NOTE — Telephone Encounter (Signed)
New Problem:    Patient called in wanting to have some blood work drawn so she can discuss the results with Dr. Excell Seltzer on 05/19/11.  Please call back.

## 2012-05-15 NOTE — Telephone Encounter (Signed)
The pt would like to have her labs drawn prior to appt.  The pt is due for a lipid, liver, bmp and CBC. The pt will have these drawn tomorrow.

## 2012-05-16 ENCOUNTER — Other Ambulatory Visit (INDEPENDENT_AMBULATORY_CARE_PROVIDER_SITE_OTHER): Payer: Federal, State, Local not specified - PPO

## 2012-05-16 DIAGNOSIS — I359 Nonrheumatic aortic valve disorder, unspecified: Secondary | ICD-10-CM

## 2012-05-16 DIAGNOSIS — E7800 Pure hypercholesterolemia, unspecified: Secondary | ICD-10-CM

## 2012-05-16 LAB — CBC WITH DIFFERENTIAL/PLATELET
Basophils Absolute: 0 10*3/uL (ref 0.0–0.1)
Hemoglobin: 13.6 g/dL (ref 12.0–15.0)
Lymphocytes Relative: 38.9 % (ref 12.0–46.0)
Monocytes Relative: 7.4 % (ref 3.0–12.0)
Neutro Abs: 2.1 10*3/uL (ref 1.4–7.7)
RBC: 4.38 Mil/uL (ref 3.87–5.11)
RDW: 13.5 % (ref 11.5–14.6)

## 2012-05-16 LAB — HEPATIC FUNCTION PANEL
ALT: 15 U/L (ref 0–35)
AST: 14 U/L (ref 0–37)
Bilirubin, Direct: 0.1 mg/dL (ref 0.0–0.3)
Total Bilirubin: 1.4 mg/dL — ABNORMAL HIGH (ref 0.3–1.2)

## 2012-05-16 LAB — BASIC METABOLIC PANEL
CO2: 26 mEq/L (ref 19–32)
Chloride: 108 mEq/L (ref 96–112)
Creatinine, Ser: 0.7 mg/dL (ref 0.4–1.2)
Potassium: 4 mEq/L (ref 3.5–5.1)

## 2012-05-16 LAB — LIPID PANEL
LDL Cholesterol: 66 mg/dL (ref 0–99)
Total CHOL/HDL Ratio: 3

## 2012-05-18 ENCOUNTER — Ambulatory Visit (INDEPENDENT_AMBULATORY_CARE_PROVIDER_SITE_OTHER): Payer: Federal, State, Local not specified - PPO | Admitting: Cardiovascular Disease

## 2012-05-18 ENCOUNTER — Ambulatory Visit (INDEPENDENT_AMBULATORY_CARE_PROVIDER_SITE_OTHER): Payer: Federal, State, Local not specified - PPO | Admitting: Pharmacist

## 2012-05-18 ENCOUNTER — Encounter: Payer: Self-pay | Admitting: Cardiovascular Disease

## 2012-05-18 VITALS — BP 121/75 | HR 72 | Ht 65.0 in | Wt 170.0 lb

## 2012-05-18 DIAGNOSIS — Z954 Presence of other heart-valve replacement: Secondary | ICD-10-CM

## 2012-05-18 DIAGNOSIS — Q231 Congenital insufficiency of aortic valve: Secondary | ICD-10-CM

## 2012-05-18 DIAGNOSIS — I350 Nonrheumatic aortic (valve) stenosis: Secondary | ICD-10-CM

## 2012-05-18 DIAGNOSIS — I359 Nonrheumatic aortic valve disorder, unspecified: Secondary | ICD-10-CM

## 2012-05-18 NOTE — Progress Notes (Signed)
HPI:  This is a 57 year old woman presenting for followup evaluation. She has a history of bicuspid aortic valve status post mechanical AVR in 1999. The patient has a dilated descending aorta and she is followed with serial imaging studies by Dr. Laneta Simmers. The most recent study from April 2012 was an MRA demonstrating a maximal dimension of 4.8 cm in the mid ascending aorta. This was stable from her previous study.  She had palpitations last night - didn't feel irregular but noted fast heart rate. No associated symptoms. Denies chest pain, pressure, dyspnea, edema, orthopnea, or PND. No excess caffeine or alcohol. No other complaints.  Outpatient Encounter Prescriptions as of 05/18/2012  Medication Sig Dispense Refill  . aspirin 81 MG EC tablet Take 81 mg by mouth daily.        . carvedilol (COREG) 3.125 MG tablet TAKE 1 TABLET TWICE DAILY.  60 tablet  3  . Cholecalciferol (VITAMIN D) 1000 UNITS capsule Take 1,000 Units by mouth daily.        Marland Kitchen co-enzyme Q-10 30 MG capsule Take 30 mg by mouth daily as needed.        . Fish Oil OIL Take 3 g/mL by mouth daily.        . metFORMIN (GLUCOPHAGE) 500 MG tablet Take one tab twice a day      . Milk Thistle-Turmeric (SILYMARIN) CAPS Take by mouth daily as needed.        . simvastatin (ZOCOR) 40 MG tablet Take 1 tablet (40 mg total) by mouth at bedtime.  30 tablet  8  . warfarin (COUMADIN) 5 MG tablet As directed      . [DISCONTINUED] metFORMIN (GLUCOPHAGE) 500 MG tablet Take one pill daily for the first week and 2 pills daily thereafter.  180 tablet  3  . [DISCONTINUED] Cephalexin 500 MG tablet Take 1 tablet (500 mg total) by mouth 4 (four) times daily.  40 tablet  0  . [DISCONTINUED] COUMADIN 10 MG tablet Take as directed by coumadin clinic  30 tablet  3  . [DISCONTINUED] COUMADIN 7.5 MG tablet TAKE AS DIRECTED BY COUMADIN CLINIC.  30 tablet  2    Allergies  Allergen Reactions  . Hydralazine Hcl   . Penicillins   . Sulfonamide Derivatives   .  Vancomycin     No past medical history on file.  ROS: Negative except as per HPI  BP 121/75  Pulse 72  Ht 5\' 5"  (1.651 m)  Wt 77.111 kg (170 lb)  BMI 28.29 kg/m2  PHYSICAL EXAM: Pt is alert and oriented, NAD HEENT: normal Neck: JVP - normal, carotids 2+= without bruits Lungs: CTA bilaterally CV: RRR with crisp mechanical aortic valve closure sounds Abd: soft, NT, Positive BS, no hepatomegaly Ext: no C/C/E, distal pulses intact and equal Skin: warm/dry no rash  EKG:  NSR, HR 58 bpm, nonspecific T wave abnormality  MRA Chest 09/01/2010: Findings: There is a fusiform aneurysm of the ascending aorta.  Aortic root 3.8 cm maximal transverse diameter, mid ascending aorta  4.8 cm, proximal arch 4.2 cm, distal arch 2.9 cm, proximal  descending 2.4 cm, distal descending 2 cm just above the diaphragm.  No evidence of interval enlargement since prior exam.  There is classic three-vessel brachiocephalic arterial anatomy  without proximal stenosis, only mild tortuosity. No evidence of  dissection.  Changes of median sternotomy and aortic valve replacement surgery.  No pleural or pericardial effusion. No hilar mediastinal  adenopathy evident. Visualized portions of upper abdomen  unremarkable. Visualized bones unremarkable.  IMPRESSION:  1. Stable 4.8 cm ascending aortic aneurysm.  Original Report Authenticated By: Osa Craver, M.D  Holter Monitor (11/20/2005): NSR with rare PAC's  ASSESSMENT AND PLAN: 1. Ascending Thoracic Aortic Aneurysm: last MRA reviewed. Will repeat in April as it has been 2 years since previous study.  2. Aortic Stenosis (bicuspid) s/p mechanical AVR. Pt on warfarin and ASA. Tolerating well. No cardiac symptoms.  3. Hyperlipidemia. Lipids reviewed and excellent. Continue statin drug.  4. Palpitations. Prior Holter reviewed and unremarkable. Observe for now, if progressive palps will repeat Holter - she will notify us.   Follow-up in 6  months.  Tonny Bollman 05/18/2012 9:36 AM

## 2012-05-18 NOTE — Patient Instructions (Addendum)
MRA Chest at [facility].  Your physician wants you to follow-up in: 6 months with Dr. Excell Seltzer. You will receive a reminder letter in the mail two months in advance. If you don't receive a letter, please call our office to schedule the follow-up appointment.

## 2012-05-25 ENCOUNTER — Other Ambulatory Visit: Payer: Self-pay

## 2012-05-25 ENCOUNTER — Encounter: Payer: Self-pay | Admitting: Cardiovascular Disease

## 2012-05-25 MED ORDER — WARFARIN SODIUM 10 MG PO TABS: ORAL_TABLET | ORAL | Status: DC

## 2012-06-16 ENCOUNTER — Ambulatory Visit: Payer: Federal, State, Local not specified - PPO | Admitting: Family Medicine

## 2012-06-19 ENCOUNTER — Other Ambulatory Visit: Payer: Self-pay | Admitting: *Deleted

## 2012-06-19 MED ORDER — SIMVASTATIN 40 MG PO TABS: 40.0000 mg | ORAL_TABLET | Freq: Every day | ORAL | Status: DC

## 2012-06-28 ENCOUNTER — Ambulatory Visit (INDEPENDENT_AMBULATORY_CARE_PROVIDER_SITE_OTHER): Payer: Federal, State, Local not specified - PPO | Admitting: Pharmacist

## 2012-06-28 DIAGNOSIS — Z954 Presence of other heart-valve replacement: Secondary | ICD-10-CM

## 2012-06-28 DIAGNOSIS — Q231 Congenital insufficiency of aortic valve: Secondary | ICD-10-CM

## 2012-06-28 LAB — POCT INR: INR: 2.3

## 2012-08-09 ENCOUNTER — Encounter (HOSPITAL_COMMUNITY): Payer: Self-pay | Admitting: Emergency Medicine

## 2012-08-09 ENCOUNTER — Emergency Department (HOSPITAL_COMMUNITY)
Admission: EM | Admit: 2012-08-09 | Discharge: 2012-08-09 | Disposition: A | Discharge status: Home / Self Care | Payer: Federal, State, Local not specified - PPO | Attending: Emergency Medicine | Admitting: Emergency Medicine

## 2012-08-09 ENCOUNTER — Emergency Department (HOSPITAL_COMMUNITY): Payer: Federal, State, Local not specified - PPO

## 2012-08-09 DIAGNOSIS — Z8669 Personal history of other diseases of the nervous system and sense organs: Secondary | ICD-10-CM | POA: Insufficient documentation

## 2012-08-09 DIAGNOSIS — E119 Type 2 diabetes mellitus without complications: Secondary | ICD-10-CM | POA: Insufficient documentation

## 2012-08-09 DIAGNOSIS — I10 Essential (primary) hypertension: Secondary | ICD-10-CM | POA: Insufficient documentation

## 2012-08-09 DIAGNOSIS — R109 Unspecified abdominal pain: Secondary | ICD-10-CM | POA: Insufficient documentation

## 2012-08-09 DIAGNOSIS — R197 Diarrhea, unspecified: Secondary | ICD-10-CM | POA: Insufficient documentation

## 2012-08-09 DIAGNOSIS — Z79899 Other long term (current) drug therapy: Secondary | ICD-10-CM | POA: Insufficient documentation

## 2012-08-09 DIAGNOSIS — R112 Nausea with vomiting, unspecified: Secondary | ICD-10-CM

## 2012-08-09 DIAGNOSIS — Z7982 Long term (current) use of aspirin: Secondary | ICD-10-CM | POA: Insufficient documentation

## 2012-08-09 HISTORY — DX: Type 2 diabetes mellitus without complications: E11.9

## 2012-08-09 HISTORY — DX: Injury of unspecified nerves of neck, initial encounter: S14.9XXA

## 2012-08-09 HISTORY — DX: Essential (primary) hypertension: I10

## 2012-08-09 HISTORY — DX: Mononeuropathy, unspecified: G58.9

## 2012-08-09 LAB — CBC WITH DIFFERENTIAL/PLATELET
HCT: 39.4 % (ref 36.0–46.0)
Hemoglobin: 13.4 g/dL (ref 12.0–15.0)
Lymphocytes Relative: 2 % — ABNORMAL LOW (ref 12–46)
Lymphs Abs: 0.2 10*3/uL — ABNORMAL LOW (ref 0.7–4.0)
MCHC: 34 g/dL (ref 30.0–36.0)
Monocytes Absolute: 0.2 10*3/uL (ref 0.1–1.0)
Monocytes Relative: 2 % — ABNORMAL LOW (ref 3–12)
Neutro Abs: 10.5 10*3/uL — ABNORMAL HIGH (ref 1.7–7.7)
RBC: 4.47 MIL/uL (ref 3.87–5.11)
WBC: 10.9 10*3/uL — ABNORMAL HIGH (ref 4.0–10.5)

## 2012-08-09 LAB — COMPREHENSIVE METABOLIC PANEL
Alkaline Phosphatase: 63 U/L (ref 39–117)
BUN: 20 mg/dL (ref 6–23)
CO2: 26 mEq/L (ref 19–32)
Chloride: 106 mEq/L (ref 96–112)
Creatinine, Ser: 0.62 mg/dL (ref 0.50–1.10)
GFR calc non Af Amer: >90 mL/min (ref >90)
Glucose, Bld: 138 mg/dL — ABNORMAL HIGH (ref 70–99)
Potassium: 4.2 mEq/L (ref 3.5–5.1)
Total Bilirubin: 1.7 mg/dL — ABNORMAL HIGH (ref 0.3–1.2)

## 2012-08-09 LAB — PROTIME-INR: Prothrombin Time: 21.2 seconds — ABNORMAL HIGH (ref 11.6–15.2)

## 2012-08-09 LAB — LIPASE, BLOOD: Lipase: 21 U/L (ref 11–59)

## 2012-08-09 MED ORDER — ONDANSETRON 8 MG PO TBDP: 8.0000 mg | ORAL_TABLET | Freq: Three times a day (TID) | ORAL | Status: DC | PRN

## 2012-08-09 MED ORDER — ONDANSETRON HCL 4 MG/2ML IJ SOLN
4.0000 mg | Freq: Once | INTRAMUSCULAR | Status: AC
  Administered 2012-08-09: 4 mg via INTRAVENOUS
  Filled 2012-08-09: qty 2

## 2012-08-09 MED ORDER — SODIUM CHLORIDE 0.9 % IV BOLUS (SEPSIS)
1000.0000 mL | Freq: Once | INTRAVENOUS | Status: AC
  Administered 2012-08-09: 1000 mL via INTRAVENOUS

## 2012-08-09 MED ORDER — ONDANSETRON HCL 4 MG/2ML IJ SOLN: 4.0000 mg | Freq: Once | INTRAMUSCULAR | Status: DC

## 2012-08-09 NOTE — ED Notes (Signed)
Patient states "I poisoned myself.  I ate 8 raw potatoes from 1630 to 2030.  I just didn't quit.  I saw online that it's toxic".   Patient claims has been throwing up since midnight with two episodes of loose stool.

## 2012-08-09 NOTE — ED Notes (Signed)
Patient states "I feel a lot better now".

## 2012-08-09 NOTE — ED Provider Notes (Signed)
History     CSN: [id number]  Arrival date & time 08/09/12  4098   First MD Initiated Contact with Patient 08/09/12 0901      Chief Complaint  Patient presents with  . Emesis  . Diarrhea  . Nausea    (Consider location/radiation/quality/duration/timing/severity/associated sxs/prior treatment) HPI [place] is a 57 y.o. female who presents to ED with complaint of nausea, vomiting, diarrhea onset over night. States symptoms started with nausea and vomiting, states more then 10 episodes. Followed by watery diarrhea. No blood in stool or emesis. States she did eat 8 raw potatoes and about 2-3 cooked potatoes yesterday for dinner. States "i just like how raw potatoes taste and i could not stop eating them. Denies chest pain, denies abdominal pain. Took peptobismal when symptoms started which she states she threw up. No hx of the same. No prior abdominal surgeries. No fever, chills. No urinary or vaginal complaints.    Past Medical History  Diagnosis Date  . Diabetes mellitus without complication   . Hypertension   . Pinched nerve in neck     Past Surgical History  Procedure Laterality Date  . Cardiac surgery      No family history on file.  History  Substance Use Topics  . Smoking status: Never Smoker   . Smokeless tobacco: Not on file  . Alcohol Use: No    OB History   Grav Para Term Preterm Abortions TAB SAB Ect Mult Living                  Review of Systems  Constitutional: Negative for fever and chills.  HENT: Negative for neck pain and neck stiffness.   Respiratory: Negative.   Cardiovascular: Negative.   Gastrointestinal: Positive for nausea, vomiting, abdominal pain and diarrhea. Negative for blood in stool.  Genitourinary: Negative for dysuria and flank pain.  All other systems reviewed and are negative.    Allergies  Hydralazine hcl; Penicillins; Sulfonamide derivatives; and Vancomycin  Home Medications   Current Outpatient Rx  Name  Route  Sig   Dispense  Refill  . aspirin 81 MG EC tablet   Oral   Take 81 mg by mouth daily.           . carvedilol (COREG) 3.125 MG tablet      TAKE 1 TABLET TWICE DAILY.   60 tablet   3   . Cholecalciferol (VITAMIN D) 1000 UNITS capsule   Oral   Take 1,000 Units by mouth daily.           Marland Kitchen co-enzyme Q-10 30 MG capsule   Oral   Take 30 mg by mouth daily.          . metFORMIN (GLUCOPHAGE) 500 MG tablet      Take one tab twice a day         . Milk Thistle-Turmeric (SILYMARIN) CAPS   Oral   Take 1 capsule by mouth daily.          . simvastatin (ZOCOR) 40 MG tablet   Oral   Take 1 tablet (40 mg total) by mouth at bedtime.   30 tablet   8   . warfarin (COUMADIN) 10 MG tablet   Oral   Take 10 mg by mouth every Monday, Wednesday, and Friday.         . warfarin (COUMADIN) 7.5 MG tablet   Oral   Take 7.5 mg by mouth 4 (four) times a week. Tuesday, Thursday, Saturday,  Sunday           BP 134/65  Pulse 96  Temp(Src) 98.4 F (36.9 C) (Oral)  Resp 18  Ht 5\' 5"  (1.651 m)  Wt 170 lb (77.111 kg)  BMI 28.29 kg/m2  SpO2 100%  Physical Exam  Nursing note and vitals reviewed. Constitutional: She is oriented to person, place, and time. She appears well-developed and well-nourished. No distress.  Eyes: Conjunctivae are normal.  Neck: Neck supple.  Cardiovascular: Normal rate, regular rhythm and normal heart sounds.   Pulmonary/Chest: Effort normal and breath sounds normal. No respiratory distress. She has no wheezes. She has no rales.  Abdominal: Soft. Bowel sounds are normal. She exhibits no distension. There is no tenderness. There is no rebound.  Musculoskeletal: She exhibits no edema.  Neurological: She is alert and oriented to person, place, and time.  Skin: Skin is warm and dry. No rash noted. No erythema.    ED Course  Procedures (including critical care time)   Date: 08/09/2012  Rate: 91  Rhythm: normal sinus rhythm  QRS Axis: right  Intervals: normal   ST/T Wave abnormalities: nonspecific ST/T changes  Conduction Disutrbances:none  Narrative Interpretation:   Old EKG Reviewed: unchanged  Results for orders placed during the hospital encounter of 08/09/12  CBC WITH DIFFERENTIAL      Result Value Range   WBC 10.9 (*) 4.0 - 10.5 K/uL   RBC 4.47  3.87 - 5.11 MIL/uL   Hemoglobin 13.4  12.0 - 15.0 g/dL   HCT 16.1  09.6 - 04.5 %   MCV 88.1  78.0 - 100.0 fL   MCH 30.0  26.0 - 34.0 pg   MCHC 34.0  30.0 - 36.0 g/dL   RDW 40.9  81.1 - 91.4 %   Platelets 175  150 - 400 K/uL   Neutrophils Relative 96 (*) 43 - 77 %   Neutro Abs 10.5 (*) 1.7 - 7.7 K/uL   Lymphocytes Relative 2 (*) 12 - 46 %   Lymphs Abs 0.2 (*) 0.7 - 4.0 K/uL   Monocytes Relative 2 (*) 3 - 12 %   Monocytes Absolute 0.2  0.1 - 1.0 K/uL   Eosinophils Relative 0  0 - 5 %   Eosinophils Absolute 0.0  0.0 - 0.7 K/uL   Basophils Relative 0  0 - 1 %   Basophils Absolute 0.0  0.0 - 0.1 K/uL  COMPREHENSIVE METABOLIC PANEL      Result Value Range   Sodium 143  135 - 145 mEq/L   Potassium 4.2  3.5 - 5.1 mEq/L   Chloride 106  96 - 112 mEq/L   CO2 26  19 - 32 mEq/L   Glucose, Bld 138 (*) 70 - 99 mg/dL   BUN 20  6 - 23 mg/dL   Creatinine, Ser 7.82  0.50 - 1.10 mg/dL   Calcium 8.8  8.4 - 95.6 mg/dL   Total Protein 6.8  6.0 - 8.3 g/dL   Albumin 3.8  3.5 - 5.2 g/dL   AST 17  0 - 37 U/L   ALT 13  0 - 35 U/L   Alkaline Phosphatase 63  39 - 117 U/L   Total Bilirubin 1.7 (*) 0.3 - 1.2 mg/dL   GFR calc non Af Amer >90  >90 mL/min   GFR calc Af Amer >90  >90 mL/min  LIPASE, BLOOD      Result Value Range   Lipase 21  11 - 59 U/L  PROTIME-INR  Result Value Range   Prothrombin Time 21.2 (*) 11.6 - 15.2 seconds   INR 1.92 (*) 0.00 - 1.49   Dg Abd 2 Views  08/09/2012  *RADIOLOGY REPORT*  Clinical Data: Vomiting  ABDOMEN - 2 VIEW  Comparison: None.  Findings: There is nonspecific nonobstructive bowel gas pattern. No free abdominal air.  Mild gaseous distention of the hepatic flexure of  the colon. Mild levoscoliosis lumbar spine.  IMPRESSION: Nonspecific nonobstructive bowel gas pattern.  Mild gaseous distention hepatic flexure of the colon.   Original Report Authenticated By: Natasha Mead, M.D.     12:39 PM Pt rehydrated. Abdomen non tender. She has been tolerating PO fluids in ED. She is also ambulatory to and from the bathroom with no issues. Do not think further imaging indicated   1. Nausea vomiting and diarrhea       MDM  PT with nausea, vomiting, diarrhea. No abdominal pain. abd 2 view negative. Labs unremarkable other than bilirubin of 1.7, which is recurrent for pt. She was rehydrated with IVF. Zofran for nausea, which has helped her symptoms. She feels much better. She continues not to have any abdominal pain. torelating POs. Will d/c home with zofran, fluids PO at home. Follow up as needed. Return precautions given.   Filed Vitals:   08/09/12 0900 08/09/12 0915 08/09/12 1000 08/09/12 1053  BP: 119/67 109/47  107/70  Pulse: 101 102 88 90  Temp:      TempSrc:      Resp:   17 21  Height:      Weight:      SpO2: 99% 98% 94% 94%           Mirely Pangle A Oliver Neuwirth, PA-C 08/09/12 1447

## 2012-08-09 NOTE — ED Provider Notes (Signed)
  Medical screening examination/treatment/procedure(s) were performed by non-physician practitioner and as supervising physician I was immediately available for consultation/collaboration.    Gerhard Munch, MD 08/09/12 1536

## 2012-08-09 NOTE — ED Notes (Signed)
Patient to radiology.

## 2012-08-10 ENCOUNTER — Ambulatory Visit (INDEPENDENT_AMBULATORY_CARE_PROVIDER_SITE_OTHER): Payer: Federal, State, Local not specified - PPO | Admitting: Cardiology

## 2012-08-10 DIAGNOSIS — Q231 Congenital insufficiency of aortic valve: Secondary | ICD-10-CM

## 2012-08-10 DIAGNOSIS — Z954 Presence of other heart-valve replacement: Secondary | ICD-10-CM

## 2012-08-30 ENCOUNTER — Ambulatory Visit (INDEPENDENT_AMBULATORY_CARE_PROVIDER_SITE_OTHER): Payer: Federal, State, Local not specified - PPO | Admitting: *Deleted

## 2012-08-30 DIAGNOSIS — Z954 Presence of other heart-valve replacement: Secondary | ICD-10-CM

## 2012-08-30 DIAGNOSIS — Q2381 Bicuspid aortic valve: Secondary | ICD-10-CM

## 2012-08-30 DIAGNOSIS — Q231 Congenital insufficiency of aortic valve: Secondary | ICD-10-CM

## 2012-09-04 ENCOUNTER — Ambulatory Visit
Admission: RE | Admit: 2012-09-04 | Discharge: 2012-09-04 | Disposition: A | Discharge status: Home / Self Care | Payer: Federal, State, Local not specified - PPO | Source: Ambulatory Visit | Attending: Cardiovascular Disease | Admitting: Cardiovascular Disease

## 2012-09-04 DIAGNOSIS — I350 Nonrheumatic aortic (valve) stenosis: Secondary | ICD-10-CM

## 2012-09-04 MED ORDER — GADOBENATE DIMEGLUMINE 529 MG/ML IV SOLN
15.0000 mL | Freq: Once | INTRAVENOUS | Status: AC | PRN
  Administered 2012-09-04: 15 mL via INTRAVENOUS

## 2012-09-15 ENCOUNTER — Other Ambulatory Visit: Payer: Self-pay | Admitting: *Deleted

## 2012-09-15 DIAGNOSIS — I712 Thoracic aortic aneurysm, without rupture: Secondary | ICD-10-CM

## 2012-09-17 ENCOUNTER — Other Ambulatory Visit: Payer: Self-pay | Admitting: Internal Medicine

## 2012-09-19 ENCOUNTER — Encounter: Payer: Self-pay | Admitting: Cardiovascular Disease

## 2012-09-19 NOTE — Telephone Encounter (Signed)
This encounter was created in error - please disregard.

## 2012-09-19 NOTE — Telephone Encounter (Signed)
New problem   Pt is returning your call concerning her MRA results. Please call pt

## 2012-09-20 ENCOUNTER — Ambulatory Visit (INDEPENDENT_AMBULATORY_CARE_PROVIDER_SITE_OTHER): Payer: Federal, State, Local not specified - PPO | Admitting: *Deleted

## 2012-09-20 DIAGNOSIS — Z954 Presence of other heart-valve replacement: Secondary | ICD-10-CM

## 2012-09-20 DIAGNOSIS — Q231 Congenital insufficiency of aortic valve: Secondary | ICD-10-CM

## 2012-09-20 LAB — POCT INR: INR: 2.8

## 2012-10-04 ENCOUNTER — Encounter: Payer: Self-pay | Admitting: Surgery

## 2012-10-04 ENCOUNTER — Ambulatory Visit (INDEPENDENT_AMBULATORY_CARE_PROVIDER_SITE_OTHER): Payer: Federal, State, Local not specified - PPO | Admitting: Surgery

## 2012-10-04 VITALS — BP 121/68 | HR 64 | Resp 16 | Ht 65.0 in | Wt 165.0 lb

## 2012-10-04 DIAGNOSIS — Z7189 Other specified counseling: Secondary | ICD-10-CM

## 2012-10-04 DIAGNOSIS — I712 Thoracic aortic aneurysm, without rupture: Secondary | ICD-10-CM

## 2012-10-04 DIAGNOSIS — Z712 Person consulting for explanation of examination or test findings: Secondary | ICD-10-CM

## 2012-10-11 ENCOUNTER — Encounter: Payer: Self-pay | Admitting: Surgery

## 2012-10-11 ENCOUNTER — Ambulatory Visit: Payer: Federal, State, Local not specified - PPO | Admitting: Cardiothoracic Surgery

## 2012-10-11 NOTE — Progress Notes (Signed)
[address]       [place] [address]             [phone]        HPI:  The patient returned to my office today for follow up of an  ascending aortic aneurysm.  I saw her initially on July 16, 2008, for  evaluation of this aneurysm.  I had operated on her on August 08, 1997,  performing St. Jude mechanical valve replacement for severe aortic  stenosis with bicuspid valve.  She has some enlargement of her aorta at  that time.  Decision was made not to replace her ascending aorta because it did not look that large in the OR. She had been followed by Dr. Arvilla Meres and was referred to me in 2010 for further evaluation concerning her ascending aorta.  She had an MR angiogram of the chest on June 17, 2008, that showed no  significant change in the size of her ascending aorta with a maximum  diameter about 4.7 cm.  This was a fusiform aneurysmal enlargement.  When I reviewed her previous CTs compared to this, I did not feel that  there had been any significant change since 1999.  I decided to see her  back again in April 2011 with a repeat MR angiogram of the chest to follow  up on this. This showed stable fusiform enlargement of the ascending aorta with a maximum diameter of about 4.8 cm which did not change significantly back to 1999. I planned to see her back  one year later with an MR angiogram of the chest but she was being followed closely by cardiology and did not feel like she needed to return to see me. She said she has been feeling well. She has had no problems with anticoagulation.  Blood pressure has remained under good control on the beta-blocker.  She denies any chest pain or pressure and no shortness of breath.   Current Outpatient Prescriptions  Medication Sig Dispense Refill  . aspirin 81 MG EC tablet Take 81 mg by mouth daily.        . carvedilol (COREG) 3.125 MG tablet TAKE 1 TABLET TWICE DAILY.  60 tablet  2  . Cholecalciferol (VITAMIN D)  1000 UNITS capsule Take 1,000 Units by mouth daily.        Marland Kitchen co-enzyme Q-10 30 MG capsule Take 30 mg by mouth daily.       . metFORMIN (GLUCOPHAGE) 500 MG tablet Take 500 mg by mouth 2 (two) times daily with a meal.       . Milk Thistle-Turmeric (SILYMARIN) CAPS Take 1 capsule by mouth daily.       . simvastatin (ZOCOR) 40 MG tablet Take 1 tablet (40 mg total) by mouth at bedtime.  30 tablet  8  . warfarin (COUMADIN) 10 MG tablet Take 10 mg by mouth every Monday, Wednesday, and Friday.      . warfarin (COUMADIN) 7.5 MG tablet Take 7.5 mg by mouth 4 (four) times a week. Tuesday, Thursday, Saturday, Sunday       No current facility-administered medications for this visit.     Physical Exam:  BP 121/68  Pulse 64  Resp 16  Ht 5\' 5"  (1.651 m)  Wt 165 lb (74.844 kg)  BMI 27.46 kg/m2  SpO2 98% She looks well. Cardiac exam shows a regular rate and rhythm with a crisp mechanical valve click. There is a soft systolic flow  murmur across the prosthetic valve. There is no diastolic murmur. Lung exam is clear.  Diagnostic Tests:  *RADIOLOGY REPORT*   Clinical Data:  Follow up ascending aortic aneurysm   MRA CHEST WITHOUT AND WITH CONTRAST 10/04/2012   Technique:  Angiographic images of the chest were obtained using MRA technique without and with intravenous contrast.   Contrast: 15mL MULTIHANCE GADOBENATE DIMEGLUMINE 529 MG/ML IV SOLN   Comparison:  Most recent prior MRA chest 09/01/2010   Findings:  Stable aneurysmal dilatation of the ascending thoracic aorta with a maximal transverse diameter of 4.7 cm which is not insignificantly changed compared to 4.8 cm on the prior study. Stable dilatation of the aortic root at 4.2 cm.  The proximal transverse aorta measures 3.3 cm in diameter, the distal transverse aorta measures 2.1 cm in diameter.  The proximal descending thoracic aorta measures 2.4 cm in diameter while the distal descending aorta measures 1.9 cm above the aortic hiatus.     Conventional three-vessel arch anatomy.  Small ductus diverticula again incidentally noted.  No evidence of dissection.  Heart is within normal limits for size.  No pericardial effusion.  Metallic artifact consistent with prior median sternotomy and aortic valve replacement.   No incidental abnormalities detected on the contrast enhanced images.  The visualized abdominal aorta is unremarkable. Osseous structures are unremarkable.   IMPRESSION:   Continued stability of 4.7 - 4.8 cm ascending thoracic aortic aneurysm.   Signed,   Sterling Big, MD Vascular & Interventional Radiologist [facility] Radiology     Original Report Authenticated By: Malachy Moan, M.D.     Impression:  She has a stable 4.8 cm ascending aortic aneurysm with a history of prior St. Jude aortic valve replacement for bicuspid aortic valve disease. I recommended that we repeat her MR angiogram in 1 year. She has maintained good blood pressure control with a beta blocker. This can be followed by Dr. Excell Seltzer or myself as long as the patient returns for regular followup.  Plan:  I'll see her back in 1 year with an MR angiogram of the chest.

## 2012-10-16 ENCOUNTER — Other Ambulatory Visit: Payer: Self-pay | Admitting: Internal Medicine

## 2012-10-19 ENCOUNTER — Ambulatory Visit (INDEPENDENT_AMBULATORY_CARE_PROVIDER_SITE_OTHER): Payer: Federal, State, Local not specified - PPO | Admitting: Pharmacist

## 2012-10-19 DIAGNOSIS — Z954 Presence of other heart-valve replacement: Secondary | ICD-10-CM

## 2012-10-19 DIAGNOSIS — Q231 Congenital insufficiency of aortic valve: Secondary | ICD-10-CM

## 2012-10-19 LAB — POCT INR: INR: 3.4

## 2012-11-08 ENCOUNTER — Ambulatory Visit (INDEPENDENT_AMBULATORY_CARE_PROVIDER_SITE_OTHER): Payer: Federal, State, Local not specified - PPO | Admitting: Pharmacist

## 2012-11-08 DIAGNOSIS — Z954 Presence of other heart-valve replacement: Secondary | ICD-10-CM

## 2012-11-08 DIAGNOSIS — Q231 Congenital insufficiency of aortic valve: Secondary | ICD-10-CM

## 2012-11-15 ENCOUNTER — Ambulatory Visit (INDEPENDENT_AMBULATORY_CARE_PROVIDER_SITE_OTHER): Payer: Federal, State, Local not specified - PPO | Admitting: Family Medicine

## 2012-11-15 ENCOUNTER — Encounter: Payer: Self-pay | Admitting: Family Medicine

## 2012-11-15 VITALS — BP 126/64 | HR 65 | Temp 98.0°F | Ht 65.0 in | Wt 165.0 lb

## 2012-11-15 DIAGNOSIS — I10 Essential (primary) hypertension: Secondary | ICD-10-CM

## 2012-11-15 DIAGNOSIS — R7309 Other abnormal glucose: Secondary | ICD-10-CM

## 2012-11-15 DIAGNOSIS — R7303 Prediabetes: Secondary | ICD-10-CM

## 2012-11-15 MED ORDER — METFORMIN HCL 500 MG PO TABS: 500.0000 mg | ORAL_TABLET | Freq: Two times a day (BID) | ORAL | Status: DC

## 2012-11-15 NOTE — Progress Notes (Signed)
Subjective:     Patient ID: KALEI MEDA, female   DOB: Oct 06, 1955, 57 y.o.   MRN: [id number]  HPI 57 year old female with prediabetes, HTN, and Aortic stenosis s/p valve repair who presents for follow up.  1) Pre-diabetes - Patient has significant family history for Diabetes - Last A1C was 5.7. - Patient watches her diet closely and exercises daily (elliptical, occasionally resistance training) - Patient is also on Metformin and endorses compliance. - She denies polyuria, polydipsia.  2) HTN - Compliant with Coreg and is followed by cardiology.  - HTN is stable and well controlled.   Review of Systems Per HPI    Objective:   Physical Exam Filed Vitals:   11/15/12 1502  BP: 126/64  Pulse: 65  Temp: 98 F (36.7 C)  Exam: General: well appearing, NAD. Cardiovascular: RRR. Murmur noted from mechanical valve. Respiratory: CTAB. No rales, rhonchi, or wheeze. Abdomen: soft, nontender, nondistended. Extremities: No LE edema. Skin: Warm, dry, intact.     Assessment:     See Problem List    Plan:

## 2012-11-15 NOTE — Assessment & Plan Note (Signed)
BP at goal. Will continue Coreg - Cardiology managing.

## 2012-11-15 NOTE — Assessment & Plan Note (Signed)
A1C 5.4 today. Patient is very concerned about developing DM later in life given Family history.  Will continue metformin at this time.  Metformin refilled.

## 2012-11-22 ENCOUNTER — Ambulatory Visit (INDEPENDENT_AMBULATORY_CARE_PROVIDER_SITE_OTHER): Payer: Federal, State, Local not specified - PPO | Admitting: *Deleted

## 2012-11-22 DIAGNOSIS — Q2381 Bicuspid aortic valve: Secondary | ICD-10-CM

## 2012-11-22 DIAGNOSIS — Z954 Presence of other heart-valve replacement: Secondary | ICD-10-CM

## 2012-11-22 DIAGNOSIS — Q231 Congenital insufficiency of aortic valve: Secondary | ICD-10-CM

## 2012-12-06 ENCOUNTER — Ambulatory Visit (INDEPENDENT_AMBULATORY_CARE_PROVIDER_SITE_OTHER): Payer: Federal, State, Local not specified - PPO | Admitting: Cardiovascular Disease

## 2012-12-06 ENCOUNTER — Encounter: Payer: Self-pay | Admitting: Cardiovascular Disease

## 2012-12-06 ENCOUNTER — Ambulatory Visit (INDEPENDENT_AMBULATORY_CARE_PROVIDER_SITE_OTHER): Payer: Federal, State, Local not specified - PPO | Admitting: Pharmacist

## 2012-12-06 VITALS — BP 110/76 | HR 59 | Ht 65.0 in | Wt 165.0 lb

## 2012-12-06 DIAGNOSIS — I359 Nonrheumatic aortic valve disorder, unspecified: Secondary | ICD-10-CM

## 2012-12-06 DIAGNOSIS — Q231 Congenital insufficiency of aortic valve: Secondary | ICD-10-CM

## 2012-12-06 DIAGNOSIS — Z954 Presence of other heart-valve replacement: Secondary | ICD-10-CM

## 2012-12-06 LAB — POCT INR: INR: 2.7

## 2012-12-06 NOTE — Progress Notes (Signed)
   HPI:  56 year old woman presenting for followup evaluation. The patient has a history of bicuspid aortic valve stenosis status post mechanical aortic valve replacement in 1999. She's also been followed for a dilated a standing aorta with maximal dimension about 4.8 cm. Serial imaging studies have been followed. She presents today for cardiac followup evaluation.  Overall the patient is doing well. She does complain of mild exercise intolerance. She is up and down stairs and has not been able to increase her exercise capacity. She has mild shortness of breath with 4 flights and also complains of fatigue with this. She denies chest pain or tightness, leg swelling, orthopnea, or PND. She's had no recent palpitations. She has multiple concerns about early dementia which runs in her family. Also concerned about lipid lowering, prediabetes.  Outpatient Encounter Prescriptions as of 12/06/2012  Medication Sig Dispense Refill  . aspirin 81 MG EC tablet Take 81 mg by mouth daily.        . carvedilol (COREG) 3.125 MG tablet TAKE 1 TABLET TWICE DAILY.  60 tablet  2  . Cholecalciferol (VITAMIN D) 1000 UNITS capsule Take 1,000 Units by mouth daily.        Marland Kitchen co-enzyme Q-10 30 MG capsule Take 30 mg by mouth daily.       Marland Kitchen COUMADIN 7.5 MG tablet TAKE AS DIRECTED BY COUMADIN CLINIC.  30 tablet  3  . metFORMIN (GLUCOPHAGE) 500 MG tablet Take 1 tablet (500 mg total) by mouth 2 (two) times daily with a meal.  60 tablet  12  . Milk Thistle-Turmeric (SILYMARIN) CAPS Take 1 capsule by mouth daily.       . simvastatin (ZOCOR) 40 MG tablet Take 1 tablet (40 mg total) by mouth at bedtime.  30 tablet  8  . warfarin (COUMADIN) 10 MG tablet Take 10 mg by mouth every Monday, Wednesday, and Friday.       No facility-administered encounter medications on file as of 12/06/2012.    Allergies  Allergen Reactions  . Hydralazine Hcl Rash  . Penicillins Rash  . Sulfonamide Derivatives Rash  . Vancomycin Rash    Past Medical  History  Diagnosis Date  . Diabetes mellitus without complication   . Hypertension   . Pinched nerve in neck     ROS: Negative except as per HPI  BP 110/76  Pulse 59  Ht 5\' 5"  (1.651 m)  Wt 74.844 kg (165 lb)  BMI 27.46 kg/m2  PHYSICAL EXAM: Pt is alert and oriented, NAD HEENT: normal Neck: JVP - normal, carotids 2+= without bruits Lungs: CTA bilaterally CV: RRR with crisp mechanical aortic closure sound. There is no murmur appreciable. Abd: soft, NT, Positive BS, no hepatomegaly Ext: no C/C/E, distal pulses intact and equal Skin: warm/dry no rash  ASSESSMENT AND PLAN: 1. Aortic stenosis status post mechanical aortic valve replacement. The patient will have followup imaging of her dilated aorta before she sees Dr. Laneta Simmers back next year. Her mechanical heart valve sounds are normal. I will see her back in 6 months. She remains on aspirin 81 mg and warfarin without bleeding problems.  2. Hyperlipidemia. Will check a lipomed panel to help tailor her lipid-lowering therapy.  3. Dilated aorta, ascending. MRA from January reviewed and shows stable dimensions 4.7-4.8 mm max diameter. Serial follow-up as above.   Tonny Bollman 12/06/2012 11:50 AM

## 2012-12-06 NOTE — Patient Instructions (Addendum)
Your physician wants you to follow-up in: 6 MONTHS with Dr Excell Seltzer.  You will receive a reminder letter in the mail two months in advance. If you don't receive a letter, please call our office to schedule the follow-up appointment.  Your physician recommends that you return for a FASTING LipoMed profile--nothing to eat or drink after midnight, lab opens at 7:30  Your physician recommends that you continue on your current medications as directed. Please refer to the Current Medication list given to you today.

## 2012-12-12 ENCOUNTER — Other Ambulatory Visit: Payer: Federal, State, Local not specified - PPO

## 2012-12-12 DIAGNOSIS — I359 Nonrheumatic aortic valve disorder, unspecified: Secondary | ICD-10-CM

## 2012-12-14 LAB — NMR LIPOPROFILE WITH LIPIDS
HDL Particle Number: 26.3 umol/L — ABNORMAL LOW (ref >=30.5)
HDL Size: 9.4 nm (ref >=9.2)
HDL-C: 45 mg/dL (ref >=40)
LDL (calc): 69 mg/dL (ref <100)
LP-IR Score: 45 (ref <=45)
Large HDL-P: 8 umol/L (ref >=4.8)
Triglycerides: 96 mg/dL (ref <150)

## 2012-12-17 ENCOUNTER — Other Ambulatory Visit: Payer: Self-pay | Admitting: Cardiovascular Disease

## 2013-01-10 ENCOUNTER — Ambulatory Visit (INDEPENDENT_AMBULATORY_CARE_PROVIDER_SITE_OTHER): Payer: Federal, State, Local not specified - PPO | Admitting: *Deleted

## 2013-01-10 DIAGNOSIS — Q231 Congenital insufficiency of aortic valve: Secondary | ICD-10-CM

## 2013-01-10 DIAGNOSIS — Z954 Presence of other heart-valve replacement: Secondary | ICD-10-CM

## 2013-01-10 LAB — POCT INR: INR: 2.6

## 2013-02-14 ENCOUNTER — Ambulatory Visit (INDEPENDENT_AMBULATORY_CARE_PROVIDER_SITE_OTHER): Payer: Federal, State, Local not specified - PPO | Admitting: *Deleted

## 2013-02-14 DIAGNOSIS — Z954 Presence of other heart-valve replacement: Secondary | ICD-10-CM

## 2013-02-14 DIAGNOSIS — Q231 Congenital insufficiency of aortic valve: Secondary | ICD-10-CM

## 2013-02-14 LAB — POCT INR: INR: 2

## 2013-03-18 ENCOUNTER — Other Ambulatory Visit: Payer: Self-pay | Admitting: Cardiovascular Disease

## 2013-03-21 ENCOUNTER — Ambulatory Visit (INDEPENDENT_AMBULATORY_CARE_PROVIDER_SITE_OTHER): Payer: Federal, State, Local not specified - PPO | Admitting: Pharmacist

## 2013-03-21 DIAGNOSIS — Q231 Congenital insufficiency of aortic valve: Secondary | ICD-10-CM

## 2013-03-21 DIAGNOSIS — Z954 Presence of other heart-valve replacement: Secondary | ICD-10-CM

## 2013-03-21 LAB — POCT INR: INR: 2.2

## 2013-04-11 ENCOUNTER — Other Ambulatory Visit: Payer: Self-pay | Admitting: Internal Medicine

## 2013-04-23 ENCOUNTER — Other Ambulatory Visit: Payer: Self-pay | Admitting: Pharmacist

## 2013-04-23 MED ORDER — WARFARIN SODIUM 10 MG PO TABS: ORAL_TABLET | ORAL | Status: DC

## 2013-05-11 ENCOUNTER — Ambulatory Visit (INDEPENDENT_AMBULATORY_CARE_PROVIDER_SITE_OTHER): Payer: Federal, State, Local not specified - PPO | Admitting: Pharmacist

## 2013-05-11 DIAGNOSIS — Z954 Presence of other heart-valve replacement: Secondary | ICD-10-CM

## 2013-05-11 DIAGNOSIS — Q231 Congenital insufficiency of aortic valve: Secondary | ICD-10-CM

## 2013-05-11 DIAGNOSIS — Q2381 Bicuspid aortic valve: Secondary | ICD-10-CM

## 2013-05-11 LAB — POCT INR: INR: 1.6

## 2013-05-20 ENCOUNTER — Other Ambulatory Visit: Payer: Self-pay | Admitting: Cardiovascular Disease

## 2013-05-30 ENCOUNTER — Ambulatory Visit (INDEPENDENT_AMBULATORY_CARE_PROVIDER_SITE_OTHER): Payer: Federal, State, Local not specified - PPO | Admitting: *Deleted

## 2013-05-30 DIAGNOSIS — Z954 Presence of other heart-valve replacement: Secondary | ICD-10-CM

## 2013-05-30 DIAGNOSIS — Q231 Congenital insufficiency of aortic valve: Secondary | ICD-10-CM

## 2013-05-30 LAB — POCT INR: INR: 1.9

## 2013-06-20 ENCOUNTER — Ambulatory Visit (INDEPENDENT_AMBULATORY_CARE_PROVIDER_SITE_OTHER): Payer: Federal, State, Local not specified - PPO | Admitting: *Deleted

## 2013-06-20 DIAGNOSIS — Q2381 Bicuspid aortic valve: Secondary | ICD-10-CM

## 2013-06-20 DIAGNOSIS — Z954 Presence of other heart-valve replacement: Secondary | ICD-10-CM

## 2013-06-20 DIAGNOSIS — Q231 Congenital insufficiency of aortic valve: Secondary | ICD-10-CM

## 2013-06-20 LAB — POCT INR: INR: 1.9

## 2013-07-11 ENCOUNTER — Ambulatory Visit: Payer: Federal, State, Local not specified - PPO | Admitting: Cardiovascular Disease

## 2013-07-12 ENCOUNTER — Ambulatory Visit: Payer: Federal, State, Local not specified - PPO | Admitting: Cardiovascular Disease

## 2013-07-16 ENCOUNTER — Other Ambulatory Visit: Payer: Self-pay | Admitting: Cardiovascular Disease

## 2013-07-18 ENCOUNTER — Ambulatory Visit (INDEPENDENT_AMBULATORY_CARE_PROVIDER_SITE_OTHER): Payer: Federal, State, Local not specified - PPO | Admitting: *Deleted

## 2013-07-18 ENCOUNTER — Ambulatory Visit (INDEPENDENT_AMBULATORY_CARE_PROVIDER_SITE_OTHER): Payer: Federal, State, Local not specified - PPO | Admitting: Cardiovascular Disease

## 2013-07-18 ENCOUNTER — Encounter: Payer: Self-pay | Admitting: Cardiovascular Disease

## 2013-07-18 VITALS — BP 120/61 | HR 61 | Ht 65.0 in | Wt 169.8 lb

## 2013-07-18 DIAGNOSIS — Z954 Presence of other heart-valve replacement: Secondary | ICD-10-CM

## 2013-07-18 DIAGNOSIS — I10 Essential (primary) hypertension: Secondary | ICD-10-CM

## 2013-07-18 DIAGNOSIS — Q254 Other congenital malformations of aorta: Secondary | ICD-10-CM

## 2013-07-18 DIAGNOSIS — Q231 Congenital insufficiency of aortic valve: Secondary | ICD-10-CM

## 2013-07-18 DIAGNOSIS — E785 Hyperlipidemia, unspecified: Secondary | ICD-10-CM

## 2013-07-18 LAB — POCT INR: INR: 2.4

## 2013-07-18 NOTE — Progress Notes (Signed)
    HPI:  58 year old woman presenting for followup evaluation. The patient has a history of bicuspid aortic valve stenosis status post mechanical aortic valve replacement in 1999. She's also been followed for a dilated ascending aorta with maximal dimension about 4.8 cm. Serial imaging studies have been followed and her most recent MRA was in April 2014. She presents today for cardiac followup evaluation. The patient's lipids from 2014 showed cholesterol 120, HDL 41, and LDL 69.  Overall the patient is doing well. She reports no change in her symptoms since I saw her last. She has mild exercise intolerance. She's had no chest pain or pressure, orthopnea, PND, leg swelling, palpitations, lightheadedness, or syncope.  She continues to have some concerns about prediabetes as well as her risk of developing Alzheimer's disease since there is a family history of this. She is working on dietary modifications with a focus on low car. Outpatient Encounter Prescriptions as of 07/18/2013  Medication Sig  . aspirin 81 MG EC tablet Take 81 mg by mouth daily.    . carvedilol (COREG) 3.125 MG tablet TAKE 1 TABLET TWICE DAILY.  Marland Kitchen Cholecalciferol (VITAMIN D) 1000 UNITS capsule Take 1,000 Units by mouth daily.    Marland Kitchen co-enzyme Q-10 30 MG capsule Take 30 mg by mouth daily.   Marland Kitchen COUMADIN 7.5 MG tablet TAKE AS DIRECTED BY COUMADIN CLINIC.  Marland Kitchen metFORMIN (GLUCOPHAGE) 500 MG tablet Take 1 tablet (500 mg total) by mouth 2 (two) times daily with a meal.  . Milk Thistle-Turmeric (SILYMARIN) CAPS Take 1 capsule by mouth daily.   . simvastatin (ZOCOR) 40 MG tablet TAKE ONE TABLET AT BEDTIME.  Marland Kitchen warfarin (COUMADIN) 10 MG tablet Take as directed by Anticoagulation clinic    Allergies  Allergen Reactions  . Hydralazine Hcl Rash  . Penicillins Rash  . Sulfonamide Derivatives Rash  . Vancomycin Rash    Past Medical History  Diagnosis Date  . Diabetes mellitus without complication   . Hypertension   . Pinched nerve in neck      BP 120/61  Pulse 61  Ht 5\' 5"  (1.651 m)  Wt 169 lb 12.8 oz (77.021 kg)  BMI 28.26 kg/m2  PHYSICAL EXAM: Pt is alert and oriented, NAD HEENT: normal Neck: JVP - normal, carotids 2+= without bruits Lungs: CTA bilaterally CV: RRR with grade 2/6 systolic ejection murmur at the left sternal border with a normal mechanical A2 Abd: soft, NT, Positive BS, no hepatomegaly Ext: no C/C/E, distal pulses intact and equal Skin: warm/dry no rash  EKG:  Normal sinus rhythm 61 beats per minute, nonspecific T wave abnormality.  ASSESSMENT AND PLAN: 1. Aortic valve disease status post mechanical aortic valve replacement. The patient is tolerating the combination of low-dose aspirin and warfarin. Her exam is unchanged and suggestive of normal valve function. She will followup in 6 months.  2. Hyperlipidemia. Patient is on simvastatin. She's concerned about the dose and would like to consider dose reduction. Will await the results of her lipids and consider reduction from 40-20 mg.  3. Ascending aortic aneurysm. This measured 4.8 cm by MRA last year. She will have a 2 year followup since this is been stable over time. Followed by Dr. Cyndia Bent.  For followup I will see her in 6 months.  Sherren Mocha 07/18/2013 10:06 AM

## 2013-07-18 NOTE — Patient Instructions (Signed)
Your physician recommends that you return for lab work on Monday, March 16 - cholesterol/liver screening - do not eat or drink anything after midnight the night before your appointment  Your physician recommends that you continue on your current medications as directed. Please refer to the Current Medication list given to you today.  Your physician wants you to follow-up in: 6 months with Dr. Burt Knack.  You will receive a reminder letter in the mail two months in advance. If you don't receive a letter, please call our office to schedule the follow-up appointment.

## 2013-07-23 ENCOUNTER — Other Ambulatory Visit (INDEPENDENT_AMBULATORY_CARE_PROVIDER_SITE_OTHER): Payer: Federal, State, Local not specified - PPO

## 2013-07-23 DIAGNOSIS — E785 Hyperlipidemia, unspecified: Secondary | ICD-10-CM

## 2013-07-23 LAB — HEPATIC FUNCTION PANEL
ALT: 15 U/L (ref 0–35)
AST: 13 U/L (ref 0–37)
Albumin: 3.8 g/dL (ref 3.5–5.2)
Alkaline Phosphatase: 55 U/L (ref 39–117)
BILIRUBIN DIRECT: 0.1 mg/dL (ref 0.0–0.3)
Total Bilirubin: 1 mg/dL (ref 0.3–1.2)
Total Protein: 6.4 g/dL (ref 6.0–8.3)

## 2013-07-23 LAB — LIPID PANEL
CHOLESTEROL: 124 mg/dL (ref 0–200)
HDL: 45.2 mg/dL (ref >39.00)
LDL Cholesterol: 67 mg/dL (ref 0–99)
Total CHOL/HDL Ratio: 3
Triglycerides: 61 mg/dL (ref 0.0–149.0)
VLDL: 12.2 mg/dL (ref 0.0–40.0)

## 2013-08-15 ENCOUNTER — Other Ambulatory Visit: Payer: Self-pay | Admitting: Cardiovascular Disease

## 2013-08-22 ENCOUNTER — Ambulatory Visit (INDEPENDENT_AMBULATORY_CARE_PROVIDER_SITE_OTHER): Payer: Federal, State, Local not specified - PPO | Admitting: *Deleted

## 2013-08-22 DIAGNOSIS — Z954 Presence of other heart-valve replacement: Secondary | ICD-10-CM

## 2013-08-22 DIAGNOSIS — Q231 Congenital insufficiency of aortic valve: Secondary | ICD-10-CM

## 2013-08-22 LAB — POCT INR: INR: 2.2

## 2013-08-27 ENCOUNTER — Other Ambulatory Visit (HOSPITAL_COMMUNITY): Payer: Self-pay | Admitting: Internal Medicine

## 2013-09-16 ENCOUNTER — Other Ambulatory Visit: Payer: Self-pay | Admitting: Cardiovascular Disease

## 2013-09-19 ENCOUNTER — Ambulatory Visit (INDEPENDENT_AMBULATORY_CARE_PROVIDER_SITE_OTHER): Payer: Federal, State, Local not specified - PPO | Admitting: *Deleted

## 2013-09-19 DIAGNOSIS — Q231 Congenital insufficiency of aortic valve: Secondary | ICD-10-CM

## 2013-09-19 DIAGNOSIS — Z954 Presence of other heart-valve replacement: Secondary | ICD-10-CM

## 2013-09-19 LAB — POCT INR: INR: 1.8

## 2013-10-03 ENCOUNTER — Ambulatory Visit (INDEPENDENT_AMBULATORY_CARE_PROVIDER_SITE_OTHER): Payer: Federal, State, Local not specified - PPO | Admitting: *Deleted

## 2013-10-03 DIAGNOSIS — Z954 Presence of other heart-valve replacement: Secondary | ICD-10-CM

## 2013-10-03 DIAGNOSIS — Q231 Congenital insufficiency of aortic valve: Secondary | ICD-10-CM

## 2013-10-03 LAB — POCT INR: INR: 2.2

## 2013-10-24 ENCOUNTER — Other Ambulatory Visit (HOSPITAL_COMMUNITY): Payer: Self-pay | Admitting: Obstetrics and Gynecology

## 2013-10-24 DIAGNOSIS — Z1231 Encounter for screening mammogram for malignant neoplasm of breast: Secondary | ICD-10-CM

## 2013-10-26 ENCOUNTER — Ambulatory Visit (HOSPITAL_COMMUNITY)
Admission: RE | Admit: 2013-10-26 | Discharge: 2013-10-26 | Disposition: A | Discharge status: Home / Self Care | Payer: Federal, State, Local not specified - PPO | Source: Ambulatory Visit | Attending: Obstetrics and Gynecology | Admitting: Obstetrics and Gynecology

## 2013-10-26 DIAGNOSIS — Z1231 Encounter for screening mammogram for malignant neoplasm of breast: Secondary | ICD-10-CM

## 2013-10-31 ENCOUNTER — Ambulatory Visit (INDEPENDENT_AMBULATORY_CARE_PROVIDER_SITE_OTHER): Payer: Federal, State, Local not specified - PPO | Admitting: *Deleted

## 2013-10-31 DIAGNOSIS — Z954 Presence of other heart-valve replacement: Secondary | ICD-10-CM

## 2013-10-31 DIAGNOSIS — Q231 Congenital insufficiency of aortic valve: Secondary | ICD-10-CM

## 2013-10-31 LAB — POCT INR: INR: 2.5

## 2013-11-28 ENCOUNTER — Ambulatory Visit (INDEPENDENT_AMBULATORY_CARE_PROVIDER_SITE_OTHER): Payer: Federal, State, Local not specified - PPO | Admitting: *Deleted

## 2013-11-28 DIAGNOSIS — Q231 Congenital insufficiency of aortic valve: Secondary | ICD-10-CM

## 2013-11-28 DIAGNOSIS — Z954 Presence of other heart-valve replacement: Secondary | ICD-10-CM

## 2013-11-28 LAB — POCT INR: INR: 2.6

## 2013-12-02 ENCOUNTER — Other Ambulatory Visit (HOSPITAL_COMMUNITY): Payer: Self-pay | Admitting: Internal Medicine

## 2013-12-09 ENCOUNTER — Other Ambulatory Visit: Payer: Self-pay | Admitting: Cardiovascular Disease

## 2013-12-09 ENCOUNTER — Other Ambulatory Visit: Payer: Self-pay | Admitting: Family Medicine

## 2013-12-09 ENCOUNTER — Other Ambulatory Visit (HOSPITAL_COMMUNITY): Payer: Self-pay | Admitting: Internal Medicine

## 2013-12-26 ENCOUNTER — Ambulatory Visit (INDEPENDENT_AMBULATORY_CARE_PROVIDER_SITE_OTHER): Payer: Federal, State, Local not specified - PPO | Admitting: Surgery

## 2013-12-26 DIAGNOSIS — Q231 Congenital insufficiency of aortic valve: Secondary | ICD-10-CM

## 2013-12-26 DIAGNOSIS — Z954 Presence of other heart-valve replacement: Secondary | ICD-10-CM

## 2013-12-26 LAB — POCT INR: INR: 2.4

## 2014-01-15 ENCOUNTER — Other Ambulatory Visit: Payer: Self-pay | Admitting: Cardiovascular Disease

## 2014-01-23 ENCOUNTER — Ambulatory Visit (INDEPENDENT_AMBULATORY_CARE_PROVIDER_SITE_OTHER): Payer: Federal, State, Local not specified - PPO | Admitting: Cardiovascular Disease

## 2014-01-23 ENCOUNTER — Encounter: Payer: Self-pay | Admitting: Cardiovascular Disease

## 2014-01-23 ENCOUNTER — Ambulatory Visit (INDEPENDENT_AMBULATORY_CARE_PROVIDER_SITE_OTHER): Payer: Federal, State, Local not specified - PPO | Admitting: *Deleted

## 2014-01-23 VITALS — BP 134/78 | HR 59 | Ht 65.0 in | Wt 171.0 lb

## 2014-01-23 DIAGNOSIS — E785 Hyperlipidemia, unspecified: Secondary | ICD-10-CM

## 2014-01-23 DIAGNOSIS — Q231 Congenital insufficiency of aortic valve: Secondary | ICD-10-CM

## 2014-01-23 DIAGNOSIS — I10 Essential (primary) hypertension: Secondary | ICD-10-CM

## 2014-01-23 DIAGNOSIS — Q254 Other congenital malformations of aorta: Secondary | ICD-10-CM

## 2014-01-23 DIAGNOSIS — Z954 Presence of other heart-valve replacement: Secondary | ICD-10-CM

## 2014-01-23 DIAGNOSIS — I712 Thoracic aortic aneurysm, without rupture, unspecified: Secondary | ICD-10-CM

## 2014-01-23 LAB — POCT INR: INR: 2.7

## 2014-01-23 MED ORDER — SIMVASTATIN 20 MG PO TABS: ORAL_TABLET | ORAL | Status: DC

## 2014-01-23 NOTE — Patient Instructions (Addendum)
Your physician has recommended a CHEST MRA with Contrast in April 2016.  Your physician recommends that you return for a FASTING LIPID, LIVER and BMP in April (prior to MRA)--nothing to eat or drink after midnight, lab opens at 7:30 AM  Your physician has recommended you make the following change in your medication:  DECREASE Simvastatin to 20mg  take one by mouth every evening (You can cut the 40mg  tablet in half)  Your physician wants you to follow-up in: April 2016 with Dr Burt Knack. You will receive a reminder letter in the mail two months in advance. If you don't receive a letter, please call our office to schedule the follow-up appointment.

## 2014-01-23 NOTE — Progress Notes (Signed)
HPI:  58 year old woman presenting for followup evaluation. The patient has a history of bicuspid aortic valve stenosis status post mechanical aortic valve replacement in 1999. She's also been followed for a dilated ascending aorta with maximal dimension about 4.8 cm. Serial imaging studies have been followed and her most recent MRA was in April 2014. She presents today for cardiac followup evaluation.  The patient is doing well. She reports no cardiac symptoms. She specifically denies chest pain or shortness of breath. She exercises without exertional symptoms, but does describe mild exercise intolerance which has been long-standing. She reports no bleeding problems on chronic warfarin.  Outpatient Encounter Prescriptions as of 01/23/2014  Medication Sig  . aspirin 81 MG EC tablet Take 81 mg by mouth daily.    . carvedilol (COREG) 3.125 MG tablet TAKE 1 TABLET TWICE DAILY.  Marland Kitchen Cholecalciferol (VITAMIN D) 1000 UNITS capsule Take 1,000 Units by mouth daily.    Marland Kitchen co-enzyme Q-10 30 MG capsule Take 30 mg by mouth daily.   Marland Kitchen COUMADIN 10 MG tablet TAKE AS DIRECTED BY COUMADIN CLINIC.  Marland Kitchen COUMADIN 7.5 MG tablet TAKE AS DIRECTED BY COUMADIN CLINIC.  Marland Kitchen metFORMIN (GLUCOPHAGE) 500 MG tablet TAKE 1 TABLET TWICE DAILY WITH A MEAL.  . Milk Thistle-Turmeric (SILYMARIN) CAPS Take 1 capsule by mouth daily.   . simvastatin (ZOCOR) 40 MG tablet TAKE ONE TABLET AT BEDTIME.    Allergies  Allergen Reactions  . Hydralazine Hcl Rash  . Penicillins Rash  . Sulfonamide Derivatives Rash  . Vancomycin Rash    Past Medical History  Diagnosis Date  . Diabetes mellitus without complication   . Hypertension   . Pinched nerve in neck     ROS: Negative except as per HPI  BP 134/78  Pulse 59  Ht 5\' 5"  (1.651 m)  Wt 171 lb (77.565 kg)  BMI 28.46 kg/m2  PHYSICAL EXAM: Pt is alert and oriented, NAD HEENT: normal Neck: JVP - normal, carotids 2+= without bruits Lungs: CTA bilaterally CV: RRR with grade 2/6  ejection murmur at the right upper sternal border and normal mechanical A2 Abd: soft, NT, Positive BS, no hepatomegaly Ext: no C/C/E, distal pulses intact and equal Skin: warm/dry no rash  EKG:  Sinus rhythm 59 beats per minute, nonspecific T wave abnormality.  MRA chest April 2014: Findings: Stable aneurysmal dilatation of the ascending thoracic  aorta with a maximal transverse diameter of 4.7 cm which is not  insignificantly changed compared to 4.8 cm on the prior study.  Stable dilatation of the aortic root at 4.2 cm. The proximal  transverse aorta measures 3.3 cm in diameter, the distal transverse  aorta measures 2.1 cm in diameter. The proximal descending  thoracic aorta measures 2.4 cm in diameter while the distal  descending aorta measures 1.9 cm above the aortic hiatus.  Conventional three-vessel arch anatomy. Small ductus diverticula  again incidentally noted. No evidence of dissection. Heart is  within normal limits for size. No pericardial effusion. Metallic  artifact consistent with prior median sternotomy and aortic valve  replacement.  No incidental abnormalities detected on the contrast enhanced  images. The visualized abdominal aorta is unremarkable. Osseous  structures are unremarkable.  IMPRESSION:  Continued stability of 4.7 - 4.8 cm ascending thoracic aortic  aneurysm.    ASSESSMENT AND PLAN: 1. Aortic valve disease status post mechanical aortic valve replacement. The patient is tolerating the combination of low-dose aspirin and warfarin. Her exam is unchanged and suggestive of normal valve function. She  will followup in 6 months.   2. Hyperlipidemia. Patient is on simvastatin. Most recent lipids reviewed demonstrating and LDL cholesterol of 67. She has a strong desire to be on less statin medicine. I'm going to reduce her simvastatin to 20 mg daily. Will repeat lipids in 6 months.  3. Ascending aortic aneurysm. Stable by most recent MRA. Recommend repeat study  which will be a 2 year interval from her previous. This will be scheduled in April I will see her back in followup after it is completed.   Sherren Mocha 01/23/2014 2:49 PM

## 2014-02-05 ENCOUNTER — Ambulatory Visit (HOSPITAL_COMMUNITY): Payer: Federal, State, Local not specified - PPO

## 2014-02-10 ENCOUNTER — Other Ambulatory Visit: Payer: Self-pay | Admitting: Cardiovascular Disease

## 2014-02-22 NOTE — Telephone Encounter (Signed)
Nothing was typed/tmj 

## 2014-02-27 ENCOUNTER — Ambulatory Visit (INDEPENDENT_AMBULATORY_CARE_PROVIDER_SITE_OTHER): Payer: Federal, State, Local not specified - PPO | Admitting: Pharmacist

## 2014-02-27 DIAGNOSIS — Z952 Presence of prosthetic heart valve: Secondary | ICD-10-CM

## 2014-02-27 DIAGNOSIS — Z954 Presence of other heart-valve replacement: Secondary | ICD-10-CM

## 2014-02-27 DIAGNOSIS — Q231 Congenital insufficiency of aortic valve: Secondary | ICD-10-CM

## 2014-02-27 LAB — POCT INR: INR: 2.1

## 2014-04-10 ENCOUNTER — Ambulatory Visit (INDEPENDENT_AMBULATORY_CARE_PROVIDER_SITE_OTHER): Payer: Federal, State, Local not specified - PPO | Admitting: *Deleted

## 2014-04-10 DIAGNOSIS — Q231 Congenital insufficiency of aortic valve: Secondary | ICD-10-CM

## 2014-04-10 DIAGNOSIS — Z954 Presence of other heart-valve replacement: Secondary | ICD-10-CM

## 2014-04-10 DIAGNOSIS — Z952 Presence of prosthetic heart valve: Secondary | ICD-10-CM

## 2014-04-10 LAB — POCT INR: INR: 1.9

## 2014-05-07 ENCOUNTER — Other Ambulatory Visit (HOSPITAL_COMMUNITY): Payer: Self-pay | Admitting: Internal Medicine

## 2014-05-07 ENCOUNTER — Ambulatory Visit (INDEPENDENT_AMBULATORY_CARE_PROVIDER_SITE_OTHER): Payer: Federal, State, Local not specified - PPO | Admitting: Pharmacist Clinician (PhC)/ Clinical Pharmacy Specialist

## 2014-05-07 DIAGNOSIS — Q231 Congenital insufficiency of aortic valve: Secondary | ICD-10-CM

## 2014-05-07 DIAGNOSIS — Z952 Presence of prosthetic heart valve: Secondary | ICD-10-CM

## 2014-05-07 DIAGNOSIS — Z954 Presence of other heart-valve replacement: Secondary | ICD-10-CM

## 2014-05-07 LAB — POCT INR: INR: 2.5

## 2014-05-07 MED ORDER — WARFARIN SODIUM 10 MG PO TABS: ORAL_TABLET | ORAL | Status: DC

## 2014-05-08 ENCOUNTER — Telehealth: Payer: Self-pay

## 2014-05-08 ENCOUNTER — Other Ambulatory Visit: Payer: Self-pay | Admitting: Surgery

## 2014-05-08 ENCOUNTER — Other Ambulatory Visit: Payer: Self-pay | Admitting: *Deleted

## 2014-05-08 MED ORDER — WARFARIN SODIUM 10 MG PO TABS: 10.0000 mg | ORAL_TABLET | ORAL | Status: DC

## 2014-05-08 NOTE — Telephone Encounter (Signed)
Error

## 2014-05-16 NOTE — Telephone Encounter (Signed)
refill 

## 2014-05-20 ENCOUNTER — Telehealth: Payer: Self-pay

## 2014-05-20 MED ORDER — COUMADIN 7.5 MG PO TABS: ORAL_TABLET | ORAL | Status: DC

## 2014-05-20 MED ORDER — COUMADIN 10 MG PO TABS: ORAL_TABLET | ORAL | Status: DC

## 2014-05-20 NOTE — Telephone Encounter (Signed)
Pt called clinic, states she recently switched from brand name Coumadin to generic Warfarin and her hair has begun falling out by the handfulls.  Pt wishes to switch back to name brand Coumadin and would like a new rx for band name Coumadin sent to [facility].

## 2014-06-05 ENCOUNTER — Ambulatory Visit (INDEPENDENT_AMBULATORY_CARE_PROVIDER_SITE_OTHER): Payer: Federal, State, Local not specified - PPO | Admitting: *Deleted

## 2014-06-05 DIAGNOSIS — Z952 Presence of prosthetic heart valve: Secondary | ICD-10-CM

## 2014-06-05 DIAGNOSIS — Q231 Congenital insufficiency of aortic valve: Secondary | ICD-10-CM

## 2014-06-05 DIAGNOSIS — Z954 Presence of other heart-valve replacement: Secondary | ICD-10-CM

## 2014-06-05 LAB — POCT INR: INR: 1.9

## 2014-06-26 ENCOUNTER — Ambulatory Visit (INDEPENDENT_AMBULATORY_CARE_PROVIDER_SITE_OTHER): Payer: Federal, State, Local not specified - PPO | Admitting: *Deleted

## 2014-06-26 DIAGNOSIS — Q231 Congenital insufficiency of aortic valve: Secondary | ICD-10-CM

## 2014-06-26 DIAGNOSIS — Z954 Presence of other heart-valve replacement: Secondary | ICD-10-CM

## 2014-06-26 DIAGNOSIS — Z952 Presence of prosthetic heart valve: Secondary | ICD-10-CM

## 2014-06-26 LAB — POCT INR: INR: 1.8

## 2014-07-03 ENCOUNTER — Other Ambulatory Visit: Payer: Self-pay | Admitting: Family Medicine

## 2014-07-04 ENCOUNTER — Telehealth: Payer: Self-pay | Admitting: Cardiovascular Disease

## 2014-07-04 NOTE — Telephone Encounter (Signed)
New message     Is it ok for pt to take monixidil (rogaine)?

## 2014-07-04 NOTE — Telephone Encounter (Signed)
Spoke with Monica Key and informed her that Dr. Burt Knack said he thinks it will be fine for her to start the Monixidil. Monica Key verbalized understanding and was in agreement with this.

## 2014-07-04 NOTE — Telephone Encounter (Signed)
Spoke with pt and she states that her Dermatologist suggested that she begin using Rogaine. Pt states that some staff were saying that although the Monixidil is an antihypertensive that none of them had ever had problems with using it. Pt states that she was reading the box and decided that she wanted to get approval from Dr. Burt Knack prior to using the Rogaine. Informed pt that I would forward this information to Dr. Burt Knack for review and advisement. Pt verbalized understanding and was in agreement with this plan.

## 2014-07-04 NOTE — Telephone Encounter (Signed)
I think this is fine. thx

## 2014-07-07 ENCOUNTER — Other Ambulatory Visit: Payer: Self-pay | Admitting: Cardiovascular Disease

## 2014-07-10 ENCOUNTER — Ambulatory Visit (INDEPENDENT_AMBULATORY_CARE_PROVIDER_SITE_OTHER): Payer: Federal, State, Local not specified - PPO | Admitting: Pharmacist

## 2014-07-10 DIAGNOSIS — Z954 Presence of other heart-valve replacement: Secondary | ICD-10-CM

## 2014-07-10 DIAGNOSIS — Q231 Congenital insufficiency of aortic valve: Secondary | ICD-10-CM

## 2014-07-10 DIAGNOSIS — Z952 Presence of prosthetic heart valve: Secondary | ICD-10-CM

## 2014-07-10 LAB — POCT INR: INR: 2.4

## 2014-08-09 ENCOUNTER — Ambulatory Visit (INDEPENDENT_AMBULATORY_CARE_PROVIDER_SITE_OTHER): Payer: Federal, State, Local not specified - PPO

## 2014-08-09 DIAGNOSIS — Z954 Presence of other heart-valve replacement: Secondary | ICD-10-CM

## 2014-08-09 DIAGNOSIS — Z952 Presence of prosthetic heart valve: Secondary | ICD-10-CM

## 2014-08-09 DIAGNOSIS — Q231 Congenital insufficiency of aortic valve: Secondary | ICD-10-CM | POA: Diagnosis not present

## 2014-08-09 LAB — POCT INR: INR: 2.1

## 2014-08-12 ENCOUNTER — Other Ambulatory Visit (INDEPENDENT_AMBULATORY_CARE_PROVIDER_SITE_OTHER): Payer: Federal, State, Local not specified - PPO | Admitting: *Deleted

## 2014-08-12 DIAGNOSIS — I10 Essential (primary) hypertension: Secondary | ICD-10-CM

## 2014-08-12 DIAGNOSIS — E785 Hyperlipidemia, unspecified: Secondary | ICD-10-CM

## 2014-08-12 DIAGNOSIS — I712 Thoracic aortic aneurysm, without rupture, unspecified: Secondary | ICD-10-CM

## 2014-08-12 LAB — LIPID PANEL
CHOLESTEROL: 130 mg/dL (ref 0–200)
HDL: 48.2 mg/dL (ref >39.00)
LDL CALC: 66 mg/dL (ref 0–99)
NonHDL: 81.8
TRIGLYCERIDES: 78 mg/dL (ref 0.0–149.0)
Total CHOL/HDL Ratio: 3
VLDL: 15.6 mg/dL (ref 0.0–40.0)

## 2014-08-12 LAB — HEPATIC FUNCTION PANEL
ALBUMIN: 4 g/dL (ref 3.5–5.2)
ALT: 15 U/L (ref 0–35)
AST: 17 U/L (ref 0–37)
Alkaline Phosphatase: 59 U/L (ref 39–117)
Bilirubin, Direct: 0.3 mg/dL (ref 0.0–0.3)
Total Bilirubin: 1.6 mg/dL — ABNORMAL HIGH (ref 0.2–1.2)
Total Protein: 6.6 g/dL (ref 6.0–8.3)

## 2014-08-12 LAB — BASIC METABOLIC PANEL
BUN: 14 mg/dL (ref 6–23)
CALCIUM: 9.3 mg/dL (ref 8.4–10.5)
CHLORIDE: 106 meq/L (ref 96–112)
CO2: 30 mEq/L (ref 19–32)
CREATININE: 0.6 mg/dL (ref 0.40–1.20)
GFR: 108.08 mL/min (ref >60.00)
Glucose, Bld: 95 mg/dL (ref 70–99)
Potassium: 4.1 mEq/L (ref 3.5–5.1)
Sodium: 140 mEq/L (ref 135–145)

## 2014-08-19 ENCOUNTER — Ambulatory Visit
Admission: RE | Admit: 2014-08-19 | Discharge: 2014-08-19 | Disposition: A | Discharge status: Home / Self Care | Payer: Federal, State, Local not specified - PPO | Source: Ambulatory Visit | Attending: Cardiovascular Disease | Admitting: Cardiovascular Disease

## 2014-08-19 DIAGNOSIS — Q231 Congenital insufficiency of aortic valve: Secondary | ICD-10-CM

## 2014-08-19 DIAGNOSIS — I712 Thoracic aortic aneurysm, without rupture, unspecified: Secondary | ICD-10-CM

## 2014-08-19 MED ORDER — GADOBENATE DIMEGLUMINE 529 MG/ML IV SOLN
15.0000 mL | Freq: Once | INTRAVENOUS | Status: AC | PRN
  Administered 2014-08-19: 15 mL via INTRAVENOUS

## 2014-08-29 ENCOUNTER — Other Ambulatory Visit: Payer: Self-pay | Admitting: *Deleted

## 2014-08-29 DIAGNOSIS — I712 Thoracic aortic aneurysm, without rupture, unspecified: Secondary | ICD-10-CM

## 2014-09-09 ENCOUNTER — Other Ambulatory Visit: Payer: Federal, State, Local not specified - PPO

## 2014-09-17 ENCOUNTER — Ambulatory Visit (INDEPENDENT_AMBULATORY_CARE_PROVIDER_SITE_OTHER): Payer: Federal, State, Local not specified - PPO | Admitting: Cardiovascular Disease

## 2014-09-17 ENCOUNTER — Ambulatory Visit (INDEPENDENT_AMBULATORY_CARE_PROVIDER_SITE_OTHER): Payer: Federal, State, Local not specified - PPO | Admitting: *Deleted

## 2014-09-17 ENCOUNTER — Encounter: Payer: Self-pay | Admitting: Cardiovascular Disease

## 2014-09-17 VITALS — BP 122/76 | HR 60 | Ht 65.0 in | Wt 149.4 lb

## 2014-09-17 DIAGNOSIS — Q231 Congenital insufficiency of aortic valve: Secondary | ICD-10-CM

## 2014-09-17 DIAGNOSIS — Z952 Presence of prosthetic heart valve: Secondary | ICD-10-CM

## 2014-09-17 DIAGNOSIS — E785 Hyperlipidemia, unspecified: Secondary | ICD-10-CM

## 2014-09-17 DIAGNOSIS — R079 Chest pain, unspecified: Secondary | ICD-10-CM | POA: Diagnosis not present

## 2014-09-17 DIAGNOSIS — I10 Essential (primary) hypertension: Secondary | ICD-10-CM | POA: Diagnosis not present

## 2014-09-17 DIAGNOSIS — Z954 Presence of other heart-valve replacement: Secondary | ICD-10-CM | POA: Diagnosis not present

## 2014-09-17 LAB — POCT INR: INR: 2.8

## 2014-09-17 MED ORDER — FLUVASTATIN SODIUM 40 MG PO CAPS: 40.0000 mg | ORAL_CAPSULE | Freq: Every day | ORAL | Status: DC

## 2014-09-17 NOTE — Patient Instructions (Signed)
Medication Instructions:  Your physician has recommended you make the following change in your medication:  1. STOP Simvastatin 2. START Fluvastatin 40mg  take one by mouth every evening  Labwork: Your physician recommends that you return for a FASTING LIPID, LIVER and GGT in 3 MONTHS--nothing to eat or drink after midnight, lab opens at 7:30 AM  Testing/Procedures: Your physician has requested that you have an exercise stress myoview. For further information please visit [email or web address]. Please follow instruction sheet, as given.  Follow-Up: Your physician wants you to follow-up in: 1 YEAR with Dr Burt Knack.  You will receive a reminder letter in the mail two months in advance. If you don't receive a letter, please call our office to schedule the follow-up appointment.   Any Other Special Instructions Will Be Listed Below (If Applicable).

## 2014-09-17 NOTE — Progress Notes (Signed)
Cardiology Office Note   Date:  09/17/2014   ID:  Monica KUYPER, DOB 06-27-1955, MRN [id number]  PCP:  Henrine Screws, MD  Cardiologist:  Sherren Mocha, MD    Chief Complaint  Patient presents with  . Thoracic Aortic Aneurysm    History of Present Illness: Monica Key is a 59 y.o. female who presents for followup evaluation. The patient has a history of bicuspid aortic valve stenosis status post mechanical aortic valve replacement in 1999. She's also been followed for a dilated ascending aorta with maximal dimension about 4.8 cm. Serial imaging studies have been followed and her most recent MRA was in April 2016. She presents today for cardiac followup evaluation.  The patient denies exertional symptoms. However, she does have frequent palpitations and chest pain at nighttime. She states sometimes "I feel like I'm dying." she has several concerns today. She has worried about her risk of developing diabetes. She would like to change from simvastatin to fluvastatin that she has read this is less diabetogenic. She has mild exercise intolerance. Denies dyspnea, orthopnea, or PND. She is compliant with her medical program. She recently had an MRA to follow-up for dilated ascending aorta.  Past Medical History  Diagnosis Date  . Diabetes mellitus without complication   . Hypertension   . Pinched nerve in neck     Past Surgical History  Procedure Laterality Date  . Cardiac surgery      Current Outpatient Prescriptions  Medication Sig Dispense Refill  . aspirin 81 MG EC tablet Take 81 mg by mouth daily.      . carvedilol (COREG) 3.125 MG tablet TAKE 1 TABLET TWICE DAILY. 60 tablet 3  . Cholecalciferol (VITAMIN D) 1000 UNITS capsule Take 1,000 Units by mouth daily.      Marland Kitchen co-enzyme Q-10 30 MG capsule Take 30 mg by mouth daily.     Marland Kitchen COUMADIN 10 MG tablet Take as directed by Coumadin Clinic 30 tablet 3  . COUMADIN 7.5 MG tablet Take as directed by Coumadin clinic 30 tablet 3  .  fluvastatin (LESCOL) 40 MG capsule Take 1 capsule (40 mg total) by mouth at bedtime. 30 capsule 11  . metFORMIN (GLUCOPHAGE) 500 MG tablet TAKE 1 TABLET TWICE DAILY WITH A MEAL. 60 tablet 3  . Milk Thistle-Turmeric (SILYMARIN) CAPS Take 1 capsule by mouth daily.      No current facility-administered medications for this visit.    Allergies:   Hydralazine hcl; Penicillins; Sulfonamide derivatives; and Vancomycin   Social History:  The patient  reports that she has never smoked. She does not have any smokeless tobacco history on file. She reports that she does not drink alcohol or use illicit drugs.   Family History:  The patient's family history includes Alzheimer's disease in her mother; Angina in her mother; Transient ischemic attack in her father.    ROS:  Please see the history of present illness.  Otherwise, review of systems is positive for  Chest pain, heart palpitations.  All other systems are reviewed and negative.    PHYSICAL EXAM: VS:  BP 122/76 mmHg  Pulse 60  Ht 5\' 5"  (1.651 m)  Wt 149 lb 6.4 oz (67.767 kg)  BMI 24.86 kg/m2 , BMI Body mass index is 24.86 kg/(m^2). GEN: Well nourished, well developed, in no acute distress HEENT: normal Neck: no JVD, no masses. No carotid bruits Cardiac: RRR with  Grade 2/6 systolic ejection murmur at the right upper sternal border with a normal mechanical  A2             Respiratory:  clear to auscultation bilaterally, normal work of breathing GI: soft, nontender, nondistended, + BS MS: no deformity or atrophy Ext: no pretibial edema, pedal pulses 2+= bilaterally Skin: warm and dry, no rash Neuro:  Strength and sensation are intact Psych: euthymic mood, full affect  EKG:  EKG is ordered today. The ekg ordered today shows  Normal sinus rhythm 60 bpm , nonspecific ST and T wave abnormality.  Recent Labs: 08/12/2014: ALT 15; BUN 14; Creatinine 0.60; Potassium 4.1; Sodium 140   Lipid Panel     Component Value Date/Time   CHOL 130  08/12/2014 0738   CHOL 133 12/12/2012 0743   TRIG 78.0 08/12/2014 0738   TRIG 96 12/12/2012 0743   HDL 48.20 08/12/2014 0738   HDL 45 12/12/2012 0743   CHOLHDL 3 08/12/2014 0738   VLDL 15.6 08/12/2014 0738   LDLCALC 66 08/12/2014 0738   LDLCALC 69 12/12/2012 0743   LDLDIRECT 151.7 06/21/2006 0902      Wt Readings from Last 3 Encounters:  09/17/14 149 lb 6.4 oz (67.767 kg)  01/23/14 171 lb (77.565 kg)  07/18/13 169 lb 12.8 oz (77.021 kg)     Cardiac Studies Reviewed: MRA: IMPRESSION: 1. Unchanged fusiform aneurysmal dilatation of the ascending thoracic aorta measuring approximately approximately 46 mm in greatest diameter, stable since the 08/2010 examination. 2. Stable sequela of prior median sternotomy and aortic valve replacement.  ASSESSMENT AND PLAN: 1.   Bicuspid aortic valve disease status post mechanical aortic valve replacement. The patient is tolerating warfarin without bleeding problems. She also continues on low-dose aspirin. Exam is unchanged. I will see her back in one year for follow-up evaluation.  2. Hyperlipidemia. The patient has concerns about her risk of diabetes. She would like to discontinue simvastatin. She has read that fluvastatin is a safer alternative with respect to a lower incidence of diabetes. Will change her to fluvastatin 40 mg daily. Repeat lipids and LFTs in 3 months to include a GGT as she is concerned about "fatty liver."   3. Ascending aortic aneurysm: Stable by recent MRA. Continue serial imaging follow-up every 2 years.   4. Chest pain: mostly atypical symptoms , but the patient does have known vascular disease and a strong family history of CAD. I have recommended an exercise stress Myoview scan in light of her abnormal baseline  EKG tracing.   Current medicines are reviewed with the patient today.  The patient does not have concerns regarding medicines.  Labs/ tests ordered today include:   Orders Placed This Encounter  Procedures    . Lipid panel  . Hepatic function panel  . Gamma GT  . Myocardial Perfusion Imaging  . EKG 12-Lead    Disposition:   FU one year  Signed, Sherren Mocha, MD  09/17/2014 5:54 PM    [facility] [address], [place], [place]  [address] Phone: [phone]; Fax: [phone]

## 2014-09-19 ENCOUNTER — Telehealth (HOSPITAL_COMMUNITY): Payer: Self-pay

## 2014-09-19 NOTE — Telephone Encounter (Signed)
Encounter complete. 

## 2014-09-21 ENCOUNTER — Encounter (HOSPITAL_COMMUNITY): Payer: Self-pay | Admitting: *Deleted

## 2014-09-21 ENCOUNTER — Emergency Department (HOSPITAL_COMMUNITY)
Admission: EM | Admit: 2014-09-21 | Discharge: 2014-09-21 | Disposition: A | Discharge status: Home / Self Care | Payer: Federal, State, Local not specified - PPO | Attending: Emergency Medicine | Admitting: Emergency Medicine

## 2014-09-21 ENCOUNTER — Emergency Department (HOSPITAL_COMMUNITY): Payer: Federal, State, Local not specified - PPO

## 2014-09-21 DIAGNOSIS — Y92410 Unspecified street and highway as the place of occurrence of the external cause: Secondary | ICD-10-CM | POA: Insufficient documentation

## 2014-09-21 DIAGNOSIS — E119 Type 2 diabetes mellitus without complications: Secondary | ICD-10-CM | POA: Diagnosis not present

## 2014-09-21 DIAGNOSIS — Z7982 Long term (current) use of aspirin: Secondary | ICD-10-CM | POA: Diagnosis not present

## 2014-09-21 DIAGNOSIS — Z8669 Personal history of other diseases of the nervous system and sense organs: Secondary | ICD-10-CM | POA: Diagnosis not present

## 2014-09-21 DIAGNOSIS — S62112A Displaced fracture of triquetrum [cuneiform] bone, left wrist, initial encounter for closed fracture: Secondary | ICD-10-CM | POA: Insufficient documentation

## 2014-09-21 DIAGNOSIS — I10 Essential (primary) hypertension: Secondary | ICD-10-CM | POA: Diagnosis not present

## 2014-09-21 DIAGNOSIS — W010XXA Fall on same level from slipping, tripping and stumbling without subsequent striking against object, initial encounter: Secondary | ICD-10-CM | POA: Insufficient documentation

## 2014-09-21 DIAGNOSIS — Z880 Allergy status to penicillin: Secondary | ICD-10-CM | POA: Diagnosis not present

## 2014-09-21 DIAGNOSIS — S6992XA Unspecified injury of left wrist, hand and finger(s), initial encounter: Secondary | ICD-10-CM | POA: Diagnosis present

## 2014-09-21 DIAGNOSIS — Y998 Other external cause status: Secondary | ICD-10-CM | POA: Diagnosis not present

## 2014-09-21 DIAGNOSIS — Z79899 Other long term (current) drug therapy: Secondary | ICD-10-CM | POA: Diagnosis not present

## 2014-09-21 DIAGNOSIS — S60511A Abrasion of right hand, initial encounter: Secondary | ICD-10-CM | POA: Diagnosis not present

## 2014-09-21 DIAGNOSIS — Y9302 Activity, running: Secondary | ICD-10-CM | POA: Diagnosis not present

## 2014-09-21 MED ORDER — HYDROCODONE-ACETAMINOPHEN 5-325 MG PO TABS: 1.0000 | ORAL_TABLET | Freq: Four times a day (QID) | ORAL | Status: DC | PRN

## 2014-09-21 NOTE — ED Notes (Signed)
The pt is  C/o lt wrist pain since she fell while running earlier today.  She struck her wrist on a planter.  Sl swelling and pain.  Fall approx 1530

## 2014-09-21 NOTE — ED Notes (Signed)
Patient called unable to locate at this time. 

## 2014-09-21 NOTE — ED Notes (Signed)
Patient transported to X-ray 

## 2014-09-21 NOTE — ED Notes (Signed)
Placed Ice Pack on left wrist.

## 2014-09-21 NOTE — ED Provider Notes (Signed)
CSN: [id number]     Arrival date & time 09/21/14  1953 History  This chart was scribed for Monica Senior, PA-C working with Alfonzo Beers, MD by Randa Evens, ED Scribe. This patient was seen in room TR09C/TR09C and the patient's care was started at 8:41 PM.      Chief Complaint  Patient presents with  . Wrist Pain   The history is provided by the patient. No language interpreter was used.   HPI Comments: Monica Key is a 59 y.o. female who presents to the Emergency Department complaining of left wrist pain onset today. Pt states has associated swelling, small abrasion to the left middle finger and small abrasion to the palm of her right hand. Pt states that she tripped and fell running across the street onto her out stretched hands. Pt states that the pain is worse with movement. Pt denies numbness or tingling. Pt does report taking coumadin.   Past Medical History  Diagnosis Date  . Diabetes mellitus without complication   . Hypertension   . Pinched nerve in neck    Past Surgical History  Procedure Laterality Date  . Cardiac surgery     Family History  Problem Relation Age of Onset  . Angina Mother   . Alzheimer's disease Mother   . Transient ischemic attack Father    History  Substance Use Topics  . Smoking status: Never Smoker   . Smokeless tobacco: Not on file  . Alcohol Use: No   OB History    No data available     Review of Systems  Musculoskeletal: Positive for joint swelling and arthralgias.  Neurological: Negative for numbness.  All other systems reviewed and are negative.     Allergies  Hydralazine hcl; Penicillins; Sulfonamide derivatives; and Vancomycin  Home Medications   Prior to Admission medications   Medication Sig Start Date End Date Taking? Authorizing Provider  aspirin 81 MG EC tablet Take 81 mg by mouth daily.      Historical Provider, MD  carvedilol (COREG) 3.125 MG tablet TAKE 1 TABLET TWICE DAILY. 07/08/14   Sherren Mocha, MD   Cholecalciferol (VITAMIN D) 1000 UNITS capsule Take 1,000 Units by mouth daily.      Historical Provider, MD  co-enzyme Q-10 30 MG capsule Take 30 mg by mouth daily.     Historical Provider, MD  COUMADIN 10 MG tablet Take as directed by Coumadin Clinic 05/20/14   Jolaine Artist, MD  COUMADIN 7.5 MG tablet Take as directed by Coumadin clinic 05/20/14   Jolaine Artist, MD  fluvastatin (LESCOL) 40 MG capsule Take 1 capsule (40 mg total) by mouth at bedtime. 09/17/14   Sherren Mocha, MD  metFORMIN (GLUCOPHAGE) 500 MG tablet TAKE 1 TABLET TWICE DAILY WITH A MEAL.    Coral Spikes, DO  Milk Thistle-Turmeric (SILYMARIN) CAPS Take 1 capsule by mouth daily.     Historical Provider, MD   BP 115/67 mmHg  Pulse 75  Temp(Src) 98.6 F (37 C) (Oral)  Resp 14  Ht 5\' 5"  (1.651 m)  Wt 149 lb 8 oz (67.813 kg)  BMI 24.88 kg/m2  SpO2 97%   Physical Exam  Constitutional: She is oriented to person, place, and time. She appears well-developed and well-nourished. No distress.  HENT:  Head: Normocephalic and atraumatic.  Eyes: Conjunctivae and EOM are normal.  Neck: Neck supple. No tracheal deviation present.  Cardiovascular: Normal rate.   Pulmonary/Chest: Effort normal. No respiratory distress.  Musculoskeletal: Normal range of  motion.  No obvious swelling, bruising, deformity noted to the left wrist. Tender to palpation over dorsal aspect of the left wrist. Pain with range of motion of the wrist in any directions. Normal fingers with no tenderness to palpation. Full range of motion in all joints. Radial pulses intact bilaterally. Small abrasion to the palm of the right hand. Small superficial laceration to the palmar surface of the middle finger, proximal phalanx of left hand.  Neurological: She is alert and oriented to person, place, and time.  Skin: Skin is warm and dry.  Psychiatric: She has a normal mood and affect. Her behavior is normal.  Nursing note and vitals reviewed.   ED Course   Procedures (including critical care time) DIAGNOSTIC STUDIES: Oxygen Saturation is 97% on RA, normal by my interpretation.    COORDINATION OF CARE: 9:15 PM-Discussed treatment plan with pt at bedside and pt agreed to plan.     Labs Review Labs Reviewed - No data to display  Imaging Review Dg Wrist Complete Left  09/21/2014   CLINICAL DATA:  Wrist pain after a fall today. Pain started several hours after the fall.  EXAM: LEFT WRIST - COMPLETE 3+ VIEW  COMPARISON:  None.  FINDINGS: A tiny osseous structure is demonstrated in the dorsal left wrist with mild dorsal soft tissue swelling. This suggests a possible avulsion fracture, likely a triquetrum fragment. Carpus appears otherwise intact. No evidence of dislocation of the left wrist. No additional fractures are identified.  IMPRESSION: Tiny osseous structure on the dorsal aspect of the left wrist with mild soft tissue swelling suggesting possible triquetrum avulsion fracture.   Electronically Signed   By: Lucienne Capers M.D.   On: 09/21/2014 22:29     EKG Interpretation None      MDM   Final diagnoses:  Fracture of triquetrum of left wrist, closed, initial encounter  Abrasion of right hand, initial encounter   Patient with left wrist injury during a fall earlier today. She is having tenderness to palpation over dorsal wrist and carpal bones. She has pain with range of motion of the wrist. She is neurovascularly intact. We'll get x-rays.  Small avulsion fracture off of triquetrum bone. Volar splint ordered. Will have patient follow-up with orthopedics hand specialist.  Filed Vitals:   09/21/14 2016  BP: 115/67  Pulse: 75  Temp: 98.6 F (37 C)  TempSrc: Oral  Resp: 14  Height: 5\' 5"  (1.651 m)  Weight: 149 lb 8 oz (67.813 kg)  SpO2: 97%     I personally performed the services described in this documentation, which was scribed in my presence. The recorded information has been reviewed and is accurate.      Monica Senior, PA-C 09/21/14 2323  Alfonzo Beers, MD 09/21/14 [phone]

## 2014-09-21 NOTE — Discharge Instructions (Signed)
You have a fracture in one of the carpal bones of the hand called triquetrum. Take Norco as prescribed as needed for severe pain. Keep hand elevated, ice several times a day. Follow-up with orthopedic specialist. Call them next week for an appointment.  Scaphoid Fracture, Wrist A fracture is a break in the bone. The bone you have broken often does not show up as a fracture on x-ray until later on in the healing phase. This bone is called the scaphoid bone. With this bone, your caregiver will often cast or splint your wrist as though it is fractured, even if a fracture is not seen on the x-ray. This is often done with wrist injuries in which there is tenderness at the base of the thumb. An x-ray at 1-3 weeks after your injury may confirm this fracture. A cast or splint is used to protect and keep your injured bone in good position for healing. The cast or splint will be on generally for about 6 to 16 weeks, depending on your health, age, the fracture location and how quickly you heal. Another name for the scaphoid bone is the navicular bone. HOME CARE INSTRUCTIONS   To lessen the swelling and pain, keep the injured part elevated above your heart while sitting or lying down.  Apply ice to the injury for 15-20 minutes, 03-04 times per day while awake, for 2 days. Put the ice in a plastic bag and place a thin towel between the bag of ice and your cast.  If you have a plaster or fiberglass cast or splint:  Do not try to scratch the skin under the cast using sharp or pointed objects.  Check the skin around the cast every day. You may put lotion on any red or sore areas.  Keep your cast or splint dry and clean.  If you have a plaster splint:  Wear the splint as directed.  You may loosen the elastic bandage around the splint if your fingers become numb, tingle, or turn cold or blue.  If you have been put in a removable splint, wear and use as directed.  Do not put pressure on any part of your cast  or splint; it may deform or break. Rest your cast or splint only on a pillow the first 24 hours until it is fully hardened.  Your cast or splint can be protected during bathing with a plastic bag. Do not lower the cast or splint into water.  Only take over-the-counter or prescription medicines for pain, discomfort, or fever as directed by your caregiver.  If your caregiver has given you a follow up appointment, it is very important to keep that appointment. Not keeping the appointment could result in chronic pain and decreased function. If there is any problem keeping the appointment, you must call back to this facility for assistance. SEEK IMMEDIATE MEDICAL CARE IF:   Your cast gets damaged, wet or breaks.  You have continued severe pain or more swelling than you did before the cast or splint was put on.  Your skin or nails below the injury turn blue or gray, or feel cold or numb.  You have tingling or burning pain in your fingers or increasing pain with movement of your fingers Document Released: 04/16/2002 Document Revised: 07/19/2011 Document Reviewed: 12/13/2008 [facility] Patient Information 2015 [place], [place]. This information is not intended to replace advice given to you by your health care provider. Make sure you discuss any questions you have with your health care provider.

## 2014-09-21 NOTE — Progress Notes (Signed)
Orthopedic Tech Progress Note Patient Details:  Monica Key [birth year] [id number]  Ortho Devices Type of Ortho Device: Volar splint, Ace wrap Ortho Device/Splint Interventions: Application   Irish Elders 09/21/2014, 11:15 PM

## 2014-09-24 ENCOUNTER — Ambulatory Visit (HOSPITAL_COMMUNITY)
Admission: RE | Admit: 2014-09-24 | Discharge: 2014-09-24 | Disposition: A | Discharge status: Home / Self Care | Payer: Federal, State, Local not specified - PPO | Source: Ambulatory Visit | Attending: Cardiovascular Disease | Admitting: Cardiovascular Disease

## 2014-09-24 DIAGNOSIS — E785 Hyperlipidemia, unspecified: Secondary | ICD-10-CM | POA: Diagnosis not present

## 2014-09-24 DIAGNOSIS — Q231 Congenital insufficiency of aortic valve: Secondary | ICD-10-CM

## 2014-09-24 DIAGNOSIS — I10 Essential (primary) hypertension: Secondary | ICD-10-CM

## 2014-09-24 LAB — MYOCARDIAL PERFUSION IMAGING
CHL CUP RESTING HR STRESS: 59 {beats}/min
CHL CUP STRESS STAGE 1 GRADE: 0 %
CHL CUP STRESS STAGE 1 HR: 71 {beats}/min
CHL CUP STRESS STAGE 1 SPEED: 0 mph
CHL CUP STRESS STAGE 2 GRADE: 0 %
CHL CUP STRESS STAGE 3 SPEED: 1 mph
CHL CUP STRESS STAGE 4 GRADE: 10 %
CHL CUP STRESS STAGE 4 HR: 97 {beats}/min
CHL CUP STRESS STAGE 4 SPEED: 1.7 mph
CHL CUP STRESS STAGE 5 DBP: 60 mmHg
CHL CUP STRESS STAGE 5 GRADE: 12 %
CHL CUP STRESS STAGE 5 HR: 120 {beats}/min
CHL CUP STRESS STAGE 5 SBP: 133 mmHg
CHL CUP STRESS STAGE 5 SPEED: 2.5 mph
CHL CUP STRESS STAGE 7 GRADE: 16 %
CHL CUP STRESS STAGE 7 SPEED: 4.2 mph
CHL CUP STRESS STAGE 8 DBP: 66 mmHg
CHL CUP STRESS STAGE 8 GRADE: 0 %
CHL CUP STRESS STAGE 8 SBP: 137 mmHg
CHL CUP STRESS STAGE 9 HR: 73 {beats}/min
CSEPEDS: 22 s
CSEPHR: 92 %
CSEPPMHR: 91 %
Estimated workload: 10.7 METS
Exercise duration (min): 9 min
LV dias vol: 80 mL
LV sys vol: 34 mL
MPHR: 160 {beats}/min
NUC STRESS TID: 1.09
Nuc Stress EF: 57 %
Peak HR: 146 {beats}/min
RPE: 20002
SDS: 0
SRS: 1
SSS: 1
Stage 1 DBP: 76 mmHg
Stage 1 SBP: 105 mmHg
Stage 2 HR: 67 {beats}/min
Stage 2 Speed: 1 mph
Stage 3 Grade: 0 %
Stage 3 HR: 67 {beats}/min
Stage 4 DBP: 60 mmHg
Stage 4 SBP: 126 mmHg
Stage 6 DBP: 69 mmHg
Stage 6 Grade: 14 %
Stage 6 HR: 137 {beats}/min
Stage 6 SBP: 117 mmHg
Stage 6 Speed: 3.4 mph
Stage 7 HR: 146 {beats}/min
Stage 8 HR: 101 {beats}/min
Stage 8 Speed: 0 mph
Stage 9 DBP: 70 mmHg
Stage 9 Grade: 0 %
Stage 9 SBP: 119 mmHg
Stage 9 Speed: 0 mph

## 2014-09-24 MED ORDER — TECHNETIUM TC 99M SESTAMIBI GENERIC - CARDIOLITE
30.9000 | Freq: Once | INTRAVENOUS | Status: AC | PRN
  Administered 2014-09-24: 30.9 via INTRAVENOUS

## 2014-09-24 MED ORDER — TECHNETIUM TC 99M SESTAMIBI GENERIC - CARDIOLITE
10.8000 | Freq: Once | INTRAVENOUS | Status: AC | PRN
  Administered 2014-09-24: 11 via INTRAVENOUS

## 2014-10-02 ENCOUNTER — Ambulatory Visit (INDEPENDENT_AMBULATORY_CARE_PROVIDER_SITE_OTHER): Payer: Federal, State, Local not specified - PPO | Admitting: Surgery

## 2014-10-02 ENCOUNTER — Encounter: Payer: Self-pay | Admitting: Surgery

## 2014-10-02 ENCOUNTER — Other Ambulatory Visit: Payer: Federal, State, Local not specified - PPO

## 2014-10-02 VITALS — BP 122/65 | HR 67 | Resp 20 | Ht 65.0 in | Wt 149.0 lb

## 2014-10-02 DIAGNOSIS — I712 Thoracic aortic aneurysm, without rupture: Secondary | ICD-10-CM

## 2014-10-02 DIAGNOSIS — I7121 Aneurysm of the ascending aorta, without rupture: Secondary | ICD-10-CM

## 2014-10-03 ENCOUNTER — Encounter: Payer: Self-pay | Admitting: Surgery

## 2014-10-03 NOTE — Progress Notes (Signed)
HPI:  The patient returned to my office today for follow up of an ascending aortic aneurysm. I saw her initially on July 16, 2008, for evaluation of this aneurysm. I had operated on her on August 08, 1997, performing St. Jude mechanical valve replacement for severe aortic stenosis with bicuspid valve. She has some enlargement of her aorta at that time. Decision was made not to replace her ascending aorta because it did not look that large in the OR. She had been followed by Dr. Glori Bickers and was referred to me in 2010 for further evaluation concerning her ascending aorta. She had an MR angiogram of the chest on June 17, 2008, that showed no significant change in the size of her ascending aorta with a maximum diameter about 4.7 cm. This was a fusiform aneurysmal enlargement. When I reviewed her previous CTs compared to this, I did not feel that there had been any significant change since 1999. I decided to see her back again in April 2011 with a repeat MR angiogram of the chest to follow up on this.This showed stable fusiform enlargement of the ascending aorta with a maximum diameter of about 4.8 cm which did not change significantly back to 1999. I planned to see her back one year later with an MR angiogram of the chest but she was being followed closely by cardiology and did not feel like she needed to return to see me. She said she has been feeling well. She has had no problems with anticoagulation. Blood pressure has remained under good control on the beta-blocker. I last saw her in June 2014 and an MRI at that time showed a stable 4.8 cm ascending aortic aneurysm. I had recommended yearly follow up but she decided to skip a year. She does report some vague intermittent chest pains and recently underwent a nuclear stress test that was negative.   Current Outpatient Prescriptions  Medication Sig Dispense Refill  . aspirin 81 MG EC tablet Take 81 mg by mouth  daily.      . carvedilol (COREG) 3.125 MG tablet TAKE 1 TABLET TWICE DAILY. 60 tablet 3  . Cholecalciferol (VITAMIN D) 1000 UNITS capsule Take 1,000 Units by mouth daily.      Marland Kitchen co-enzyme Q-10 30 MG capsule Take 30 mg by mouth daily.     Marland Kitchen COUMADIN 10 MG tablet Take as directed by [facility] 30 tablet 3  . COUMADIN 7.5 MG tablet Take as directed by [facility]mad[facility] clinic 30 tablet 3  . fluvastatin (LESCOL) 40 MG capsule Take 1 capsule (40 mg total) by mouth at bedtime. 30 capsule 11  . metFORMIN (GLUCOPHAGE) 500 MG tablet TAKE 1 TABLET TWICE DAILY WITH A MEAL. 60 tablet 3  . Milk Thistle-Turmeric (SILYMARIN) CAPS Take 1 capsule by mouth daily.      No current facility-administered medications for this visit.     Physical Exam: BP 122/65 mmHg  Pulse 67  Resp 20  Ht 5\' 5"  (1.651 m)  Wt 149 lb (67.586 kg)  BMI 24.79 kg/m2  SpO2 98% She looks well. Cardiac exam shows a regular rate and rhythm with a crisp mechanical valve click. There is a soft systolic flow murmur across the prosthetic valve. There is no diastolic murmur. Lung exam is clear. There is no peripheral edema  Diagnostic Tests:  CLINICAL DATA: Evaluate ascending thoracic aortic aneurysm.  EXAM: MRA CHEST WITH CONTRAST  TECHNIQUE: Angiographic images of the chest were obtained using MRA technique without and  with intravenous contrast.  CONTRAST: 94mL MULTIHANCE GADOBENATE DIMEGLUMINE 529 MG/ML IV SOLN  COMPARISON: Chest MRI- 08/15/2012; 09/01/2010  FINDINGS: Vascular Findings:  There is grossly unchanged fusiform aneurysmal dilatation of the ascending thoracic aorta with measurements as follows. The thoracic aorta again tapers to a normal caliber at the level of the aortic arch. Incidental note is again made of a ductus diverticulum. No definite thoracic aortic dissection on this nongated examination.  Conventional configuration of the aortic arch. The branch vessels of the aortic arch appear widely  patent throughout their imaged course.  Cardiomegaly. No pericardial effusion. Post median sternotomy and aortic valve replacement.  Although this examination was not tailored for the evaluation of the pulmonary arteries, there are no discrete filling defects within the central pulmonary arterial tree to suggest central pulmonary embolism. Normal caliber of the main pulmonary artery.  -------------------------------------------------------------  Thoracic aortic measurements:  Sinotubular junction  34 mm as measured in greatest oblique sagittal dimension (image 63, series 10), unchanged.  Proximal ascending aorta  46 mm as measured in greatest oblique axial dimension at the level of the main pulmonary artery (image 43, series 16) an approximately 44 mm in greatest oblique sagittal dimension (image 52, series 10) both measurements of which are grossly unchanged since the 08/2010 examination  Aortic arch aorta  25 mm as measured in greatest oblique sagittal dimension (image 56, series 10), unchanged.  Proximal descending thoracic aorta  22 mm as measured in greatest oblique axial dimension at the level of the main pulmonary artery (image 43, series 16), unchanged.  Distal descending thoracic aorta  20 mm as measured in greatest oblique sagittal dimension at the level of the diaphragmatic hiatus (image 59, series 10), unchanged.  Review of the MIP images confirms the above findings.  -------------------------------------------------------------  Non-Vascular Findings:  No focal airspace opacities. No pleural effusion.  IMPRESSION: 1. Unchanged fusiform aneurysmal dilatation of the ascending thoracic aorta measuring approximately approximately 46 mm in greatest diameter, stable since the 08/2010 examination. 2. Stable sequela of prior median sternotomy and aortic valve replacement.   Electronically Signed  By: Sandi Mariscal M.D.  On:  08/19/2014 11:37   Impression:  She has a stable 4.6-4.8 cm ascending aortic aneurysm with a history of prior St. Jude aortic valve replacement for bicuspid aortic valve disease. I recommended that we repeat her MR angiogram in 1 year. She has maintained good blood pressure control with a beta blocker. This can be followed by Dr. Burt Knack or myself as long as the patient returns for regular followup.  Plan:  I'll see her back in 1 year with an MR angiogram of the chest.   Gaye Pollack, MD [facility] [phone]

## 2014-10-29 ENCOUNTER — Ambulatory Visit (INDEPENDENT_AMBULATORY_CARE_PROVIDER_SITE_OTHER): Payer: Federal, State, Local not specified - PPO | Admitting: Surgery

## 2014-10-29 DIAGNOSIS — Q231 Congenital insufficiency of aortic valve: Secondary | ICD-10-CM

## 2014-10-29 DIAGNOSIS — Z954 Presence of other heart-valve replacement: Secondary | ICD-10-CM | POA: Diagnosis not present

## 2014-10-29 DIAGNOSIS — Z952 Presence of prosthetic heart valve: Secondary | ICD-10-CM

## 2014-10-29 LAB — POCT INR: INR: 3.3

## 2014-11-10 ENCOUNTER — Other Ambulatory Visit: Payer: Self-pay | Admitting: Cardiovascular Disease

## 2014-11-27 ENCOUNTER — Ambulatory Visit (INDEPENDENT_AMBULATORY_CARE_PROVIDER_SITE_OTHER): Payer: Federal, State, Local not specified - PPO | Admitting: *Deleted

## 2014-11-27 DIAGNOSIS — Z954 Presence of other heart-valve replacement: Secondary | ICD-10-CM | POA: Diagnosis not present

## 2014-11-27 DIAGNOSIS — Q231 Congenital insufficiency of aortic valve: Secondary | ICD-10-CM

## 2014-11-27 DIAGNOSIS — Z952 Presence of prosthetic heart valve: Secondary | ICD-10-CM

## 2014-11-27 LAB — POCT INR: INR: 2.8

## 2014-12-18 ENCOUNTER — Other Ambulatory Visit: Payer: Federal, State, Local not specified - PPO

## 2014-12-23 ENCOUNTER — Other Ambulatory Visit: Payer: Federal, State, Local not specified - PPO

## 2014-12-24 ENCOUNTER — Other Ambulatory Visit (INDEPENDENT_AMBULATORY_CARE_PROVIDER_SITE_OTHER): Payer: Federal, State, Local not specified - PPO | Admitting: *Deleted

## 2014-12-24 DIAGNOSIS — I10 Essential (primary) hypertension: Secondary | ICD-10-CM

## 2014-12-24 DIAGNOSIS — R002 Palpitations: Secondary | ICD-10-CM

## 2014-12-24 LAB — LIPID PANEL
Cholesterol: 150 mg/dL (ref 0–200)
HDL: 45.1 mg/dL (ref >39.00)
LDL Cholesterol: 94 mg/dL (ref 0–99)
NONHDL: 104.71
Total CHOL/HDL Ratio: 3
Triglycerides: 56 mg/dL (ref 0.0–149.0)
VLDL: 11.2 mg/dL (ref 0.0–40.0)

## 2014-12-24 LAB — HEPATIC FUNCTION PANEL
ALBUMIN: 3.9 g/dL (ref 3.5–5.2)
ALK PHOS: 59 U/L (ref 39–117)
ALT: 22 U/L (ref 0–35)
AST: 17 U/L (ref 0–37)
Bilirubin, Direct: 0.3 mg/dL (ref 0.0–0.3)
Total Bilirubin: 1.5 mg/dL — ABNORMAL HIGH (ref 0.2–1.2)
Total Protein: 6.4 g/dL (ref 6.0–8.3)

## 2014-12-24 LAB — GAMMA GT: GGT: 37 U/L (ref 7–51)

## 2014-12-24 NOTE — Addendum Note (Signed)
Addended by: Eulis Foster on: 12/24/2014 07:39 AM   Modules accepted: Orders

## 2014-12-24 NOTE — Addendum Note (Signed)
Addended by: Eulis Foster on: 12/24/2014 07:36 AM   Modules accepted: Orders

## 2014-12-26 ENCOUNTER — Ambulatory Visit (INDEPENDENT_AMBULATORY_CARE_PROVIDER_SITE_OTHER): Payer: Federal, State, Local not specified - PPO | Admitting: *Deleted

## 2014-12-26 DIAGNOSIS — Q231 Congenital insufficiency of aortic valve: Secondary | ICD-10-CM

## 2014-12-26 DIAGNOSIS — Z954 Presence of other heart-valve replacement: Secondary | ICD-10-CM | POA: Diagnosis not present

## 2014-12-26 DIAGNOSIS — Z952 Presence of prosthetic heart valve: Secondary | ICD-10-CM

## 2014-12-26 LAB — POCT INR: INR: 2.7

## 2014-12-29 ENCOUNTER — Other Ambulatory Visit (HOSPITAL_COMMUNITY): Payer: Self-pay | Admitting: Internal Medicine

## 2015-01-30 ENCOUNTER — Ambulatory Visit (INDEPENDENT_AMBULATORY_CARE_PROVIDER_SITE_OTHER): Payer: Federal, State, Local not specified - PPO | Admitting: *Deleted

## 2015-01-30 DIAGNOSIS — Z954 Presence of other heart-valve replacement: Secondary | ICD-10-CM | POA: Diagnosis not present

## 2015-01-30 DIAGNOSIS — Q231 Congenital insufficiency of aortic valve: Secondary | ICD-10-CM

## 2015-01-30 DIAGNOSIS — Z952 Presence of prosthetic heart valve: Secondary | ICD-10-CM

## 2015-01-30 LAB — POCT INR: INR: 3.2

## 2015-02-19 ENCOUNTER — Ambulatory Visit (INDEPENDENT_AMBULATORY_CARE_PROVIDER_SITE_OTHER): Payer: Federal, State, Local not specified - PPO | Admitting: *Deleted

## 2015-02-19 DIAGNOSIS — Q231 Congenital insufficiency of aortic valve: Secondary | ICD-10-CM | POA: Diagnosis not present

## 2015-02-19 DIAGNOSIS — Z954 Presence of other heart-valve replacement: Secondary | ICD-10-CM

## 2015-02-19 DIAGNOSIS — Z952 Presence of prosthetic heart valve: Secondary | ICD-10-CM

## 2015-02-19 LAB — POCT INR: INR: 2.8

## 2015-03-08 ENCOUNTER — Other Ambulatory Visit: Payer: Self-pay | Admitting: Internal Medicine

## 2015-03-19 ENCOUNTER — Ambulatory Visit (INDEPENDENT_AMBULATORY_CARE_PROVIDER_SITE_OTHER): Payer: Federal, State, Local not specified - PPO | Admitting: Surgery

## 2015-03-19 DIAGNOSIS — Q231 Congenital insufficiency of aortic valve: Secondary | ICD-10-CM

## 2015-03-19 DIAGNOSIS — Z952 Presence of prosthetic heart valve: Secondary | ICD-10-CM

## 2015-03-19 DIAGNOSIS — Z954 Presence of other heart-valve replacement: Secondary | ICD-10-CM

## 2015-03-19 LAB — POCT INR: INR: 2.7

## 2015-04-30 ENCOUNTER — Ambulatory Visit (INDEPENDENT_AMBULATORY_CARE_PROVIDER_SITE_OTHER): Payer: Federal, State, Local not specified - PPO | Admitting: *Deleted

## 2015-04-30 DIAGNOSIS — Z954 Presence of other heart-valve replacement: Secondary | ICD-10-CM

## 2015-04-30 DIAGNOSIS — Q231 Congenital insufficiency of aortic valve: Secondary | ICD-10-CM | POA: Diagnosis not present

## 2015-04-30 DIAGNOSIS — Z952 Presence of prosthetic heart valve: Secondary | ICD-10-CM

## 2015-04-30 LAB — POCT INR: INR: 2.5

## 2015-05-06 ENCOUNTER — Other Ambulatory Visit: Payer: Self-pay | Admitting: Internal Medicine

## 2015-06-01 ENCOUNTER — Other Ambulatory Visit: Payer: Self-pay | Admitting: Cardiovascular Disease

## 2015-06-11 ENCOUNTER — Ambulatory Visit (INDEPENDENT_AMBULATORY_CARE_PROVIDER_SITE_OTHER): Payer: Federal, State, Local not specified - PPO | Admitting: *Deleted

## 2015-06-11 DIAGNOSIS — Q231 Congenital insufficiency of aortic valve: Secondary | ICD-10-CM | POA: Diagnosis not present

## 2015-06-11 DIAGNOSIS — Z954 Presence of other heart-valve replacement: Secondary | ICD-10-CM | POA: Diagnosis not present

## 2015-06-11 DIAGNOSIS — Z952 Presence of prosthetic heart valve: Secondary | ICD-10-CM

## 2015-06-11 LAB — POCT INR: INR: 2.1

## 2015-07-23 ENCOUNTER — Ambulatory Visit (INDEPENDENT_AMBULATORY_CARE_PROVIDER_SITE_OTHER): Payer: Federal, State, Local not specified - PPO | Admitting: *Deleted

## 2015-07-23 DIAGNOSIS — Z954 Presence of other heart-valve replacement: Secondary | ICD-10-CM

## 2015-07-23 DIAGNOSIS — Q231 Congenital insufficiency of aortic valve: Secondary | ICD-10-CM | POA: Diagnosis not present

## 2015-07-23 DIAGNOSIS — Z952 Presence of prosthetic heart valve: Secondary | ICD-10-CM

## 2015-07-23 LAB — POCT INR: INR: 2.1

## 2015-09-02 ENCOUNTER — Other Ambulatory Visit: Payer: Self-pay | Admitting: *Deleted

## 2015-09-02 DIAGNOSIS — I712 Thoracic aortic aneurysm, without rupture: Secondary | ICD-10-CM

## 2015-09-02 DIAGNOSIS — I7121 Aneurysm of the ascending aorta, without rupture: Secondary | ICD-10-CM

## 2015-09-03 ENCOUNTER — Ambulatory Visit (INDEPENDENT_AMBULATORY_CARE_PROVIDER_SITE_OTHER): Payer: Federal, State, Local not specified - PPO | Admitting: *Deleted

## 2015-09-03 DIAGNOSIS — Z952 Presence of prosthetic heart valve: Secondary | ICD-10-CM

## 2015-09-03 DIAGNOSIS — Z954 Presence of other heart-valve replacement: Secondary | ICD-10-CM

## 2015-09-03 DIAGNOSIS — Q2381 Bicuspid aortic valve: Secondary | ICD-10-CM

## 2015-09-03 DIAGNOSIS — Q231 Congenital insufficiency of aortic valve: Secondary | ICD-10-CM

## 2015-09-03 LAB — POCT INR: INR: 2.6

## 2015-09-09 ENCOUNTER — Other Ambulatory Visit: Payer: Self-pay | Admitting: Internal Medicine

## 2015-09-09 ENCOUNTER — Ambulatory Visit
Admission: RE | Admit: 2015-09-09 | Discharge: 2015-09-09 | Disposition: A | Discharge status: Home / Self Care | Payer: Federal, State, Local not specified - PPO | Source: Ambulatory Visit | Attending: Internal Medicine | Admitting: Internal Medicine

## 2015-09-09 DIAGNOSIS — R1310 Dysphagia, unspecified: Secondary | ICD-10-CM

## 2015-09-09 DIAGNOSIS — R079 Chest pain, unspecified: Secondary | ICD-10-CM

## 2015-09-09 DIAGNOSIS — R05 Cough: Secondary | ICD-10-CM | POA: Diagnosis not present

## 2015-09-09 DIAGNOSIS — R12 Heartburn: Secondary | ICD-10-CM | POA: Diagnosis not present

## 2015-09-09 DIAGNOSIS — K219 Gastro-esophageal reflux disease without esophagitis: Secondary | ICD-10-CM | POA: Diagnosis not present

## 2015-09-15 ENCOUNTER — Other Ambulatory Visit: Payer: Self-pay | Admitting: Cardiovascular Disease

## 2015-09-28 ENCOUNTER — Other Ambulatory Visit: Payer: Self-pay | Admitting: Cardiovascular Disease

## 2015-10-01 ENCOUNTER — Ambulatory Visit
Admission: RE | Admit: 2015-10-01 | Discharge: 2015-10-01 | Disposition: A | Discharge status: Home / Self Care | Payer: Federal, State, Local not specified - PPO | Source: Ambulatory Visit | Attending: Surgery | Admitting: Surgery

## 2015-10-01 ENCOUNTER — Encounter: Payer: Self-pay | Admitting: Surgery

## 2015-10-01 ENCOUNTER — Ambulatory Visit (INDEPENDENT_AMBULATORY_CARE_PROVIDER_SITE_OTHER): Payer: Federal, State, Local not specified - PPO | Admitting: Surgery

## 2015-10-01 VITALS — BP 117/72 | HR 60 | Resp 20 | Ht 65.0 in | Wt 155.0 lb

## 2015-10-01 DIAGNOSIS — I7121 Aneurysm of the ascending aorta, without rupture: Secondary | ICD-10-CM

## 2015-10-01 DIAGNOSIS — I712 Thoracic aortic aneurysm, without rupture: Secondary | ICD-10-CM | POA: Diagnosis not present

## 2015-10-01 MED ORDER — GADOBENATE DIMEGLUMINE 529 MG/ML IV SOLN: 14.0000 mL | Freq: Once | INTRAVENOUS | Status: DC | PRN

## 2015-10-02 ENCOUNTER — Encounter: Payer: Self-pay | Admitting: Surgery

## 2015-10-02 NOTE — Progress Notes (Signed)
HPI:  The patient returns today for follow up of an ascending aortic aneurysm following AVR with a St. Jude mechanical valve in 1999 for severe bicuspid aortic stenosis. I have been following her ascending aorta since 2010 and it has been stable with measurements between 4.6-4.8 cm. I last saw her a year ago and it was measured at 4.6 cm. She continues to do well with no symptoms of any kind.   Current Outpatient Prescriptions  Medication Sig Dispense Refill  . aspirin 81 MG EC tablet Take 81 mg by mouth daily.      . carvedilol (COREG) 3.125 MG tablet Take 1 tablet (3.125 mg total) by mouth 2 (two) times daily. 60 tablet 3  . Cholecalciferol (VITAMIN D) 1000 UNITS capsule Take 1,000 Units by mouth daily.      Marland Kitchen co-enzyme Q-10 30 MG capsule Take 30 mg by mouth daily.     . fluvastatin (LESCOL) 40 MG capsule Take 1 capsule (40 mg total) by mouth at bedtime. Please call and schedule a one year follow up appointment 30 capsule 0  . metFORMIN (GLUCOPHAGE) 500 MG tablet TAKE 1 TABLET TWICE DAILY WITH A MEAL. 60 tablet 3  . Milk Thistle-Turmeric (SILYMARIN) CAPS Take 1 capsule by mouth daily.     Marland Kitchen warfarin (COUMADIN) 10 MG tablet TAKE AS DIRECTED BY COUMADIN CLINIC. 30 tablet 3  . warfarin (COUMADIN) 7.5 MG tablet TAKE AS DIRECTED BY COUMADIN CLINIC. 30 tablet 1   No current facility-administered medications for this visit.     Physical Exam: BP 117/72 mmHg  Pulse 60  Resp 20  Ht 5\' 5"  (1.651 m)  Wt 155 lb (70.308 kg)  BMI 25.79 kg/m2  SpO2 99% She looks well. Cardiac exam shows a regular rate and rhythm with a crisp mechanical valve click. There is a soft systolic flow murmur across the prosthetic valve. There is no diastolic murmur. Lung exam is clear.  Diagnostic Tests:  EXAM: MRA CHEST WITH OR WITHOUT CONTRAST  TECHNIQUE: Angiographic images of the chest were obtained using MRA technique without and with intravenous contrast.  Creatinine was obtained on site at  [facility] at [address]  Results: Creatinine 0.7 mg/dL. GFR 103  CONTRAST: 14 mL MultiHance  COMPARISON: Prior MRI of the chest 08/19/2014  FINDINGS: VASCULAR  Fusiform aneurysmal dilatation of the tubular portion of the ascending thoracic aorta measures 4.7 cm in maximal diameter perhaps incrementally increased compared to 4.6 cm previously. The ascending aortic diameter tapers at the proximal arch becoming normal in caliber by the mid arch. The descending thoracic aorta is also normal in caliber. A ductus diverticulum is again noted and demonstrates no significant interval change. No focal atherosclerotic plaque or evidence of stenosis.  NON VASCULAR  Surgical changes of median sternotomy. Metallic susceptibility artifact in the region the aortic root consistent with prior aortic valve repair. There is no abnormal signal or enhancement within the visualized portions of the mediastinum, lungs, upper abdomen or musculoskeletal structures.  IMPRESSION: Stable to perhaps incrementally enlarged fusiform ascending thoracic aortic aneurysm presently measuring 4.7 cm compared to 4.6 cm on 08/19/2014.  Signed,  Criselda Peaches, MD  [facility]  [facility]   Electronically Signed  By: Jacqulynn Cadet M.D.  On: 10/01/2015 10:01  Impression:  The ascending aortic aneurysm is stable with a measurement of 4.7 cm this year. Her BP is under good control on a beta blocker.  Plan:  I  will see her in one year with an MRA of the chest.   Gaye Pollack, MD [facility] [phone]

## 2015-10-12 ENCOUNTER — Other Ambulatory Visit: Payer: Self-pay | Admitting: Internal Medicine

## 2015-10-12 ENCOUNTER — Other Ambulatory Visit: Payer: Self-pay | Admitting: Cardiovascular Disease

## 2015-10-19 ENCOUNTER — Other Ambulatory Visit: Payer: Self-pay | Admitting: Cardiovascular Disease

## 2015-10-22 ENCOUNTER — Ambulatory Visit (INDEPENDENT_AMBULATORY_CARE_PROVIDER_SITE_OTHER): Payer: Federal, State, Local not specified - PPO | Admitting: *Deleted

## 2015-10-22 DIAGNOSIS — Z954 Presence of other heart-valve replacement: Secondary | ICD-10-CM

## 2015-10-22 DIAGNOSIS — Q231 Congenital insufficiency of aortic valve: Secondary | ICD-10-CM

## 2015-10-22 DIAGNOSIS — Z952 Presence of prosthetic heart valve: Secondary | ICD-10-CM

## 2015-10-22 LAB — POCT INR: INR: 3.6

## 2015-11-19 ENCOUNTER — Encounter (INDEPENDENT_AMBULATORY_CARE_PROVIDER_SITE_OTHER): Payer: Self-pay

## 2015-11-19 ENCOUNTER — Ambulatory Visit (INDEPENDENT_AMBULATORY_CARE_PROVIDER_SITE_OTHER): Payer: Federal, State, Local not specified - PPO | Admitting: *Deleted

## 2015-11-19 DIAGNOSIS — Z954 Presence of other heart-valve replacement: Secondary | ICD-10-CM | POA: Diagnosis not present

## 2015-11-19 DIAGNOSIS — Z952 Presence of prosthetic heart valve: Secondary | ICD-10-CM

## 2015-11-19 DIAGNOSIS — Q231 Congenital insufficiency of aortic valve: Secondary | ICD-10-CM | POA: Diagnosis not present

## 2015-11-19 LAB — POCT INR: INR: 2.6

## 2015-12-17 ENCOUNTER — Ambulatory Visit (INDEPENDENT_AMBULATORY_CARE_PROVIDER_SITE_OTHER): Payer: Federal, State, Local not specified - PPO | Admitting: *Deleted

## 2015-12-17 DIAGNOSIS — Z954 Presence of other heart-valve replacement: Secondary | ICD-10-CM | POA: Diagnosis not present

## 2015-12-17 DIAGNOSIS — Q231 Congenital insufficiency of aortic valve: Secondary | ICD-10-CM

## 2015-12-17 DIAGNOSIS — Z952 Presence of prosthetic heart valve: Secondary | ICD-10-CM

## 2015-12-17 LAB — POCT INR: INR: 2.3

## 2016-01-13 ENCOUNTER — Telehealth: Payer: Self-pay | Admitting: *Deleted

## 2016-01-13 NOTE — Telephone Encounter (Signed)
Patient was calling about needing antibiotics for a dental procedure tomorrow. Informed patient that it could be possible that she needs antibiotic, but Dr. Burt Knack is not here today. Patient stated that was fine that her dentist would be giving her prescription for antibiotics.

## 2016-01-14 DIAGNOSIS — K080 Exfoliation of teeth due to systemic causes: Secondary | ICD-10-CM | POA: Diagnosis not present

## 2016-01-16 ENCOUNTER — Encounter (INDEPENDENT_AMBULATORY_CARE_PROVIDER_SITE_OTHER): Payer: Self-pay

## 2016-01-16 ENCOUNTER — Ambulatory Visit (INDEPENDENT_AMBULATORY_CARE_PROVIDER_SITE_OTHER): Payer: Federal, State, Local not specified - PPO | Admitting: *Deleted

## 2016-01-16 ENCOUNTER — Ambulatory Visit (INDEPENDENT_AMBULATORY_CARE_PROVIDER_SITE_OTHER): Payer: Federal, State, Local not specified - PPO | Admitting: Cardiovascular Disease

## 2016-01-16 ENCOUNTER — Encounter: Payer: Self-pay | Admitting: Cardiovascular Disease

## 2016-01-16 VITALS — BP 112/60 | HR 62 | Ht 65.5 in | Wt 152.8 lb

## 2016-01-16 DIAGNOSIS — Q231 Congenital insufficiency of aortic valve: Secondary | ICD-10-CM | POA: Diagnosis not present

## 2016-01-16 DIAGNOSIS — Z954 Presence of other heart-valve replacement: Secondary | ICD-10-CM | POA: Diagnosis not present

## 2016-01-16 DIAGNOSIS — E785 Hyperlipidemia, unspecified: Secondary | ICD-10-CM

## 2016-01-16 DIAGNOSIS — I359 Nonrheumatic aortic valve disorder, unspecified: Secondary | ICD-10-CM | POA: Diagnosis not present

## 2016-01-16 DIAGNOSIS — Z952 Presence of prosthetic heart valve: Secondary | ICD-10-CM

## 2016-01-16 LAB — LIPID PANEL
CHOL/HDL RATIO: 3.7 ratio (ref <=5.0)
CHOLESTEROL: 169 mg/dL (ref 125–200)
HDL: 46 mg/dL (ref >=46)
LDL Cholesterol: 102 mg/dL (ref <130)
Triglycerides: 105 mg/dL (ref <150)
VLDL: 21 mg/dL (ref <30)

## 2016-01-16 LAB — HEPATIC FUNCTION PANEL
ALBUMIN: 4.4 g/dL (ref 3.6–5.1)
ALT: 14 U/L (ref 6–29)
AST: 20 U/L (ref 10–35)
Alkaline Phosphatase: 48 U/L (ref 33–130)
Bilirubin, Direct: 0.3 mg/dL — ABNORMAL HIGH (ref <=0.2)
Indirect Bilirubin: 2 mg/dL — ABNORMAL HIGH (ref 0.2–1.2)
TOTAL PROTEIN: 6.7 g/dL (ref 6.1–8.1)
Total Bilirubin: 2.3 mg/dL — ABNORMAL HIGH (ref 0.2–1.2)

## 2016-01-16 LAB — POCT INR: INR: 4.7

## 2016-01-16 NOTE — Progress Notes (Signed)
Cardiology Office Note Date:  01/16/2016   ID:  Monica Key, DOB 05-13-1955, MRN [id number]  PCP:  Henrine Screws, MD  Cardiologist:  Sherren Mocha, MD    Chief Complaint  Patient presents with  . Aortic Stenosis     History of Present Illness: Monica Key is a 60 y.o. female who presents for follow-up evaluation. The patient has a history of bicuspid aortic valve stenosis status post mechanical aortic valve replacement in 1999. She's also been followed for a dilated ascending aorta and see Dr Cyndia Bent with surveillance imaging.   The patient has been doing well. She denies chest pain, shortness of breath, heart palpitations, or edema. When she was seen last year she complained of nocturnal chest discomfort. A nuclear stress test was done and showed no ischemia. In retrospect, she thinks her symptoms were related to gastroesophageal reflux disease. This has improved with lifestyle modification.  Past Medical History:  Diagnosis Date  . Diabetes mellitus without complication ([place])   . Hypertension   . Pinched nerve in neck     Past Surgical History:  Procedure Laterality Date  . CARDIAC SURGERY      Current Outpatient Prescriptions  Medication Sig Dispense Refill  . aspirin 81 MG EC tablet Take 81 mg by mouth daily.      . carvedilol (COREG) 3.125 MG tablet Take 1 tablet (3.125 mg total) by mouth 2 (two) times daily. 60 tablet 3  . Cholecalciferol (VITAMIN D) 1000 UNITS capsule Take 1,000 Units by mouth daily.      Marland Kitchen co-enzyme Q-10 30 MG capsule Take 30 mg by mouth daily.     . fluvastatin (LESCOL) 40 MG capsule Take 1 capsule (40 mg total) by mouth at bedtime. 30 capsule 2  . metFORMIN (GLUCOPHAGE) 500 MG tablet Take 500 mg by mouth 2 (two) times daily with a meal.    . warfarin (COUMADIN) 10 MG tablet TAKE AS DIRECTED BY COUMADIN CLINIC. 30 tablet 3  . warfarin (COUMADIN) 7.5 MG tablet TAKE AS DIRECTED BY COUMADIN CLINIC. 30 tablet 0   No current  facility-administered medications for this visit.     Allergies:   Hydralazine hcl; Penicillins; Sulfonamide derivatives; and Vancomycin   Social History:  The patient  reports that she has never smoked. She does not have any smokeless tobacco history on file. She reports that she does not drink alcohol or use drugs.   Family History:  The patient's  family history includes Alzheimer's disease in her mother; Angina in her mother; Transient ischemic attack in her father.    ROS:  Please see the history of present illness.  All other systems are reviewed and negative.    PHYSICAL EXAM: VS:  BP 112/60   Pulse 62   Ht 5' 5.5" (1.664 m)   Wt 69.3 kg (152 lb 12.8 oz)   BMI 25.04 kg/m  , BMI Body mass index is 25.04 kg/m. GEN: Well nourished, well developed, in no acute distress  HEENT: normal  Neck: no JVD, no masses. No carotid bruits Cardiac: RRR with 2/6 systolic ejection murmur at the right upper sternal border with normal mechanical A2          Respiratory:  clear to auscultation bilaterally, normal work of breathing GI: soft, nontender, nondistended, + BS MS: no deformity or atrophy  Ext: no pretibial edema, pedal pulses 2+= bilaterally Skin: warm and dry, no rash Neuro:  Strength and sensation are intact Psych: euthymic mood, full affect  EKG:  EKG is ordered today. The ekg ordered today shows NSR 61 bpm, within normal limits  Recent Labs: No results found for requested labs within last 8760 hours.   Lipid Panel     Component Value Date/Time   CHOL 150 12/24/2014 0740   CHOL 133 12/12/2012 0743   TRIG 56.0 12/24/2014 0740   TRIG 96 12/12/2012 0743   HDL 45.10 12/24/2014 0740   HDL 45 12/12/2012 0743   CHOLHDL 3 12/24/2014 0740   VLDL 11.2 12/24/2014 0740   LDLCALC 94 12/24/2014 0740   LDLCALC 69 12/12/2012 0743   LDLDIRECT 151.7 06/21/2006 0902      Wt Readings from Last 3 Encounters:  01/16/16 69.3 kg (152 lb 12.8 oz)  10/01/15 70.3 kg (155 lb)  10/02/14  67.6 kg (149 lb)     Cardiac Studies Reviewed: Stress Myoview 10-04-14: Stress Measurements   Baseline Vitals  Rest HR 59 bpm    Rest BP 114/57 mmHg    Exercise Time  Exercise duration (min) 9 min    Exercise duration (sec) 22 sec    Peak Stress Vitals  Peak HR 146 BPM    Exercise Data  MPHR 160 bpm    Percent HR 92 %    RPE 20,002     Estimated workload 10.7 METS       Nuclear Stress Measurements   LV sys vol 34 mL    TID 1.09     LV dias vol 80 mL    Nuc Stress EF 57 %    SSS 1     SRS 1     SDS 0          Nuclear Stress Findings   Isotope administration The isotope used for nuclear imaging was Tc40m Sestamibi. IV Site: right antecubital fossa.Rest isotope was administered on 10/04/2014 at 07:45 with an IV injection of 10.8 mCi. Rest SPECT images were obtained approximately 45 minutes post tracer injection. Stress isotope was administered on Oct 04, 2014 at 09:00 with an IV injection of 30.9 mCi at peak exercise Stress SPECT images were obtained approximately 30 minutes post tracer injection.    Nuclear Study Quality Overall image quality is good.    Nuclear Measurements Study was gated.    Rest Perfusion Nl perfusion    Stress Perfusion Nl perfusion    Perfusion Summary  Nl       ASSESSMENT AND PLAN: 1.  Bicuspid aortic valve status post mechanical aortic valve replacement. NYHA functional class I symptoms. Continues to tolerate warfarin and low-dose aspirin without bleeding problems. Physical exam is unchanged. It has been 6 years since her last echo, will update. I will see back in one year.  2. Ascending aortic aneurysm: stable with most recent measurement 4.7 cm. Continue beta-blocker. Dr Vivi Martens office note reviewed.   3. Hyperlipidemia: Now taking fluvastatin 40 mg daily. Will check lipids and LFTs today. Patient is fasting this morning.  Current medicines are reviewed with the patient today.  The patient does not have concerns regarding  medicines.  Labs/ tests ordered today include:  No orders of the defined types were placed in this encounter.   Disposition:   FU one year  Signed, Sherren Mocha, MD  01/16/2016 8:10 AM    [facility] [place], [place],   [address] Phone: [phone]; Fax: [phone]

## 2016-01-16 NOTE — Patient Instructions (Signed)
Medication Instructions:  Your physician recommends that you continue on your current medications as directed. Please refer to the Current Medication list given to you today.   Labwork: Lab work to be done today--lipid and liver profiles  Testing/Procedures: Your physician has requested that you have an echocardiogram. Echocardiography is a painless test that uses sound waves to create images of your heart. It provides your doctor with information about the size and shape of your heart and how well your heart's chambers and valves are working. This procedure takes approximately one hour. There are no restrictions for this procedure.    Follow-Up: Your physician wants you to follow-up in: 12 months.  You will receive a reminder letter in the mail two months in advance. If you don't receive a letter, please call our office to schedule the follow-up appointment.   Any Other Special Instructions Will Be Listed Below (If Applicable).     If you need a refill on your cardiac medications before your next appointment, please call your pharmacy.

## 2016-01-27 ENCOUNTER — Other Ambulatory Visit: Payer: Self-pay | Admitting: Cardiovascular Disease

## 2016-01-27 ENCOUNTER — Encounter (INDEPENDENT_AMBULATORY_CARE_PROVIDER_SITE_OTHER): Payer: Self-pay

## 2016-01-27 ENCOUNTER — Ambulatory Visit (INDEPENDENT_AMBULATORY_CARE_PROVIDER_SITE_OTHER): Payer: Federal, State, Local not specified - PPO | Admitting: *Deleted

## 2016-01-27 DIAGNOSIS — Q231 Congenital insufficiency of aortic valve: Secondary | ICD-10-CM | POA: Diagnosis not present

## 2016-01-27 DIAGNOSIS — Z952 Presence of prosthetic heart valve: Secondary | ICD-10-CM

## 2016-01-27 DIAGNOSIS — Z954 Presence of other heart-valve replacement: Secondary | ICD-10-CM | POA: Diagnosis not present

## 2016-01-27 LAB — POCT INR: INR: 1.9

## 2016-01-28 ENCOUNTER — Other Ambulatory Visit (HOSPITAL_COMMUNITY): Payer: Federal, State, Local not specified - PPO

## 2016-02-01 ENCOUNTER — Other Ambulatory Visit: Payer: Self-pay | Admitting: Cardiovascular Disease

## 2016-02-03 ENCOUNTER — Other Ambulatory Visit: Payer: Self-pay

## 2016-02-03 DIAGNOSIS — R17 Unspecified jaundice: Secondary | ICD-10-CM

## 2016-02-04 ENCOUNTER — Other Ambulatory Visit: Payer: Federal, State, Local not specified - PPO | Admitting: *Deleted

## 2016-02-04 DIAGNOSIS — R17 Unspecified jaundice: Secondary | ICD-10-CM

## 2016-02-04 LAB — CBC WITH DIFFERENTIAL/PLATELET
BASOS ABS: 31 {cells}/uL (ref 0–200)
Basophils Relative: 1 %
EOS ABS: 93 {cells}/uL (ref 15–500)
Eosinophils Relative: 3 %
HCT: 40 % (ref 35.0–45.0)
Hemoglobin: 13.5 g/dL (ref 11.7–15.5)
LYMPHS PCT: 30 %
Lymphs Abs: 930 cells/uL (ref 850–3900)
MCH: 30.8 pg (ref 27.0–33.0)
MCHC: 33.8 g/dL (ref 32.0–36.0)
MCV: 91.1 fL (ref 80.0–100.0)
MONOS PCT: 11 %
MPV: 11.1 fL (ref 7.5–12.5)
Monocytes Absolute: 341 cells/uL (ref 200–950)
NEUTROS ABS: 1705 {cells}/uL (ref 1500–7800)
Neutrophils Relative %: 55 %
PLATELETS: 210 10*3/uL (ref 140–400)
RBC: 4.39 MIL/uL (ref 3.80–5.10)
RDW: 13.6 % (ref 11.0–15.0)
WBC: 3.1 10*3/uL — ABNORMAL LOW (ref 3.8–10.8)

## 2016-02-04 LAB — RETICULOCYTES
ABS Retic: 43900 cells/uL (ref 20000–80000)
RBC.: 4.39 MIL/uL (ref 3.80–5.10)
Retic Ct Pct: 1 %

## 2016-02-12 DIAGNOSIS — L821 Other seborrheic keratosis: Secondary | ICD-10-CM | POA: Diagnosis not present

## 2016-02-12 DIAGNOSIS — L814 Other melanin hyperpigmentation: Secondary | ICD-10-CM | POA: Diagnosis not present

## 2016-02-12 DIAGNOSIS — Z85828 Personal history of other malignant neoplasm of skin: Secondary | ICD-10-CM | POA: Diagnosis not present

## 2016-02-12 DIAGNOSIS — D1801 Hemangioma of skin and subcutaneous tissue: Secondary | ICD-10-CM | POA: Diagnosis not present

## 2016-02-17 DIAGNOSIS — Z23 Encounter for immunization: Secondary | ICD-10-CM | POA: Diagnosis not present

## 2016-02-17 DIAGNOSIS — R739 Hyperglycemia, unspecified: Secondary | ICD-10-CM | POA: Diagnosis not present

## 2016-02-17 DIAGNOSIS — E785 Hyperlipidemia, unspecified: Secondary | ICD-10-CM | POA: Diagnosis not present

## 2016-02-17 DIAGNOSIS — R12 Heartburn: Secondary | ICD-10-CM | POA: Diagnosis not present

## 2016-02-17 DIAGNOSIS — K625 Hemorrhage of anus and rectum: Secondary | ICD-10-CM | POA: Diagnosis not present

## 2016-02-17 DIAGNOSIS — Z79899 Other long term (current) drug therapy: Secondary | ICD-10-CM | POA: Diagnosis not present

## 2016-02-17 DIAGNOSIS — R079 Chest pain, unspecified: Secondary | ICD-10-CM | POA: Diagnosis not present

## 2016-02-17 DIAGNOSIS — Z954 Presence of other heart-valve replacement: Secondary | ICD-10-CM | POA: Diagnosis not present

## 2016-02-17 DIAGNOSIS — E559 Vitamin D deficiency, unspecified: Secondary | ICD-10-CM | POA: Diagnosis not present

## 2016-02-17 DIAGNOSIS — Z0000 Encounter for general adult medical examination without abnormal findings: Secondary | ICD-10-CM | POA: Diagnosis not present

## 2016-02-18 ENCOUNTER — Ambulatory Visit (INDEPENDENT_AMBULATORY_CARE_PROVIDER_SITE_OTHER): Payer: Federal, State, Local not specified - PPO | Admitting: *Deleted

## 2016-02-18 DIAGNOSIS — Z952 Presence of prosthetic heart valve: Secondary | ICD-10-CM

## 2016-02-18 DIAGNOSIS — Q231 Congenital insufficiency of aortic valve: Secondary | ICD-10-CM

## 2016-02-18 LAB — POCT INR: INR: 3.4

## 2016-03-03 ENCOUNTER — Ambulatory Visit (INDEPENDENT_AMBULATORY_CARE_PROVIDER_SITE_OTHER): Payer: Federal, State, Local not specified - PPO | Admitting: *Deleted

## 2016-03-03 DIAGNOSIS — Z952 Presence of prosthetic heart valve: Secondary | ICD-10-CM | POA: Diagnosis not present

## 2016-03-03 DIAGNOSIS — Q231 Congenital insufficiency of aortic valve: Secondary | ICD-10-CM

## 2016-03-03 LAB — POCT INR: INR: 3

## 2016-03-04 DIAGNOSIS — H2513 Age-related nuclear cataract, bilateral: Secondary | ICD-10-CM | POA: Diagnosis not present

## 2016-03-04 DIAGNOSIS — H52203 Unspecified astigmatism, bilateral: Secondary | ICD-10-CM | POA: Diagnosis not present

## 2016-03-04 DIAGNOSIS — E119 Type 2 diabetes mellitus without complications: Secondary | ICD-10-CM | POA: Diagnosis not present

## 2016-03-04 DIAGNOSIS — H5213 Myopia, bilateral: Secondary | ICD-10-CM | POA: Diagnosis not present

## 2016-03-04 DIAGNOSIS — H524 Presbyopia: Secondary | ICD-10-CM | POA: Diagnosis not present

## 2016-03-24 ENCOUNTER — Ambulatory Visit (INDEPENDENT_AMBULATORY_CARE_PROVIDER_SITE_OTHER): Payer: Federal, State, Local not specified - PPO | Admitting: *Deleted

## 2016-03-24 DIAGNOSIS — Q231 Congenital insufficiency of aortic valve: Secondary | ICD-10-CM | POA: Diagnosis not present

## 2016-03-24 DIAGNOSIS — Z952 Presence of prosthetic heart valve: Secondary | ICD-10-CM | POA: Diagnosis not present

## 2016-03-24 LAB — POCT INR: INR: 3

## 2016-04-11 ENCOUNTER — Other Ambulatory Visit: Payer: Self-pay | Admitting: Cardiovascular Disease

## 2016-04-19 ENCOUNTER — Ambulatory Visit (INDEPENDENT_AMBULATORY_CARE_PROVIDER_SITE_OTHER): Payer: Federal, State, Local not specified - PPO | Admitting: *Deleted

## 2016-04-19 DIAGNOSIS — Q231 Congenital insufficiency of aortic valve: Secondary | ICD-10-CM

## 2016-04-19 DIAGNOSIS — Z952 Presence of prosthetic heart valve: Secondary | ICD-10-CM | POA: Diagnosis not present

## 2016-04-19 LAB — POCT INR: INR: 3.3

## 2016-04-26 ENCOUNTER — Telehealth (HOSPITAL_COMMUNITY): Payer: Self-pay | Admitting: Cardiovascular Disease

## 2016-04-26 NOTE — Telephone Encounter (Signed)
Pt cancelled appt on 9/20 I called pt today and spoke with her and she voiced that no one could tell her what her co-pay would be for the test and until she could get that information , she would not be rescheduling. I informed her that with testing , no copay is usually required. She said that she did not believe that and therefore would not be rescheduling. She will be removed from the workqueue.

## 2016-05-12 ENCOUNTER — Ambulatory Visit (INDEPENDENT_AMBULATORY_CARE_PROVIDER_SITE_OTHER): Payer: Federal, State, Local not specified - PPO | Admitting: *Deleted

## 2016-05-12 DIAGNOSIS — Z952 Presence of prosthetic heart valve: Secondary | ICD-10-CM | POA: Diagnosis not present

## 2016-05-12 DIAGNOSIS — Q231 Congenital insufficiency of aortic valve: Secondary | ICD-10-CM | POA: Diagnosis not present

## 2016-05-12 LAB — POCT INR: INR: 3.3

## 2016-05-16 ENCOUNTER — Other Ambulatory Visit: Payer: Self-pay | Admitting: Cardiovascular Disease

## 2016-06-08 ENCOUNTER — Encounter (INDEPENDENT_AMBULATORY_CARE_PROVIDER_SITE_OTHER): Payer: Self-pay

## 2016-06-08 ENCOUNTER — Ambulatory Visit (INDEPENDENT_AMBULATORY_CARE_PROVIDER_SITE_OTHER): Payer: Federal, State, Local not specified - PPO | Admitting: *Deleted

## 2016-06-08 DIAGNOSIS — Q231 Congenital insufficiency of aortic valve: Secondary | ICD-10-CM | POA: Diagnosis not present

## 2016-06-08 DIAGNOSIS — Z952 Presence of prosthetic heart valve: Secondary | ICD-10-CM | POA: Diagnosis not present

## 2016-06-08 LAB — POCT INR: INR: 2.1

## 2016-07-09 ENCOUNTER — Ambulatory Visit (INDEPENDENT_AMBULATORY_CARE_PROVIDER_SITE_OTHER): Payer: Federal, State, Local not specified - PPO | Admitting: *Deleted

## 2016-07-09 ENCOUNTER — Encounter (INDEPENDENT_AMBULATORY_CARE_PROVIDER_SITE_OTHER): Payer: Self-pay

## 2016-07-09 DIAGNOSIS — Z952 Presence of prosthetic heart valve: Secondary | ICD-10-CM

## 2016-07-09 DIAGNOSIS — Q231 Congenital insufficiency of aortic valve: Secondary | ICD-10-CM | POA: Diagnosis not present

## 2016-07-09 LAB — POCT INR: INR: 2.5

## 2016-08-11 ENCOUNTER — Ambulatory Visit (INDEPENDENT_AMBULATORY_CARE_PROVIDER_SITE_OTHER): Payer: Federal, State, Local not specified - PPO | Admitting: *Deleted

## 2016-08-11 ENCOUNTER — Encounter (INDEPENDENT_AMBULATORY_CARE_PROVIDER_SITE_OTHER): Payer: Self-pay

## 2016-08-11 DIAGNOSIS — Q231 Congenital insufficiency of aortic valve: Secondary | ICD-10-CM | POA: Diagnosis not present

## 2016-08-11 DIAGNOSIS — Z952 Presence of prosthetic heart valve: Secondary | ICD-10-CM | POA: Diagnosis not present

## 2016-08-11 DIAGNOSIS — Q2381 Bicuspid aortic valve: Secondary | ICD-10-CM

## 2016-08-11 LAB — POCT INR: INR: 3.8

## 2016-08-17 ENCOUNTER — Other Ambulatory Visit: Payer: Self-pay | Admitting: *Deleted

## 2016-08-17 DIAGNOSIS — I712 Thoracic aortic aneurysm, without rupture, unspecified: Secondary | ICD-10-CM

## 2016-08-17 DIAGNOSIS — E785 Hyperlipidemia, unspecified: Secondary | ICD-10-CM | POA: Diagnosis not present

## 2016-08-17 DIAGNOSIS — L84 Corns and callosities: Secondary | ICD-10-CM | POA: Diagnosis not present

## 2016-08-17 DIAGNOSIS — R739 Hyperglycemia, unspecified: Secondary | ICD-10-CM | POA: Diagnosis not present

## 2016-08-17 DIAGNOSIS — Z79899 Other long term (current) drug therapy: Secondary | ICD-10-CM | POA: Diagnosis not present

## 2016-08-22 ENCOUNTER — Other Ambulatory Visit: Payer: Self-pay | Admitting: Cardiovascular Disease

## 2016-08-31 ENCOUNTER — Ambulatory Visit (INDEPENDENT_AMBULATORY_CARE_PROVIDER_SITE_OTHER): Payer: Federal, State, Local not specified - PPO

## 2016-08-31 DIAGNOSIS — Z952 Presence of prosthetic heart valve: Secondary | ICD-10-CM

## 2016-08-31 DIAGNOSIS — Q231 Congenital insufficiency of aortic valve: Secondary | ICD-10-CM

## 2016-08-31 LAB — POCT INR: INR: 2.9

## 2016-09-21 ENCOUNTER — Ambulatory Visit (INDEPENDENT_AMBULATORY_CARE_PROVIDER_SITE_OTHER): Payer: Federal, State, Local not specified - PPO | Admitting: *Deleted

## 2016-09-21 DIAGNOSIS — Q231 Congenital insufficiency of aortic valve: Secondary | ICD-10-CM | POA: Diagnosis not present

## 2016-09-21 LAB — POCT INR: INR: 2.8

## 2016-10-06 ENCOUNTER — Ambulatory Visit
Admission: RE | Admit: 2016-10-06 | Discharge: 2016-10-06 | Disposition: A | Discharge status: Home / Self Care | Payer: Federal, State, Local not specified - PPO | Source: Ambulatory Visit | Attending: Surgery | Admitting: Surgery

## 2016-10-06 ENCOUNTER — Ambulatory Visit (INDEPENDENT_AMBULATORY_CARE_PROVIDER_SITE_OTHER): Payer: Federal, State, Local not specified - PPO | Admitting: Surgery

## 2016-10-06 ENCOUNTER — Encounter: Payer: Self-pay | Admitting: Surgery

## 2016-10-06 VITALS — BP 136/74 | HR 74 | Resp 20 | Ht 65.0 in | Wt 161.0 lb

## 2016-10-06 DIAGNOSIS — I712 Thoracic aortic aneurysm, without rupture, unspecified: Secondary | ICD-10-CM

## 2016-10-06 DIAGNOSIS — I7121 Aneurysm of the ascending aorta, without rupture: Secondary | ICD-10-CM

## 2016-10-06 MED ORDER — GADOBENATE DIMEGLUMINE 529 MG/ML IV SOLN
14.0000 mL | Freq: Once | INTRAVENOUS | Status: AC | PRN
  Administered 2016-10-06: 14 mL via INTRAVENOUS

## 2016-10-06 NOTE — Progress Notes (Signed)
HPI:  The patient returns today for follow up of an ascending aortic aneurysm following AVR with a St. Jude mechanical valve in 1999 for severe bicuspid aortic stenosis. I have been following her ascending aorta since 2010 and it has been stable with measurements between 4.6-4.8 cm. I last saw her a year ago and it was measured at 4.7 cm. She continues to do well with no symptoms of any kind.   Current Outpatient Prescriptions  Medication Sig Dispense Refill  . aspirin 81 MG EC tablet Take 81 mg by mouth daily.      . carvedilol (COREG) 3.125 MG tablet Take 1 tablet (3.125 mg total) by mouth 2 (two) times daily. 60 tablet 11  . Cholecalciferol (VITAMIN D) 1000 UNITS capsule Take 1,000 Units by mouth daily.      Marland Kitchen co-enzyme Q-10 30 MG capsule Take 30 mg by mouth daily.     . fluvastatin (LESCOL) 40 MG capsule Take 1 capsule (40 mg total) by mouth at bedtime. 30 capsule 11  . metFORMIN (GLUCOPHAGE) 500 MG tablet Take 500 mg by mouth 2 (two) times daily with a meal.    . warfarin (COUMADIN) 10 MG tablet TAKE AS DIRECTED BY COUMADIN CLINIC. 30 tablet 3  . warfarin (COUMADIN) 7.5 MG tablet TAKE AS DIRECTED BY COUMADIN CLINIC. 40 tablet 1   No current facility-administered medications for this visit.      Physical Exam: BP 136/74   Pulse 74   Resp 20   Ht 5\' 5"  (1.651 m)   Wt 73 kg (161 lb)   SpO2 97% Comment: RA  BMI 26.79 kg/m  She looks well Cardiac exam shows a regular rate and rhythm with normal heart sounds and no murmur Lungs are clear  Diagnostic Tests:  CLINICAL DATA:  Follow-up aortic aneurysm. History of aortic valve replacement.  EXAM: MRA CHEST WITH CONTRAST  TECHNIQUE: Angiographic images of the chest were obtained using MRA technique 14 intravenous contrast.  CONTRAST:  23mL MULTIHANCE GADOBENATE DIMEGLUMINE 529 MG/ML IV SOLN  Creatinine was obtained on site at [facility] at [address]  Results: Creatinine 0.6  mg/dL.  COMPARISON:  Chest MRA-10/01/2015 ; 08/19/2014; 08/11/2009  FINDINGS: Vascular Findings:  Grossly unchanged mild fusiform aneurysmal dilatation of the ascending thoracic aorta with measurements as follows, similar to the 08/2009 examination. No evidence of thoracic aortic dissection or periaortic stranding on this nongated examination.  Susceptibility artifact is noted associated with prior median sternotomy and at the level of the aortic root, as a sequela of provided history of aortic valve repair.  The thoracic aorta tapers to a normal caliber at the level of the aortic arch. Note is made of a small ductus diverticulum, unchanged. The descending thoracic aorta is of normal caliber.  Conventional configuration of the aortic arch. The branch vessels of the aortic arch appear widely patent throughout their imaged course.  Normal heart size.  No pericardial effusion.  Although this examination was not tailored for the evaluation the pulmonary arteries, there are no discrete filling defects within the central pulmonary arterial tree to suggest central pulmonary embolism. Normal caliber of the main pulmonary artery.  -------------------------------------------------------------  Thoracic aortic measurements:  Sinotubular junction  33 mm as measured in greatest oblique coronal dimension.  Proximal ascending aorta  46 mm as measured in greatest oblique axial dimension at the level of the main pulmonary artery (image 12, series 3) an approximately 46 mm in greatest oblique sagittal diameter (image 53,  series 10), unchanged compared to the 08/2009 examination.  Aortic arch aorta  25 mm as measured in greatest oblique sagittal dimension.  Proximal descending thoracic aorta  21 mm as measured in greatest oblique axial dimension at the level of the main pulmonary artery (image 12, series 3).  Distal descending thoracic aorta  19 mm as measured  in greatest oblique axial dimension at the level of the diaphragmatic hiatus (image 26, series 3).  Review of the MIP images confirms the above findings.  -------------------------------------------------------------  Non-Vascular Findings:  No focal airspace opacities. No bulky mediastinal, hilar axillary lymphadenopathy.  Limited evaluation the upper abdomen is normal.  Regional osseous structures and soft tissues appear normal.  IMPRESSION: 1. Stable uncomplicated fusiform aneurysmal dilatation of the ascending thoracic aorta measuring 46 mm in maximal diameter, grossly unchanged compared to the 08/2009 examination. 2. Stable sequela of prior median sternotomy and aortic valve replacement.   Electronically Signed   By: Sandi Mariscal M.D.   On: 10/06/2016 09:23  Impression:  The ascending aortic aneurysm is stable at 4.6 cm. I reviewed the MRA images with her and answered her questions. I stressed the importance of good BP control. She is on a beta blocker. She has a normal functioning mechanical aortic valve and her INR was 2.8 on 09/21/2016. There is no indication for surgical treatment of her aneurysm but it does require continued follow up.  Plan:  I will see her back in one year for an MRA of the chest.   I spent 15 minutes performing this established office evaluation and > 50% of this time was spent face to face counseling and coordinating the care of this patient's ascending aortic aneurysm.   Gaye Pollack, MD [facility] [phone]

## 2016-10-19 ENCOUNTER — Ambulatory Visit (INDEPENDENT_AMBULATORY_CARE_PROVIDER_SITE_OTHER): Payer: Federal, State, Local not specified - PPO

## 2016-10-19 DIAGNOSIS — Q231 Congenital insufficiency of aortic valve: Secondary | ICD-10-CM | POA: Diagnosis not present

## 2016-10-19 LAB — POCT INR: INR: 3.7

## 2016-11-02 ENCOUNTER — Ambulatory Visit (INDEPENDENT_AMBULATORY_CARE_PROVIDER_SITE_OTHER): Payer: Federal, State, Local not specified - PPO | Admitting: *Deleted

## 2016-11-02 DIAGNOSIS — Q231 Congenital insufficiency of aortic valve: Secondary | ICD-10-CM

## 2016-11-02 LAB — POCT INR: INR: 2.4

## 2016-11-11 ENCOUNTER — Other Ambulatory Visit: Payer: Self-pay | Admitting: Cardiovascular Disease

## 2016-11-15 DIAGNOSIS — M25551 Pain in right hip: Secondary | ICD-10-CM | POA: Diagnosis not present

## 2016-11-20 DIAGNOSIS — M25551 Pain in right hip: Secondary | ICD-10-CM | POA: Diagnosis not present

## 2016-11-24 ENCOUNTER — Ambulatory Visit (INDEPENDENT_AMBULATORY_CARE_PROVIDER_SITE_OTHER): Payer: Federal, State, Local not specified - PPO | Admitting: *Deleted

## 2016-11-24 DIAGNOSIS — Q231 Congenital insufficiency of aortic valve: Secondary | ICD-10-CM

## 2016-11-24 LAB — POCT INR: INR: 2.4

## 2016-11-26 DIAGNOSIS — M25551 Pain in right hip: Secondary | ICD-10-CM | POA: Diagnosis not present

## 2016-12-22 ENCOUNTER — Ambulatory Visit (INDEPENDENT_AMBULATORY_CARE_PROVIDER_SITE_OTHER): Payer: Federal, State, Local not specified - PPO | Admitting: *Deleted

## 2016-12-22 ENCOUNTER — Encounter (INDEPENDENT_AMBULATORY_CARE_PROVIDER_SITE_OTHER): Payer: Self-pay

## 2016-12-22 DIAGNOSIS — Q231 Congenital insufficiency of aortic valve: Secondary | ICD-10-CM | POA: Diagnosis not present

## 2016-12-22 LAB — POCT INR: INR: 2.8

## 2017-01-04 ENCOUNTER — Encounter: Payer: Self-pay | Admitting: *Deleted

## 2017-01-24 ENCOUNTER — Encounter: Payer: Self-pay | Admitting: Cardiovascular Disease

## 2017-01-24 ENCOUNTER — Ambulatory Visit (INDEPENDENT_AMBULATORY_CARE_PROVIDER_SITE_OTHER): Payer: Federal, State, Local not specified - PPO | Admitting: Cardiovascular Disease

## 2017-01-24 ENCOUNTER — Ambulatory Visit (INDEPENDENT_AMBULATORY_CARE_PROVIDER_SITE_OTHER): Payer: Federal, State, Local not specified - PPO | Admitting: *Deleted

## 2017-01-24 VITALS — BP 120/70 | HR 66 | Ht 65.0 in | Wt 158.8 lb

## 2017-01-24 DIAGNOSIS — I10 Essential (primary) hypertension: Secondary | ICD-10-CM | POA: Diagnosis not present

## 2017-01-24 DIAGNOSIS — Q2381 Bicuspid aortic valve: Secondary | ICD-10-CM

## 2017-01-24 DIAGNOSIS — Q231 Congenital insufficiency of aortic valve: Secondary | ICD-10-CM | POA: Diagnosis not present

## 2017-01-24 DIAGNOSIS — I712 Thoracic aortic aneurysm, without rupture, unspecified: Secondary | ICD-10-CM

## 2017-01-24 LAB — POCT INR: INR: 2.6

## 2017-01-24 NOTE — Progress Notes (Signed)
Cardiology Office Note Date:  01/24/2017   ID:  MAKYNA Key, DOB 09-23-1955, MRN [id number]  PCP:  Josetta Huddle, MD  Cardiologist:  Sherren Mocha, MD    Chief Complaint  Patient presents with  . Follow-up    1 year Aortic Valve Disease     History of Present Illness: Monica Key is a 61 y.o. female who presents for follow-up of aortic valve disease. The patient underwent mechanical aortic valve replacement in 1999 and she has been followed for a dilated ascending aorta with serial imaging.  She was last seen by Dr. Cyndia Bent 10/06/2016 and an MRA of the chest demonstrated stable dimensions of an ascending aortic aneurysm at 4.6 cm maximal dimension.  She is here alone today. Today, she denies symptoms of palpitations, chest pain, shortness of breath, orthopnea, PND, lower extremity edema, dizziness, or syncope.  Home blood pressures are very good. We reviewed readings today. INR's have been stable. No bleeding problems reported.   She's here alone today. Feels well. No chest pain, shortness of breath, or heart palpitations. She had a leg injury in the spring and wasn't able to exercise through the first part of summer. She's now back to her normal exercise regimen with no exertional symptoms.  When I saw her last year I recommended an echocardiogram for surveillance of her mechanical aortic valve prosthesis. She declined because of cost. She would like to wait until she 61 years old and covered by Medicare.  Past Medical History:  Diagnosis Date  . Diabetes mellitus without complication ([facility])   . Hypertension   . Pinched nerve in neck     Past Surgical History:  Procedure Laterality Date  . CARDIAC SURGERY      Current Outpatient Prescriptions  Medication Sig Dispense Refill  . aspirin 81 MG EC tablet Take 81 mg by mouth daily.      . carvedilol (COREG) 3.125 MG tablet Take 1 tablet (3.125 mg total) by mouth 2 (two) times daily. 60 tablet 11  . Cholecalciferol (VITAMIN  D) 1000 UNITS capsule Take 1,000 Units by mouth daily.      Marland Kitchen co-enzyme Q-10 30 MG capsule Take 30 mg by mouth daily.     . fluvastatin (LESCOL) 40 MG capsule Take 1 capsule (40 mg total) by mouth at bedtime. 30 capsule 11  . metFORMIN (GLUCOPHAGE) 500 MG tablet Take 500 mg by mouth 2 (two) times daily with a meal.    . warfarin (COUMADIN) 10 MG tablet TAKE AS DIRECTED BY COUMADIN CLINIC. 40 tablet 1  . warfarin (COUMADIN) 7.5 MG tablet TAKE AS DIRECTED BY COUMADIN CLINIC. 40 tablet 1   No current facility-administered medications for this visit.     Allergies:   Hydralazine hcl; Penicillins; Sulfonamide derivatives; and Vancomycin   Social History:  The patient  reports that she has never smoked. She has never used smokeless tobacco. She reports that she does not drink alcohol or use drugs.   Family History:  The patient's  family history includes Alzheimer's disease in her mother; Angina in her mother; Transient ischemic attack in her father.    ROS:  Please see the history of present illness.   All other systems are reviewed and negative.    PHYSICAL EXAM: VS:  BP 120/70   Pulse 66   Ht 5\' 5"  (1.651 m)   Wt 72 kg (158 lb 12.8 oz)   BMI 26.43 kg/m  , BMI Body mass index is 26.43 kg/m. GEN: Well  nourished, well developed, in no acute distress  HEENT: normal  Neck: no JVD, no masses. No carotid bruits Cardiac: RRR with 2/6 systolic murmur at the RUSB, no diastolic murmur, normal mechanical A2                Respiratory:  clear to auscultation bilaterally, normal work of breathing GI: soft, nontender, nondistended, + BS MS: no deformity or atrophy  Ext: no pretibial edema, pedal pulses 2+= bilaterally Skin: warm and dry, no rash Neuro:  Strength and sensation are intact Psych: euthymic mood, full affect  EKG:  EKG is ordered today. The ekg ordered today shows normal sinus rhythm 66 bpm, nonspecific ST/T-wave abnormality.  Recent Labs: 02/04/2016: Hemoglobin 13.5; Platelets  210   Lipid Panel     Component Value Date/Time   CHOL 169 01/16/2016 0835   CHOL 133 12/12/2012 0743   TRIG 105 01/16/2016 0835   TRIG 96 12/12/2012 0743   HDL 46 01/16/2016 0835   HDL 45 12/12/2012 0743   CHOLHDL 3.7 01/16/2016 0835   VLDL 21 01/16/2016 0835   LDLCALC 102 01/16/2016 0835   LDLCALC 69 12/12/2012 0743   LDLDIRECT 151.7 06/21/2006 0902      Wt Readings from Last 3 Encounters:  01/24/17 72 kg (158 lb 12.8 oz)  10/06/16 73 kg (161 lb)  01/16/16 69.3 kg (152 lb 12.8 oz)     ASSESSMENT AND PLAN: 1.  Aortic valve disorder status post mechanical aortic valve replacement: The patient is doing well. Her exam suggests normal function of her mechanical aortic bioprosthesis. Will continue to follow her clinically and tentatively anticipate check an echocardiogram when she is 61 years old. If she has problems in the interim will check one sooner.  2. Chronic anticoagulation: Tolerating warfarin and low-dose aspirin without bleeding problems. INRs have been therapeutic.  3. Ascending aortic aneurysm: Followed closely by Dr. Cyndia Bent. Last MRA reviewed. Aneurysm is stable at 4.6 cm.  4. Hyperlipidemia: Last lipids reviewed from October 2017. She continues on a statin drug. Followed by her primary physician.  Current medicines are reviewed with the patient today.  The patient does not have concerns regarding medicines.  Labs/ tests ordered today include:  No orders of the defined types were placed in this encounter.   Disposition:   FU one year  Signed, Sherren Mocha, MD  01/24/2017 12:16 PM    [facility] [address], [place], [place]  [address] Phone: [phone]; Fax: [phone]

## 2017-01-24 NOTE — Patient Instructions (Signed)

## 2017-02-06 ENCOUNTER — Other Ambulatory Visit: Payer: Self-pay | Admitting: Cardiovascular Disease

## 2017-02-07 ENCOUNTER — Other Ambulatory Visit: Payer: Self-pay | Admitting: Cardiovascular Disease

## 2017-02-23 DIAGNOSIS — L500 Allergic urticaria: Secondary | ICD-10-CM | POA: Diagnosis not present

## 2017-02-23 DIAGNOSIS — Z0000 Encounter for general adult medical examination without abnormal findings: Secondary | ICD-10-CM | POA: Diagnosis not present

## 2017-02-23 DIAGNOSIS — Z79899 Other long term (current) drug therapy: Secondary | ICD-10-CM | POA: Diagnosis not present

## 2017-02-23 DIAGNOSIS — R011 Cardiac murmur, unspecified: Secondary | ICD-10-CM | POA: Diagnosis not present

## 2017-02-23 DIAGNOSIS — Z23 Encounter for immunization: Secondary | ICD-10-CM | POA: Diagnosis not present

## 2017-02-23 DIAGNOSIS — M79659 Pain in unspecified thigh: Secondary | ICD-10-CM | POA: Diagnosis not present

## 2017-02-23 DIAGNOSIS — R739 Hyperglycemia, unspecified: Secondary | ICD-10-CM | POA: Diagnosis not present

## 2017-02-23 DIAGNOSIS — E559 Vitamin D deficiency, unspecified: Secondary | ICD-10-CM | POA: Diagnosis not present

## 2017-02-23 DIAGNOSIS — E785 Hyperlipidemia, unspecified: Secondary | ICD-10-CM | POA: Diagnosis not present

## 2017-03-07 ENCOUNTER — Ambulatory Visit (INDEPENDENT_AMBULATORY_CARE_PROVIDER_SITE_OTHER): Payer: Federal, State, Local not specified - PPO | Admitting: *Deleted

## 2017-03-07 ENCOUNTER — Encounter (INDEPENDENT_AMBULATORY_CARE_PROVIDER_SITE_OTHER): Payer: Self-pay

## 2017-03-07 DIAGNOSIS — Z5181 Encounter for therapeutic drug level monitoring: Secondary | ICD-10-CM

## 2017-03-07 DIAGNOSIS — Q2381 Bicuspid aortic valve: Secondary | ICD-10-CM

## 2017-03-07 DIAGNOSIS — Q231 Congenital insufficiency of aortic valve: Secondary | ICD-10-CM | POA: Diagnosis not present

## 2017-03-07 LAB — POCT INR: INR: 2.4

## 2017-04-11 DIAGNOSIS — M8588 Other specified disorders of bone density and structure, other site: Secondary | ICD-10-CM | POA: Diagnosis not present

## 2017-04-11 DIAGNOSIS — Z13820 Encounter for screening for osteoporosis: Secondary | ICD-10-CM | POA: Diagnosis not present

## 2017-04-11 DIAGNOSIS — Z780 Asymptomatic menopausal state: Secondary | ICD-10-CM | POA: Diagnosis not present

## 2017-04-27 ENCOUNTER — Ambulatory Visit (INDEPENDENT_AMBULATORY_CARE_PROVIDER_SITE_OTHER): Payer: Federal, State, Local not specified - PPO | Admitting: Pharmacist

## 2017-04-27 DIAGNOSIS — Q231 Congenital insufficiency of aortic valve: Secondary | ICD-10-CM

## 2017-04-27 DIAGNOSIS — Z5181 Encounter for therapeutic drug level monitoring: Secondary | ICD-10-CM | POA: Insufficient documentation

## 2017-04-27 LAB — POCT INR: INR: 3.1

## 2017-04-27 NOTE — Patient Instructions (Signed)
Description   Take only 7.5mg  tomorrow (since already took dose today) and then continue taking 7.5mg  daily except 10mg  on Tuesdays, Thursdays and Saturdays.   Recheck INR in 6 weeks.

## 2017-05-08 ENCOUNTER — Other Ambulatory Visit: Payer: Self-pay | Admitting: Cardiovascular Disease

## 2017-06-08 ENCOUNTER — Ambulatory Visit: Payer: Federal, State, Local not specified - PPO | Admitting: *Deleted

## 2017-06-08 DIAGNOSIS — Z5181 Encounter for therapeutic drug level monitoring: Secondary | ICD-10-CM | POA: Diagnosis not present

## 2017-06-08 DIAGNOSIS — Q231 Congenital insufficiency of aortic valve: Secondary | ICD-10-CM

## 2017-06-08 LAB — POCT INR: INR: 2.6

## 2017-06-08 NOTE — Patient Instructions (Signed)
Description   Continue taking 7.5mg daily except 10mg on Tuesdays, Thursdays and Saturdays.   Recheck INR in 6 weeks.      

## 2017-07-20 ENCOUNTER — Ambulatory Visit: Payer: Federal, State, Local not specified - PPO | Admitting: *Deleted

## 2017-07-20 DIAGNOSIS — Z5181 Encounter for therapeutic drug level monitoring: Secondary | ICD-10-CM

## 2017-07-20 DIAGNOSIS — Q231 Congenital insufficiency of aortic valve: Secondary | ICD-10-CM

## 2017-07-20 LAB — POCT INR: INR: 2.4

## 2017-07-20 NOTE — Patient Instructions (Signed)
Description   Continue taking 7.5mg daily except 10mg on Tuesdays, Thursdays and Saturdays.   Recheck INR in 6 weeks.      

## 2017-07-21 DIAGNOSIS — R0681 Apnea, not elsewhere classified: Secondary | ICD-10-CM | POA: Diagnosis not present

## 2017-08-07 ENCOUNTER — Other Ambulatory Visit: Payer: Self-pay | Admitting: Cardiovascular Disease

## 2017-08-22 DIAGNOSIS — G4733 Obstructive sleep apnea (adult) (pediatric): Secondary | ICD-10-CM | POA: Diagnosis not present

## 2017-08-29 ENCOUNTER — Other Ambulatory Visit: Payer: Self-pay | Admitting: Cardiovascular Disease

## 2017-08-30 ENCOUNTER — Other Ambulatory Visit: Payer: Self-pay | Admitting: Surgery

## 2017-08-30 DIAGNOSIS — I712 Thoracic aortic aneurysm, without rupture, unspecified: Secondary | ICD-10-CM

## 2017-08-31 ENCOUNTER — Ambulatory Visit: Payer: Federal, State, Local not specified - PPO | Admitting: *Deleted

## 2017-08-31 ENCOUNTER — Encounter (INDEPENDENT_AMBULATORY_CARE_PROVIDER_SITE_OTHER): Payer: Self-pay

## 2017-08-31 DIAGNOSIS — Z5181 Encounter for therapeutic drug level monitoring: Secondary | ICD-10-CM | POA: Diagnosis not present

## 2017-08-31 DIAGNOSIS — Q231 Congenital insufficiency of aortic valve: Secondary | ICD-10-CM

## 2017-08-31 LAB — POCT INR: INR: 2.3

## 2017-08-31 NOTE — Patient Instructions (Signed)
Description   Continue taking 7.5mg daily except 10mg on Tuesdays, Thursdays and Saturdays.   Recheck INR in 6 weeks.      

## 2017-09-04 ENCOUNTER — Other Ambulatory Visit: Payer: Self-pay | Admitting: Cardiovascular Disease

## 2017-09-16 DIAGNOSIS — G4733 Obstructive sleep apnea (adult) (pediatric): Secondary | ICD-10-CM | POA: Diagnosis not present

## 2017-09-26 DIAGNOSIS — G4733 Obstructive sleep apnea (adult) (pediatric): Secondary | ICD-10-CM | POA: Diagnosis not present

## 2017-10-05 ENCOUNTER — Other Ambulatory Visit: Payer: Self-pay

## 2017-10-05 ENCOUNTER — Ambulatory Visit
Admission: RE | Admit: 2017-10-05 | Discharge: 2017-10-05 | Disposition: A | Discharge status: Home / Self Care | Payer: Federal, State, Local not specified - PPO | Source: Ambulatory Visit | Attending: Surgery | Admitting: Surgery

## 2017-10-05 ENCOUNTER — Encounter: Payer: Self-pay | Admitting: Surgery

## 2017-10-05 ENCOUNTER — Ambulatory Visit: Payer: Federal, State, Local not specified - PPO | Admitting: Surgery

## 2017-10-05 VITALS — BP 138/83 | HR 70 | Resp 18 | Ht 65.0 in | Wt 172.2 lb

## 2017-10-05 DIAGNOSIS — I712 Thoracic aortic aneurysm, without rupture, unspecified: Secondary | ICD-10-CM

## 2017-10-05 MED ORDER — GADOBENATE DIMEGLUMINE 529 MG/ML IV SOLN
16.0000 mL | Freq: Once | INTRAVENOUS | Status: AC | PRN
  Administered 2017-10-05: 16 mL via INTRAVENOUS

## 2017-10-05 NOTE — Progress Notes (Signed)
HPI:  The patient returns today for follow up of an ascending aortic aneurysm following AVR with a [employer] mechanical valve in 1999 for severe bicuspid aortic stenosis. I have been following her ascending aorta since 2010 and it has been stable with measurements between 4.6-4.8 cm. I last saw her a year ago and it was measured at 4.6 cm. She continues to do well with no symptoms of any kind.  Her only concern today is that her blood pressure may be a little higher than she would like.    Current Outpatient Medications  Medication Sig Dispense Refill  . aspirin 81 MG EC tablet Take 81 mg by mouth daily.      . carvedilol (COREG) 3.125 MG tablet Take 1 tablet (3.125 mg total) by mouth 2 (two) times daily. 60 tablet 11  . Cholecalciferol (VITAMIN D) 1000 UNITS capsule Take 1,000 Units by mouth daily.      Marland Kitchen co-enzyme Q-10 30 MG capsule Take 30 mg by mouth daily.     . fluvastatin (LESCOL) 40 MG capsule TAKE 1 CAPSULE AT BEDTIME. 30 capsule 6  . metFORMIN (GLUCOPHAGE) 500 MG tablet Take 500 mg by mouth 2 (two) times daily with a meal.    . warfarin (COUMADIN) 10 MG tablet TAKE AS DIRECTED BY COUMADIN CLINIC. 40 tablet 0  . warfarin (COUMADIN) 7.5 MG tablet TAKE AS DIRECTED BY COUMADIN CLINIC. 60 tablet 0   No current facility-administered medications for this visit.      Physical Exam: BP 138/83 (BP Location: Left Arm, Patient Position: Sitting, Cuff Size: Normal)   Pulse 70   Resp 18   Ht 5\' 5"  (1.651 m)   Wt 172 lb 3.2 oz (78.1 kg)   SpO2 95% Comment: RA  BMI 28.66 kg/m  She looks well. Cardiac exam shows a regular rate and rhythm with crisp mechanical valve click.  There is no murmur. Lungs are clear. There is no peripheral edema.  Diagnostic Tests:  CLINICAL DATA:  Ascending thoracic aortic aneurysm  EXAM: MRA CHEST WITH CONTRAST  TECHNIQUE: Multiplanar, multiecho pulse sequences of the chest were obtained with intravenous contrast. Angiographic images of chest  were obtained using MRA technique with intravenous contrast.  CONTRAST:  22mL MULTIHANCE GADOBENATE DIMEGLUMINE 529 MG/ML IV SOLN  COMPARISON:  10/06/2016  FINDINGS: Maximal diameter of the ascending aorta at the sinus of Valsalva, sino-tubular junction, and ascending aorta are 3.5 cm, 3.3 cm, and 4.6 cm. Previously, maximal diameter of the ascending aorta was measured at 4.6 cm, therefore this is stable. Great vessels are patent. Vertebral arteries are patent within the confines of the examination. There is no obvious acute pulmonary thromboembolism. There is no evidence of acute intramural hematoma. There is no evidence of aortic dissection. Magnetic artifact from artificial aortic valve is noted.  There is no evidence of mediastinal mass effect. Lungs are grossly clear. A small cyst in the left lobe of the liver is stable.  IMPRESSION: Stable aneurysmal dilatation of the ascending aorta at 4.6 cm. Ascending thoracic aortic aneurysm. Recommend semi-annual imaging followup by CTA or MRA and referral to cardiothoracic surgery if not already obtained. This recommendation follows 2010 ACCF/AHA/AATS/ACR/ASA/SCA/SCAI/SIR/STS/SVM Guidelines for the Diagnosis and Management of Patients With Thoracic Aortic Disease. Circulation. 2010; 121: Z601-U932   Electronically Signed   By: Marybelle Killings M.D.   On: 10/05/2017 12:32   Impression:  She has stable aneurysmal dilation of the ascending aorta at 4.6 cm.  I think her blood  pressure is under good control on low-dose Coreg.  I asked her to check her blood pressure periodically at home at different times during the day and to make a log book.  Them she comes back to see Dr. Burt Knack he will have a better idea of where her blood pressure usually runs most of the time.  I reviewed the MRA images with her and answered all of her questions.  Plan:  She will continue to follow-up with Dr. Burt Knack and I will plan to see her back in 1 year  with a repeat MRA of the chest.    I spent 15 minutes performing this established patient evaluation and > 50% of this time was spent face to face counseling and coordinating the care of this patient's aortic aneurysm.     Gaye Pollack, MD [facility] [phone]

## 2017-10-07 DIAGNOSIS — H25012 Cortical age-related cataract, left eye: Secondary | ICD-10-CM | POA: Diagnosis not present

## 2017-10-07 DIAGNOSIS — E119 Type 2 diabetes mellitus without complications: Secondary | ICD-10-CM | POA: Diagnosis not present

## 2017-10-07 DIAGNOSIS — H2513 Age-related nuclear cataract, bilateral: Secondary | ICD-10-CM | POA: Diagnosis not present

## 2017-10-07 DIAGNOSIS — H35363 Drusen (degenerative) of macula, bilateral: Secondary | ICD-10-CM | POA: Diagnosis not present

## 2017-10-12 ENCOUNTER — Ambulatory Visit: Payer: Federal, State, Local not specified - PPO | Admitting: *Deleted

## 2017-10-12 DIAGNOSIS — Q231 Congenital insufficiency of aortic valve: Secondary | ICD-10-CM

## 2017-10-12 DIAGNOSIS — Z5181 Encounter for therapeutic drug level monitoring: Secondary | ICD-10-CM

## 2017-10-12 LAB — POCT INR: INR: 3.4 — AB (ref 2.0–3.0)

## 2017-10-12 NOTE — Patient Instructions (Signed)
Description   Do not take coumadin tomorrow June 6th as she has already taken coumadin today then continue taking 7.5mg  daily except 10mg  on Tuesdays, Thursdays and Saturdays.   Recheck INR in 2 weeks.

## 2017-10-31 ENCOUNTER — Ambulatory Visit: Payer: Federal, State, Local not specified - PPO | Admitting: *Deleted

## 2017-10-31 DIAGNOSIS — Z5181 Encounter for therapeutic drug level monitoring: Secondary | ICD-10-CM

## 2017-10-31 DIAGNOSIS — Q231 Congenital insufficiency of aortic valve: Secondary | ICD-10-CM | POA: Diagnosis not present

## 2017-10-31 LAB — POCT INR: INR: 2.3 (ref 2.0–3.0)

## 2017-10-31 NOTE — Patient Instructions (Signed)
Description   Continue taking 7.5mg  daily except 10mg  on Tuesdays, Thursdays and Saturdays.   Recheck INR in 4 weeks.

## 2017-11-13 ENCOUNTER — Other Ambulatory Visit: Payer: Self-pay | Admitting: Cardiovascular Disease

## 2017-11-21 DIAGNOSIS — G4733 Obstructive sleep apnea (adult) (pediatric): Secondary | ICD-10-CM | POA: Diagnosis not present

## 2017-11-28 DIAGNOSIS — K080 Exfoliation of teeth due to systemic causes: Secondary | ICD-10-CM | POA: Diagnosis not present

## 2017-11-30 ENCOUNTER — Ambulatory Visit: Payer: Federal, State, Local not specified - PPO | Admitting: Pharmacist

## 2017-11-30 DIAGNOSIS — Q231 Congenital insufficiency of aortic valve: Secondary | ICD-10-CM

## 2017-11-30 DIAGNOSIS — Z5181 Encounter for therapeutic drug level monitoring: Secondary | ICD-10-CM | POA: Diagnosis not present

## 2017-11-30 LAB — POCT INR: INR: 2.5 (ref 2.0–3.0)

## 2017-12-14 ENCOUNTER — Other Ambulatory Visit: Payer: Self-pay | Admitting: Cardiovascular Disease

## 2018-01-04 ENCOUNTER — Ambulatory Visit: Payer: Federal, State, Local not specified - PPO | Admitting: *Deleted

## 2018-01-04 DIAGNOSIS — L570 Actinic keratosis: Secondary | ICD-10-CM | POA: Diagnosis not present

## 2018-01-04 DIAGNOSIS — Q2381 Bicuspid aortic valve: Secondary | ICD-10-CM

## 2018-01-04 DIAGNOSIS — Z5181 Encounter for therapeutic drug level monitoring: Secondary | ICD-10-CM | POA: Diagnosis not present

## 2018-01-04 DIAGNOSIS — Q231 Congenital insufficiency of aortic valve: Secondary | ICD-10-CM

## 2018-01-04 LAB — POCT INR: INR: 3 (ref 2.0–3.0)

## 2018-01-04 NOTE — Patient Instructions (Signed)
Description   Continue taking 7.5mg daily except 10mg on Tuesdays, Thursdays and Saturdays.   Recheck INR in 6 weeks.      

## 2018-02-05 ENCOUNTER — Other Ambulatory Visit: Payer: Self-pay | Admitting: Cardiovascular Disease

## 2018-02-06 ENCOUNTER — Encounter

## 2018-02-15 ENCOUNTER — Ambulatory Visit: Payer: Federal, State, Local not specified - PPO | Admitting: *Deleted

## 2018-02-15 ENCOUNTER — Encounter (INDEPENDENT_AMBULATORY_CARE_PROVIDER_SITE_OTHER): Payer: Self-pay

## 2018-02-15 DIAGNOSIS — Z5181 Encounter for therapeutic drug level monitoring: Secondary | ICD-10-CM

## 2018-02-15 DIAGNOSIS — Q2381 Bicuspid aortic valve: Secondary | ICD-10-CM

## 2018-02-15 DIAGNOSIS — Q231 Congenital insufficiency of aortic valve: Secondary | ICD-10-CM

## 2018-02-15 LAB — POCT INR: INR: 2.9 (ref 2.0–3.0)

## 2018-02-15 NOTE — Patient Instructions (Signed)
Description   Continue taking 7.5mg daily except 10mg on Tuesdays, Thursdays and Saturdays.   Recheck INR in 6 weeks.      

## 2018-03-05 ENCOUNTER — Other Ambulatory Visit: Payer: Self-pay | Admitting: Cardiovascular Disease

## 2018-03-08 ENCOUNTER — Other Ambulatory Visit: Payer: Self-pay | Admitting: Internal Medicine

## 2018-03-08 DIAGNOSIS — Z954 Presence of other heart-valve replacement: Secondary | ICD-10-CM | POA: Diagnosis not present

## 2018-03-08 DIAGNOSIS — R739 Hyperglycemia, unspecified: Secondary | ICD-10-CM | POA: Diagnosis not present

## 2018-03-08 DIAGNOSIS — Z0000 Encounter for general adult medical examination without abnormal findings: Secondary | ICD-10-CM | POA: Diagnosis not present

## 2018-03-08 DIAGNOSIS — Z1231 Encounter for screening mammogram for malignant neoplasm of breast: Secondary | ICD-10-CM

## 2018-03-08 DIAGNOSIS — I712 Thoracic aortic aneurysm, without rupture: Secondary | ICD-10-CM | POA: Diagnosis not present

## 2018-03-08 DIAGNOSIS — Z23 Encounter for immunization: Secondary | ICD-10-CM | POA: Diagnosis not present

## 2018-03-08 DIAGNOSIS — E559 Vitamin D deficiency, unspecified: Secondary | ICD-10-CM | POA: Diagnosis not present

## 2018-03-08 DIAGNOSIS — E785 Hyperlipidemia, unspecified: Secondary | ICD-10-CM | POA: Diagnosis not present

## 2018-03-13 ENCOUNTER — Encounter: Payer: Self-pay | Admitting: Cardiovascular Disease

## 2018-03-13 ENCOUNTER — Ambulatory Visit: Payer: Federal, State, Local not specified - PPO | Admitting: Cardiovascular Disease

## 2018-03-13 VITALS — BP 112/70 | HR 68 | Ht 65.0 in | Wt 163.8 lb

## 2018-03-13 DIAGNOSIS — I359 Nonrheumatic aortic valve disorder, unspecified: Secondary | ICD-10-CM | POA: Diagnosis not present

## 2018-03-13 DIAGNOSIS — I10 Essential (primary) hypertension: Secondary | ICD-10-CM | POA: Diagnosis not present

## 2018-03-13 DIAGNOSIS — E782 Mixed hyperlipidemia: Secondary | ICD-10-CM | POA: Diagnosis not present

## 2018-03-13 DIAGNOSIS — Z952 Presence of prosthetic heart valve: Secondary | ICD-10-CM

## 2018-03-13 MED ORDER — CARVEDILOL 6.25 MG PO TABS: 6.2500 mg | ORAL_TABLET | Freq: Two times a day (BID) | ORAL | 3 refills | Status: DC

## 2018-03-13 NOTE — Patient Instructions (Signed)
Medication Instructions:  1) INCREASE COREG to 6.25 mg twice daily  Labwork: None  Testing/Procedures: Your provider has requested that you have an echocardiogram in 1 year. Echocardiography is a painless test that uses sound waves to create images of your heart. It provides your doctor with information about the size and shape of your heart and how well your heart's chambers and valves are working. This procedure takes approximately one hour. There are no restrictions for this procedure.  Follow-Up: You will be called to arrange your ultrasound and visit with Dr. Burt Knack in 1 year.  Any Other Special Instructions Will Be Listed Below (If Applicable).     If you need a refill on your cardiac medications before your next appointment, please call your pharmacy.

## 2018-03-13 NOTE — Progress Notes (Signed)
Cardiology Office Note:    Date:  03/15/2018   ID:  Monica Key, DOB Feb 04, 1956, MRN [id number]  PCP:  Josetta Huddle, MD  Cardiologist:  Sherren Mocha, MD  Electrophysiologist:  None   Referring MD: Josetta Huddle, MD   Chief Complaint  Patient presents with  . Follow-up    S/P AVR    History of Present Illness:    Monica Key is a 62 y.o. female with a hx of mechanical aortic valve replacement in 1999.  Patient is also followed for an ascending aortic aneurysm with stability of her aneurysm demonstrated earlier this year at 4.6 cm.  She has undergone annual MRA studies.  The patient is here alone today.  She is doing well.  She continues to exercise regularly at the gym with no exertional symptoms.  Her blood pressure has been running in the 120s at home.  She has no lightheadedness, presyncope, shortness of breath, chest pain, or leg swelling.  She is tolerating warfarin and aspirin without bleeding problems.  Past Medical History:  Diagnosis Date  . Diabetes mellitus without complication ([place])   . Hypertension   . Pinched nerve in neck     Past Surgical History:  Procedure Laterality Date  . CARDIAC SURGERY      Current Medications: No outpatient medications have been marked as taking for the 03/13/18 encounter (Office Visit) with Sherren Mocha, MD.     Allergies:   Hydralazine hcl; Penicillins; Sulfonamide derivatives; and Vancomycin   Social History   Socioeconomic History  . Marital status: Married    Spouse name: Not on file  . Number of children: Not on file  . Years of education: Not on file  . Highest education level: Not on file  Occupational History  . Not on file  Social Needs  . Financial resource strain: Not on file  . Food insecurity:    Worry: Not on file    Inability: Not on file  . Transportation needs:    Medical: Not on file    Non-medical: Not on file  Tobacco Use  . Smoking status: Never Smoker  . Smokeless tobacco: Never Used    Substance and Sexual Activity  . Alcohol use: No  . Drug use: No  . Sexual activity: Not Currently  Lifestyle  . Physical activity:    Days per week: Not on file    Minutes per session: Not on file  . Stress: Not on file  Relationships  . Social connections:    Talks on phone: Not on file    Gets together: Not on file    Attends religious service: Not on file    Active member of club or organization: Not on file    Attends meetings of clubs or organizations: Not on file    Relationship status: Not on file  Other Topics Concern  . Not on file  Social History Narrative  . Not on file     Family History: The patient's family history includes Alzheimer's disease in her mother; Angina in her mother; Transient ischemic attack in her father.  ROS:   Please see the history of present illness.    All other systems reviewed and are negative.  EKGs/Labs/Other Studies Reviewed:    The following studies were reviewed today: MRA 2017/10/09: FINDINGS: Maximal diameter of the ascending aorta at the sinus of Valsalva, sino-tubular junction, and ascending aorta are 3.5 cm, 3.3 cm, and 4.6 cm. Previously, maximal diameter of the ascending aorta  was measured at 4.6 cm, therefore this is stable. Great vessels are patent. Vertebral arteries are patent within the confines of the examination. There is no obvious acute pulmonary thromboembolism. There is no evidence of acute intramural hematoma. There is no evidence of aortic dissection. Magnetic artifact from artificial aortic valve is noted.  There is no evidence of mediastinal mass effect. Lungs are grossly clear. A small cyst in the left lobe of the liver is stable.  IMPRESSION: Stable aneurysmal dilatation of the ascending aorta at 4.6 cm. Ascending thoracic aortic aneurysm. Recommend semi-annual imaging followup by CTA or MRA and referral to cardiothoracic surgery if not already obtained. This recommendation follows 2010  EKG:   EKG is ordered today.  The ekg ordered today demonstrates normal sinus rhythm 68 bpm, within normal limits.  Recent Labs: No results found for requested labs within last 8760 hours.  Recent Lipid Panel    Component Value Date/Time   CHOL 169 01/16/2016 0835   CHOL 133 12/12/2012 0743   TRIG 105 01/16/2016 0835   TRIG 96 12/12/2012 0743   HDL 46 01/16/2016 0835   HDL 45 12/12/2012 0743   CHOLHDL 3.7 01/16/2016 0835   VLDL 21 01/16/2016 0835   LDLCALC 102 01/16/2016 0835   LDLCALC 69 12/12/2012 0743   LDLDIRECT 151.7 06/21/2006 0902    Physical Exam:    VS:  BP 112/70   Pulse 68   Ht 5\' 5"  (1.651 m)   Wt 163 lb 12.8 oz (74.3 kg)   SpO2 98%   BMI 27.26 kg/m     Wt Readings from Last 3 Encounters:  03/13/18 163 lb 12.8 oz (74.3 kg)  10/05/17 172 lb 3.2 oz (78.1 kg)  01/24/17 158 lb 12.8 oz (72 kg)     GEN:  Well nourished, well developed in no acute distress HEENT: Normal NECK: No JVD; No carotid bruits LYMPHATICS: No lymphadenopathy CARDIAC: RRR, there is a grade 2/6 systolic ejection murmur at the right upper sternal border and a normal mechanical A2 RESPIRATORY:  Clear to auscultation without rales, wheezing or rhonchi  ABDOMEN: Soft, non-tender, non-distended MUSCULOSKELETAL:  No edema; No deformity  SKIN: Warm and dry NEUROLOGIC:  Alert and oriented x 3 PSYCHIATRIC:  Normal affect   ASSESSMENT:    1. Aortic valve disease   2. Heart valve replaced   3. Essential hypertension   4. Mixed hyperlipidemia    PLAN:    In order of problems listed above:  1. The patient remains asymptomatic and is tolerating warfarin and aspirin well.  Her last INR is 2.9.  Exam suggest normal valve function.  I have recommended an echocardiogram as it is been several years since her last study.  She requested to wait until next year, so this will be ordered to be completed prior to her return office visit in 1 year. 2. As above 3. Blood pressure is reasonably well controlled.   Considering her aortic dilatation and history of bicuspid aortic valve, will increase carvedilol to 6.25 mg twice daily to keep blood pressure under tight control. 4. I reviewed her recent lipids which demonstrated total cholesterol 178, HDL 50, and LDL 110.  She is treated with fluvastatin.   Medication Adjustments/Labs and Tests Ordered: Current medicines are reviewed at length with the patient today.  Concerns regarding medicines are outlined above.  Orders Placed This Encounter  Procedures  . EKG 12-Lead  . ECHOCARDIOGRAM COMPLETE   Meds ordered this encounter  Medications  . carvedilol (COREG) 6.25  MG tablet    Sig: Take 1 tablet (6.25 mg total) by mouth 2 (two) times daily with a meal.    Dispense:  180 tablet    Refill:  3    Patient Instructions  Medication Instructions:  1) INCREASE COREG to 6.25 mg twice daily  Labwork: None  Testing/Procedures: Your provider has requested that you have an echocardiogram in 1 year. Echocardiography is a painless test that uses sound waves to create images of your heart. It provides your doctor with information about the size and shape of your heart and how well your heart's chambers and valves are working. This procedure takes approximately one hour. There are no restrictions for this procedure.  Follow-Up: You will be called to arrange your ultrasound and visit with Dr. Burt Knack in 1 year.  Any Other Special Instructions Will Be Listed Below (If Applicable).     If you need a refill on your cardiac medications before your next appointment, please call your pharmacy.      Signed, Sherren Mocha, MD  03/15/2018 2:51 PM    [place]

## 2018-03-28 ENCOUNTER — Other Ambulatory Visit: Payer: Self-pay | Admitting: Cardiovascular Disease

## 2018-03-30 ENCOUNTER — Ambulatory Visit: Payer: Federal, State, Local not specified - PPO

## 2018-03-30 ENCOUNTER — Encounter (INDEPENDENT_AMBULATORY_CARE_PROVIDER_SITE_OTHER): Payer: Self-pay

## 2018-03-30 DIAGNOSIS — Q231 Congenital insufficiency of aortic valve: Secondary | ICD-10-CM | POA: Diagnosis not present

## 2018-03-30 DIAGNOSIS — Z5181 Encounter for therapeutic drug level monitoring: Secondary | ICD-10-CM

## 2018-03-30 LAB — POCT INR: INR: 2.4 (ref 2.0–3.0)

## 2018-03-30 NOTE — Patient Instructions (Signed)
Description   Continue taking 7.5mg  daily except 10mg  on Tuesdays, Thursdays and Saturdays.   Recheck INR in 6 weeks.

## 2018-04-02 ENCOUNTER — Other Ambulatory Visit: Payer: Self-pay | Admitting: Cardiovascular Disease

## 2018-04-20 ENCOUNTER — Ambulatory Visit
Admission: RE | Admit: 2018-04-20 | Discharge: 2018-04-20 | Disposition: A | Discharge status: Home / Self Care | Payer: Federal, State, Local not specified - PPO | Source: Ambulatory Visit | Attending: Internal Medicine | Admitting: Internal Medicine

## 2018-04-20 DIAGNOSIS — Z1231 Encounter for screening mammogram for malignant neoplasm of breast: Secondary | ICD-10-CM | POA: Diagnosis not present

## 2018-05-12 ENCOUNTER — Ambulatory Visit: Payer: Federal, State, Local not specified - PPO | Admitting: *Deleted

## 2018-05-12 DIAGNOSIS — Z5181 Encounter for therapeutic drug level monitoring: Secondary | ICD-10-CM | POA: Diagnosis not present

## 2018-05-12 DIAGNOSIS — Q231 Congenital insufficiency of aortic valve: Secondary | ICD-10-CM | POA: Diagnosis not present

## 2018-05-12 LAB — POCT INR: INR: 2.2 (ref 2.0–3.0)

## 2018-05-12 NOTE — Patient Instructions (Signed)
Description   Continue taking 7.5mg  daily except 10mg  on Tuesdays, Thursdays and Saturdays.   Recheck INR in 6 weeks.

## 2018-05-21 ENCOUNTER — Other Ambulatory Visit: Payer: Self-pay | Admitting: Cardiovascular Disease

## 2018-06-28 ENCOUNTER — Ambulatory Visit: Payer: Federal, State, Local not specified - PPO | Admitting: Pharmacist

## 2018-06-28 DIAGNOSIS — Q231 Congenital insufficiency of aortic valve: Secondary | ICD-10-CM | POA: Diagnosis not present

## 2018-06-28 DIAGNOSIS — Z5181 Encounter for therapeutic drug level monitoring: Secondary | ICD-10-CM

## 2018-06-28 LAB — POCT INR: INR: 2.6 (ref 2.0–3.0)

## 2018-06-28 NOTE — Patient Instructions (Signed)
Description   Continue taking 7.5mg  daily except 10mg  on Tuesdays, Thursdays and Saturdays.   Recheck INR in 6 weeks.

## 2018-07-10 ENCOUNTER — Ambulatory Visit: Payer: Federal, State, Local not specified - PPO | Admitting: Cardiovascular Disease

## 2018-07-16 ENCOUNTER — Other Ambulatory Visit: Payer: Self-pay | Admitting: Internal Medicine

## 2018-07-26 DIAGNOSIS — L821 Other seborrheic keratosis: Secondary | ICD-10-CM | POA: Diagnosis not present

## 2018-07-26 DIAGNOSIS — L814 Other melanin hyperpigmentation: Secondary | ICD-10-CM | POA: Diagnosis not present

## 2018-07-26 DIAGNOSIS — D225 Melanocytic nevi of trunk: Secondary | ICD-10-CM | POA: Diagnosis not present

## 2018-07-26 DIAGNOSIS — Z85828 Personal history of other malignant neoplasm of skin: Secondary | ICD-10-CM | POA: Diagnosis not present

## 2018-08-07 ENCOUNTER — Telehealth: Payer: Self-pay | Admitting: *Deleted

## 2018-08-07 NOTE — Telephone Encounter (Signed)
1. Do you currently have a fever? NO (yes = cancel and refer to pcp for e-visit) 2. Have you recently travelled on a cruise, internationally, or to [place], [place], [place], [place], [place], or [place], [place] [employer]) ? No (yes = cancel, stay home, monitor symptoms, and contact pcp or initiate e-visit if symptoms develop) 3. Have you been in contact with someone that is currently pending confirmation of Covid19 testing or has been confirmed to have the [place] virus? NO (yes = cancel, stay home, away from tested individual, monitor symptoms, and contact pcp or initiate e-visit if symptoms develop) 4. Are you currently experiencing fatigue or cough? NO (yes = pt should be prepared to have a mask placed at the time of their visit).  Pt. Advised that we are restricting visitors at this time and anyone present in the vehicle should meet the above criteria as well. Advised that visit will be at curbside for finger stick ONLY and will receive call with instructions. Pt also advised to please bring own pen for signature of arrival document.

## 2018-08-09 ENCOUNTER — Other Ambulatory Visit: Payer: Self-pay

## 2018-08-09 ENCOUNTER — Ambulatory Visit (INDEPENDENT_AMBULATORY_CARE_PROVIDER_SITE_OTHER): Payer: Federal, State, Local not specified - PPO | Admitting: *Deleted

## 2018-08-09 ENCOUNTER — Other Ambulatory Visit: Payer: Self-pay | Admitting: *Deleted

## 2018-08-09 DIAGNOSIS — Z5181 Encounter for therapeutic drug level monitoring: Secondary | ICD-10-CM

## 2018-08-09 DIAGNOSIS — I712 Thoracic aortic aneurysm, without rupture, unspecified: Secondary | ICD-10-CM

## 2018-08-09 DIAGNOSIS — Q231 Congenital insufficiency of aortic valve: Secondary | ICD-10-CM | POA: Diagnosis not present

## 2018-08-09 LAB — POCT INR: INR: 2.9 (ref 2.0–3.0)

## 2018-09-18 DIAGNOSIS — Z79899 Other long term (current) drug therapy: Secondary | ICD-10-CM | POA: Diagnosis not present

## 2018-09-18 DIAGNOSIS — R739 Hyperglycemia, unspecified: Secondary | ICD-10-CM | POA: Diagnosis not present

## 2018-09-18 DIAGNOSIS — Z954 Presence of other heart-valve replacement: Secondary | ICD-10-CM | POA: Diagnosis not present

## 2018-09-26 ENCOUNTER — Other Ambulatory Visit: Payer: Self-pay

## 2018-09-26 ENCOUNTER — Ambulatory Visit
Admission: RE | Admit: 2018-09-26 | Discharge: 2018-09-26 | Disposition: A | Discharge status: Home / Self Care | Payer: Federal, State, Local not specified - PPO | Source: Ambulatory Visit | Attending: Surgery | Admitting: Surgery

## 2018-09-26 DIAGNOSIS — I712 Thoracic aortic aneurysm, without rupture, unspecified: Secondary | ICD-10-CM

## 2018-09-26 MED ORDER — GADOBENATE DIMEGLUMINE 529 MG/ML IV SOLN
15.0000 mL | Freq: Once | INTRAVENOUS | Status: AC | PRN
  Administered 2018-09-26: 15 mL via INTRAVENOUS

## 2018-09-27 ENCOUNTER — Ambulatory Visit: Payer: Federal, State, Local not specified - PPO | Admitting: Surgery

## 2018-09-29 ENCOUNTER — Encounter: Payer: Self-pay | Admitting: Surgery

## 2018-09-29 ENCOUNTER — Other Ambulatory Visit: Payer: Self-pay

## 2018-09-29 ENCOUNTER — Ambulatory Visit: Payer: Federal, State, Local not specified - PPO | Admitting: Surgery

## 2018-09-29 VITALS — BP 150/70 | HR 74 | Temp 97.7°F | Resp 20 | Ht 65.0 in | Wt 160.0 lb

## 2018-09-29 DIAGNOSIS — I712 Thoracic aortic aneurysm, without rupture: Secondary | ICD-10-CM

## 2018-09-29 DIAGNOSIS — I7121 Aneurysm of the ascending aorta, without rupture: Secondary | ICD-10-CM

## 2018-09-29 NOTE — Progress Notes (Signed)
HPI:  The patient is a 63 year old woman who returns today for follow-up of an ascending aortic aneurysm following AVR with a Saint Jude mechanical valve in 1999 for severe bicuspid aortic valve stenosis.  I have been following her ascending aortic aneurysm since 2010 with stable measurements between 4.6 to 4.8 cm.  Her most remote CT on 09/19/2005 showed the ascending aorta to measure 4.4 x 4.3 cm.  She continues to feel well without chest pain or back pain.  She has no shortness of breath.  She is on Coreg to control her blood pressure.  Current Outpatient Medications  Medication Sig Dispense Refill  . aspirin 81 MG EC tablet Take 81 mg by mouth daily.      . carvedilol (COREG) 6.25 MG tablet Take 1 tablet (6.25 mg total) by mouth 2 (two) times daily with a meal. 180 tablet 3  . Cholecalciferol (VITAMIN D) 1000 UNITS capsule Take 1,000 Units by mouth daily.      Marland Kitchen co-enzyme Q-10 30 MG capsule Take 30 mg by mouth daily.     . fluvastatin (LESCOL) 40 MG capsule TAKE 1 CAPSULE AT BEDTIME. 30 capsule 6  . fluvastatin (LESCOL) 40 MG capsule TAKE 1 CAPSULE AT BEDTIME. 30 capsule 11  . metFORMIN (GLUCOPHAGE) 500 MG tablet Take 500 mg by mouth 2 (two) times daily with a meal.    . warfarin (COUMADIN) 10 MG tablet TAKE AS DIRECTED BY COUMADIN CLINIC. 30 tablet 1  . warfarin (COUMADIN) 7.5 MG tablet TAKE AS DIRECTED BY COUMADIN CLINIC. 60 tablet 0   No current facility-administered medications for this visit.      Physical Exam: BP (!) 150/70   Pulse 74   Temp 97.7 F (36.5 C) (Skin)   Resp 20   Ht 5\' 5"  (1.651 m)   Wt 160 lb (72.6 kg)   SpO2 97% Comment: RA  BMI 26.63 kg/m  She looks well. Cardiac exam shows a regular rate and rhythm with a crisp mechanical valve click. Lungs are clear.  Diagnostic Tests:  CLINICAL DATA:  63 year old female with a history of ascending aneurysm  Baseline CT dated 09/16/2005, with measurement approximately 4.6 cm at that time.  EXAM: MRA  CHEST WITH OR WITHOUT CONTRAST  TECHNIQUE: Angiographic images of the chest were obtained using MRA technique without and with intravenous contrast.  CONTRAST:  32mL MULTIHANCE GADOBENATE DIMEGLUMINE 529 MG/ML IV SOLN  COMPARISON:  Multiple priors with the most remote CT May 2007 and the most recent MR imaging 10/05/2017  FINDINGS: VASCULAR  Aorta: Similar configuration of the thoracic aorta with the greatest diameter measured in ascending aorta 4.6 cm. This is unchanged from the most recent comparison, and essentially unchanged from the baseline study of 2007. No evidence of dissection or periaortic fluid. 3 vessel arch with patency of the branch vessels.  No aneurysm of descending thoracic aorta.  Heart: Surgical changes of median sternotomy and aortic valve repair. No pericardial fluid.  Pulmonary Arteries: Unremarkable diameter of the main pulmonary artery with unremarkable size of the bilateral lobar and segmental arteries.  Other: None  NON-VASCULAR  Chest wall: Unremarkable  Lungs: Unremarkable  Mediastinum: No adenopathy. Unremarkable thoracic inlet. Unremarkable thoracic esophagus.  Spine: Incompletely imaged though unremarkable on these sequences.  IMPRESSION: Unchanged ascending aortic aneurysm, measuring 4.6 cm.  Surgical changes of median sternotomy and aortic valve repair  Signed,  Dulcy Fanny. Dellia Nims, [place]  [facility]  [facility]   Electronically Signed  By: Corrie Mckusick D.O.   On: 09/26/2018 13:51   Impression:  She has a stable 4.6 cm fusiform ascending aortic aneurysm which is unchanged from 1 year ago and minimally enlarged dating back to 2007.  I reviewed the MRA images with her and answered her questions.  I stressed the importance of good blood pressure control and recommended that she check her blood pressure at home at different times the day and write down  those results on the chart so she has a better idea of where her blood pressure usually runs.  I recommend keeping it at 130/80 or less.  She may need a higher dose of beta-blocker.  Her ascending aortic aneurysm is still well below the surgical threshold of 5 cm for patient with a bicuspid aortic valve.  I have recommended that she have a follow-up study in 1 year.  Plan:  She will return to see me in 1 year with an MRA of the chest.  I spent 15 minutes performing this established patient evaluation and > 50% of this time was spent face to face counseling and coordinating the care of this patient's aortic aneurysm.    Gaye Pollack, MD [facility] [phone]

## 2018-10-03 ENCOUNTER — Telehealth: Payer: Self-pay

## 2018-10-03 NOTE — Telephone Encounter (Signed)

## 2018-10-04 ENCOUNTER — Ambulatory Visit (INDEPENDENT_AMBULATORY_CARE_PROVIDER_SITE_OTHER): Payer: Federal, State, Local not specified - PPO | Admitting: *Deleted

## 2018-10-04 ENCOUNTER — Other Ambulatory Visit: Payer: Self-pay

## 2018-10-04 DIAGNOSIS — Z5181 Encounter for therapeutic drug level monitoring: Secondary | ICD-10-CM | POA: Diagnosis not present

## 2018-10-04 DIAGNOSIS — Q231 Congenital insufficiency of aortic valve: Secondary | ICD-10-CM

## 2018-10-04 LAB — POCT INR: INR: 3.3 — AB (ref 2.0–3.0)

## 2018-10-04 NOTE — Patient Instructions (Signed)
Description   Spoke with pt and instructed her to hold tomorrow's dose of Coumadin (already taken today's dose) then continue taking 7.5mg  daily except 10mg  on Tuesdays, Thursdays and Saturdays.   Recheck INR in 4 weeks.

## 2018-10-15 ENCOUNTER — Other Ambulatory Visit: Payer: Self-pay | Admitting: Cardiovascular Disease

## 2018-10-25 ENCOUNTER — Telehealth: Payer: Self-pay

## 2018-10-25 DIAGNOSIS — E119 Type 2 diabetes mellitus without complications: Secondary | ICD-10-CM | POA: Diagnosis not present

## 2018-10-25 DIAGNOSIS — H35363 Drusen (degenerative) of macula, bilateral: Secondary | ICD-10-CM | POA: Diagnosis not present

## 2018-10-25 DIAGNOSIS — H25013 Cortical age-related cataract, bilateral: Secondary | ICD-10-CM | POA: Diagnosis not present

## 2018-10-25 DIAGNOSIS — H2511 Age-related nuclear cataract, right eye: Secondary | ICD-10-CM | POA: Diagnosis not present

## 2018-10-25 DIAGNOSIS — H2513 Age-related nuclear cataract, bilateral: Secondary | ICD-10-CM | POA: Diagnosis not present

## 2018-10-25 NOTE — Telephone Encounter (Signed)

## 2018-10-30 ENCOUNTER — Other Ambulatory Visit: Payer: Self-pay | Admitting: Cardiovascular Disease

## 2018-11-01 ENCOUNTER — Ambulatory Visit (INDEPENDENT_AMBULATORY_CARE_PROVIDER_SITE_OTHER): Payer: Federal, State, Local not specified - PPO | Admitting: *Deleted

## 2018-11-01 ENCOUNTER — Other Ambulatory Visit: Payer: Self-pay

## 2018-11-01 DIAGNOSIS — Z5181 Encounter for therapeutic drug level monitoring: Secondary | ICD-10-CM | POA: Diagnosis not present

## 2018-11-01 DIAGNOSIS — Q231 Congenital insufficiency of aortic valve: Secondary | ICD-10-CM | POA: Diagnosis not present

## 2018-11-01 LAB — POCT INR: INR: 3.3 — AB (ref 2.0–3.0)

## 2018-11-01 NOTE — Patient Instructions (Addendum)
Description   Hold coumadin today, then continue taking 7.5mg  daily except 10mg  on Tuesdays and Saturdays.   Recheck INR in 3 weeks. [facility] [phone]

## 2018-11-15 ENCOUNTER — Telehealth: Payer: Self-pay

## 2018-11-15 NOTE — Telephone Encounter (Signed)

## 2018-11-22 ENCOUNTER — Other Ambulatory Visit: Payer: Self-pay

## 2018-11-22 ENCOUNTER — Ambulatory Visit: Payer: Federal, State, Local not specified - PPO | Admitting: *Deleted

## 2018-11-22 DIAGNOSIS — Z5181 Encounter for therapeutic drug level monitoring: Secondary | ICD-10-CM

## 2018-11-22 DIAGNOSIS — Q231 Congenital insufficiency of aortic valve: Secondary | ICD-10-CM | POA: Diagnosis not present

## 2018-11-22 LAB — POCT INR: INR: 3.7 — AB (ref 2.0–3.0)

## 2018-11-22 NOTE — Patient Instructions (Addendum)
Description   Hold coumadin tomorrow then start taking 7.5mg  daily except 10mg  on Tuesdays.   Recheck INR in 3 weeks. [facility] [phone]

## 2018-11-28 DIAGNOSIS — H25013 Cortical age-related cataract, bilateral: Secondary | ICD-10-CM | POA: Diagnosis not present

## 2018-11-28 DIAGNOSIS — H2513 Age-related nuclear cataract, bilateral: Secondary | ICD-10-CM | POA: Diagnosis not present

## 2018-11-28 DIAGNOSIS — H25811 Combined forms of age-related cataract, right eye: Secondary | ICD-10-CM | POA: Diagnosis not present

## 2018-12-12 ENCOUNTER — Encounter (INDEPENDENT_AMBULATORY_CARE_PROVIDER_SITE_OTHER): Payer: Self-pay

## 2018-12-12 ENCOUNTER — Ambulatory Visit: Payer: Federal, State, Local not specified - PPO

## 2018-12-12 ENCOUNTER — Other Ambulatory Visit: Payer: Self-pay

## 2018-12-12 DIAGNOSIS — Q231 Congenital insufficiency of aortic valve: Secondary | ICD-10-CM | POA: Diagnosis not present

## 2018-12-12 DIAGNOSIS — Z5181 Encounter for therapeutic drug level monitoring: Secondary | ICD-10-CM

## 2018-12-12 LAB — POCT INR: INR: 2.6 (ref 2.0–3.0)

## 2018-12-12 NOTE — Patient Instructions (Signed)
Description   Continue on same dosage 7.5mg  daily except 10mg  on Tuesdays.   Recheck INR in 4 weeks. [facility] [phone]

## 2019-01-07 ENCOUNTER — Other Ambulatory Visit: Payer: Self-pay | Admitting: Cardiovascular Disease

## 2019-01-09 ENCOUNTER — Ambulatory Visit: Payer: Federal, State, Local not specified - PPO | Admitting: *Deleted

## 2019-01-09 ENCOUNTER — Other Ambulatory Visit: Payer: Self-pay

## 2019-01-09 DIAGNOSIS — Z5181 Encounter for therapeutic drug level monitoring: Secondary | ICD-10-CM

## 2019-01-09 DIAGNOSIS — Q231 Congenital insufficiency of aortic valve: Secondary | ICD-10-CM | POA: Diagnosis not present

## 2019-01-09 DIAGNOSIS — Q2381 Bicuspid aortic valve: Secondary | ICD-10-CM

## 2019-01-09 LAB — POCT INR: INR: 2.8 (ref 2.0–3.0)

## 2019-01-09 NOTE — Patient Instructions (Signed)
Description   Continue on same dosage 7.5mg  daily except 10mg  on Tuesdays.   Recheck INR in 5 weeks. [facility] [phone]

## 2019-02-13 ENCOUNTER — Other Ambulatory Visit: Payer: Self-pay

## 2019-02-13 ENCOUNTER — Ambulatory Visit: Payer: Federal, State, Local not specified - PPO | Admitting: *Deleted

## 2019-02-13 ENCOUNTER — Encounter (INDEPENDENT_AMBULATORY_CARE_PROVIDER_SITE_OTHER): Payer: Self-pay

## 2019-02-13 DIAGNOSIS — Z5181 Encounter for therapeutic drug level monitoring: Secondary | ICD-10-CM

## 2019-02-13 DIAGNOSIS — Q231 Congenital insufficiency of aortic valve: Secondary | ICD-10-CM

## 2019-02-13 LAB — POCT INR
INR: 4.5 — AB (ref 2.0–3.0)
INR: 4.5 — AB (ref 2.0–3.0)

## 2019-02-13 NOTE — Patient Instructions (Signed)
Description   Hold 10/7 and 10/8, then continue on same dosage 7.5mg  daily except 10mg  on Tuesdays.   Recheck INR in 2 weeks. [facility] [phone]

## 2019-02-27 ENCOUNTER — Other Ambulatory Visit: Payer: Self-pay

## 2019-02-27 ENCOUNTER — Ambulatory Visit: Payer: Federal, State, Local not specified - PPO | Admitting: *Deleted

## 2019-02-27 DIAGNOSIS — Q231 Congenital insufficiency of aortic valve: Secondary | ICD-10-CM

## 2019-02-27 DIAGNOSIS — Z5181 Encounter for therapeutic drug level monitoring: Secondary | ICD-10-CM | POA: Diagnosis not present

## 2019-02-27 LAB — POCT INR: INR: 2.1 (ref 2.0–3.0)

## 2019-02-27 NOTE — Patient Instructions (Signed)
Description    Continue on same dosage 7.5mg  daily except 10mg  on Tuesdays.   Recheck INR in 3 week. [facility] [phone]

## 2019-03-11 ENCOUNTER — Other Ambulatory Visit: Payer: Self-pay | Admitting: Cardiovascular Disease

## 2019-03-14 ENCOUNTER — Other Ambulatory Visit: Payer: Self-pay

## 2019-03-14 ENCOUNTER — Ambulatory Visit (HOSPITAL_COMMUNITY): Payer: Federal, State, Local not specified - PPO | Attending: Cardiovascular Disease

## 2019-03-14 DIAGNOSIS — I359 Nonrheumatic aortic valve disorder, unspecified: Secondary | ICD-10-CM | POA: Diagnosis not present

## 2019-03-15 ENCOUNTER — Telehealth: Payer: Self-pay | Admitting: Cardiovascular Disease

## 2019-03-15 NOTE — Telephone Encounter (Signed)
Patient calling in regards to her Echo results.

## 2019-03-15 NOTE — Telephone Encounter (Signed)
Attempted to call patient. Left message that Dr. Burt Knack will review echo results at her visit tomorrow.

## 2019-03-16 ENCOUNTER — Encounter: Payer: Self-pay | Admitting: Cardiovascular Disease

## 2019-03-16 ENCOUNTER — Other Ambulatory Visit: Payer: Self-pay

## 2019-03-16 ENCOUNTER — Ambulatory Visit: Payer: Federal, State, Local not specified - PPO | Admitting: Cardiovascular Disease

## 2019-03-16 VITALS — BP 132/76 | HR 65 | Ht 65.0 in | Wt 166.8 lb

## 2019-03-16 DIAGNOSIS — I10 Essential (primary) hypertension: Secondary | ICD-10-CM

## 2019-03-16 DIAGNOSIS — I712 Thoracic aortic aneurysm, without rupture, unspecified: Secondary | ICD-10-CM

## 2019-03-16 DIAGNOSIS — I359 Nonrheumatic aortic valve disorder, unspecified: Secondary | ICD-10-CM

## 2019-03-16 MED ORDER — LOSARTAN POTASSIUM 25 MG PO TABS: 25.0000 mg | ORAL_TABLET | Freq: Every day | ORAL | 3 refills | Status: DC

## 2019-03-16 NOTE — Progress Notes (Signed)
Cardiology Office Note:    Date:  03/17/2019   ID:  Monica Key, DOB 01/31/1956, MRN [id number]  PCP:  Josetta Huddle, MD  Cardiologist:  Sherren Mocha, MD  Electrophysiologist:  None   Referring MD: Josetta Huddle, MD   Chief Complaint  Patient presents with  . Follow-up    aortic valve disease    History of Present Illness:    Monica Key is a 63 y.o. female with a hx of mechanical aortic valve replacement in 1999.  She is followed for an ascending aortic aneurysm with maximal diameter approximately 4.6 cm.  She has undergone annual MRA studies and sees Dr. Cyndia Key.  From a symptomatic perspective she is doing very well.  She has no exertional symptoms.  She specifically denies chest pain, chest pressure, shortness of breath, orthopnea, PND, or heart palpitations.  She has not had any bleeding problems on aspirin and warfarin beyond easy bruising.  Past Medical History:  Diagnosis Date  . Diabetes mellitus without complication ([place])   . Hypertension   . Pinched nerve in neck     Past Surgical History:  Procedure Laterality Date  . CARDIAC SURGERY      Current Medications: Current Meds  Medication Sig  . aspirin 81 MG EC tablet Take 81 mg by mouth daily.    . carvedilol (COREG) 6.25 MG tablet TAKE 1 TABLET BY MOUTH TWICE DAILY WITH A MEAL.  Marland Kitchen Cholecalciferol (VITAMIN D) 1000 UNITS capsule Take 1,000 Units by mouth daily.    Marland Kitchen co-enzyme Q-10 30 MG capsule Take 30 mg by mouth daily.   . fluvastatin (LESCOL) 40 MG capsule TAKE 1 CAPSULE AT BEDTIME.  . metFORMIN (GLUCOPHAGE) 500 MG tablet Take 500 mg by mouth 2 (two) times daily with a meal.  . Polyethyl Glycol-Propyl Glycol (SYSTANE OP) Apply 1 drop to eye every morning.  . warfarin (COUMADIN) 10 MG tablet TAKE AS DIRECTED BY COUMADIN CLINIC.  Marland Kitchen warfarin (COUMADIN) 7.5 MG tablet TAKE AS DIRECTED BY COUMADIN CLINIC.     Allergies:   Hydralazine hcl, Penicillins, Sulfonamide derivatives, and Vancomycin   Social History    Socioeconomic History  . Marital status: Married    Spouse name: Not on file  . Number of children: Not on file  . Years of education: Not on file  . Highest education level: Not on file  Occupational History  . Not on file  Social Needs  . Financial resource strain: Not on file  . Food insecurity    Worry: Not on file    Inability: Not on file  . Transportation needs    Medical: Not on file    Non-medical: Not on file  Tobacco Use  . Smoking status: Never Smoker  . Smokeless tobacco: Never Used  Substance and Sexual Activity  . Alcohol use: No  . Drug use: No  . Sexual activity: Not Currently  Lifestyle  . Physical activity    Days per week: Not on file    Minutes per session: Not on file  . Stress: Not on file  Relationships  . Social Herbalist on phone: Not on file    Gets together: Not on file    Attends religious service: Not on file    Active member of club or organization: Not on file    Attends meetings of clubs or organizations: Not on file    Relationship status: Not on file  Other Topics Concern  . Not on file  Social History Narrative  . Not on file     Family History: The patient's family history includes Alzheimer's disease in her mother; Angina in her mother; Transient ischemic attack in her father.  ROS:   Please see the history of present illness.    All other systems reviewed and are negative.  EKGs/Labs/Other Studies Reviewed:    The following studies were reviewed today: Echo: IMPRESSIONS    1. Left ventricular ejection fraction, by visual estimation, is 55 to 60%. The left ventricle has normal function. Left ventricular septal wall thickness was moderately increased. There is moderately increased left ventricular hypertrophy.  2. Global right ventricle has normal systolic function.The right ventricular size is normal. No increase in right ventricular wall thickness.  3. Left atrial size was normal.  4. Right atrial size  was normal.  5. The mitral valve is normal in structure. Trace mitral valve regurgitation.  6. The tricuspid valve is normal in structure. Tricuspid valve regurgitation is mild.  7. Aortic valve regurgitation is not visualized.  8. Post AVR with St Jude mechanical valve no PVL.  9. The pulmonic valve was grossly normal. Pulmonic valve regurgitation is mild. 10. Aortic dilatation noted. 11. There is moderate to severe dilatation of the aortic root measuring 45 mm. 12. Normal pulmonary artery systolic pressure.  Chest MRA: VASCULAR  Aorta: Similar configuration of the thoracic aorta with the greatest diameter measured in ascending aorta 4.6 cm. This is unchanged from the most recent comparison, and essentially unchanged from the baseline study of 2007. No evidence of dissection or periaortic fluid. 3 vessel arch with patency of the branch vessels.  No aneurysm of descending thoracic aorta.  Heart: Surgical changes of median sternotomy and aortic valve repair. No pericardial fluid.  Pulmonary Arteries: Unremarkable diameter of the main pulmonary artery with unremarkable size of the bilateral lobar and segmental arteries.  Other: None  EKG:  EKG is ordered today.  The ekg ordered today demonstrates normal sinus rhythm 70 bpm, PACs, nonspecific ST and T wave abnormality.  Recent Labs: No results found for requested labs within last 8760 hours.  Recent Lipid Panel    Component Value Date/Time   CHOL 169 01/16/2016 0835   CHOL 133 12/12/2012 0743   TRIG 105 01/16/2016 0835   TRIG 96 12/12/2012 0743   HDL 46 01/16/2016 0835   HDL 45 12/12/2012 0743   CHOLHDL 3.7 01/16/2016 0835   VLDL 21 01/16/2016 0835   LDLCALC 102 01/16/2016 0835   LDLCALC 69 12/12/2012 0743   LDLDIRECT 151.7 06/21/2006 0902    Physical Exam:    VS:  BP 132/76   Pulse 65   Ht 5\' 5"  (1.651 m)   Wt 166 lb 12.8 oz (75.7 kg)   SpO2 96%   BMI 27.76 kg/m     Wt Readings from Last 3 Encounters:   03/16/19 166 lb 12.8 oz (75.7 kg)  09/29/18 160 lb (72.6 kg)  03/13/18 163 lb 12.8 oz (74.3 kg)     GEN: Well nourished, well developed in no acute distress HEENT: Normal NECK: No JVD; No carotid bruits LYMPHATICS: No lymphadenopathy CARDIAC: RRR, 2/6 systolic murmur at the right upper sternal border with normal mechanical A2 RESPIRATORY:  Clear to auscultation without rales, wheezing or rhonchi  ABDOMEN: Soft, non-tender, non-distended MUSCULOSKELETAL:  No edema; No deformity  SKIN: Warm and dry NEUROLOGIC:  Alert and oriented x 3 PSYCHIATRIC:  Normal affect   ASSESSMENT:    1. Essential hypertension   2.  Aortic valve disease   3. Thoracic aortic aneurysm without rupture ([place])    PLAN:    In order of problems listed above:  1. The patient's home blood pressures are discussed extensively.  For the most part she has good control blood it is not uncommon for her systolic blood pressure to be greater than 130 mmHg.  In light of her thoracic aortic aneurysm we are aiming for tight blood pressure control.  She will remain on carvedilol 6.25 mg twice daily.  Will add losartan 25 mg daily.  Check metabolic panel in a few weeks. 2. The patient's recent echocardiogram is reviewed and demonstrates normal function of her mechanical aortic valve prosthesis.  She remains on low-dose aspirin for antiplatelet therapy and chronic oral anticoagulation with warfarin. 3. The patient's chest MRA is reviewed and her thoracic aortic dimensions are stable as above.  She is followed by Dr. Cyndia Key on an annual basis.   Medication Adjustments/Labs and Tests Ordered: Current medicines are reviewed at length with the patient today.  Concerns regarding medicines are outlined above.  Orders Placed This Encounter  Procedures  . Basic Metabolic Panel (BMET)  . EKG 12-Lead   Meds ordered this encounter  Medications  . losartan (COZAAR) 25 MG tablet    Sig: Take 1 tablet (25 mg total) by mouth daily.     Dispense:  90 tablet    Refill:  3    Patient Instructions  .Medication Instructions:  1) START LOSARTAN 25 mg daily *If you need a refill on your cardiac medications before your next appointment, please call your pharmacy*  Lab Work: Your provider recommends that you return for lab work in 2 weeks  If you have labs (blood work) drawn today and your tests are completely normal, you will receive your results only by: Marland Kitchen MyChart Message (if you have MyChart) OR . A paper copy in the mail If you have any lab test that is abnormal or we need to change your treatment, we will call you to review the results.   Follow-Up: At [facility], you and your health needs are our priority.  As part of our continuing mission to provide you with exceptional heart care, we have created designated Provider Care Teams.  These Care Teams include your primary Cardiologist (physician) and Advanced Practice Providers (APPs -  Physician Assistants and Nurse Practitioners) who all work together to provide you with the care you need, when you need it. Your next appointment:   12 months The format for your next appointment:   In Person Provider:   You may see Sherren Mocha, MD or one of the following Advanced Practice Providers on your designated Care Team:    Richardson Dopp, PA-C  Vin [place], PA-C  Daune Perch, [place]    Signed, Sherren Mocha, MD  03/17/2019 8:32 AM    [place]

## 2019-03-16 NOTE — Patient Instructions (Signed)
.  Medication Instructions:  1) START LOSARTAN 25 mg daily *If you need a refill on your cardiac medications before your next appointment, please call your pharmacy*  Lab Work: Your provider recommends that you return for lab work in 2 weeks  If you have labs (blood work) drawn today and your tests are completely normal, you will receive your results only by: Marland Kitchen MyChart Message (if you have MyChart) OR . A paper copy in the mail If you have any lab test that is abnormal or we need to change your treatment, we will call you to review the results.   Follow-Up: At [facility] you and your health needs are our priority.  As part of our continuing mission to provide you with exceptional heart care, we have created designated Provider Care Teams.  These Care Teams include your primary Cardiologist (physician) and Advanced Practice Providers (APPs -  Physician Assistants and Nurse Practitioners) who all work together to provide you with the care you need, when you need it. Your next appointment:   12 months The format for your next appointment:   In Person Provider:   You may see Sherren Mocha, MD or one of the following Advanced Practice Providers on your designated Care Team:    Richardson Dopp, PA-C  Vin [place], [place]  Daune Perch, [place]

## 2019-03-17 ENCOUNTER — Encounter: Payer: Self-pay | Admitting: Cardiovascular Disease

## 2019-03-25 ENCOUNTER — Other Ambulatory Visit: Payer: Self-pay | Admitting: Cardiovascular Disease

## 2019-03-26 ENCOUNTER — Other Ambulatory Visit: Payer: Federal, State, Local not specified - PPO | Admitting: *Deleted

## 2019-03-26 ENCOUNTER — Ambulatory Visit: Payer: Federal, State, Local not specified - PPO | Admitting: *Deleted

## 2019-03-26 ENCOUNTER — Other Ambulatory Visit: Payer: Self-pay

## 2019-03-26 DIAGNOSIS — I10 Essential (primary) hypertension: Secondary | ICD-10-CM

## 2019-03-26 DIAGNOSIS — Z5181 Encounter for therapeutic drug level monitoring: Secondary | ICD-10-CM | POA: Diagnosis not present

## 2019-03-26 DIAGNOSIS — Q231 Congenital insufficiency of aortic valve: Secondary | ICD-10-CM

## 2019-03-26 DIAGNOSIS — Q2381 Bicuspid aortic valve: Secondary | ICD-10-CM

## 2019-03-26 LAB — BASIC METABOLIC PANEL
BUN/Creatinine Ratio: 19 (ref 12–28)
BUN: 13 mg/dL (ref 8–27)
CO2: 23 mmol/L (ref 20–29)
Calcium: 9.9 mg/dL (ref 8.7–10.3)
Chloride: 101 mmol/L (ref 96–106)
Creatinine, Ser: 0.69 mg/dL (ref 0.57–1.00)
GFR calc Af Amer: 106 mL/min/{1.73_m2} (ref >59)
GFR calc non Af Amer: 92 mL/min/{1.73_m2} (ref >59)
Glucose: 120 mg/dL — ABNORMAL HIGH (ref 65–99)
Potassium: 4.8 mmol/L (ref 3.5–5.2)
Sodium: 141 mmol/L (ref 134–144)

## 2019-03-26 LAB — POCT INR: INR: 3.7 — AB (ref 2.0–3.0)

## 2019-03-26 NOTE — Patient Instructions (Signed)
Description   Do not take any Warfarin tomorrow then continue on same dosage 7.5mg  daily except 10mg  on Tuesdays.   Recheck INR in 3 weeks. [facility] [phone]

## 2019-03-27 DIAGNOSIS — E559 Vitamin D deficiency, unspecified: Secondary | ICD-10-CM | POA: Diagnosis not present

## 2019-03-27 DIAGNOSIS — E785 Hyperlipidemia, unspecified: Secondary | ICD-10-CM | POA: Diagnosis not present

## 2019-03-27 DIAGNOSIS — R739 Hyperglycemia, unspecified: Secondary | ICD-10-CM | POA: Diagnosis not present

## 2019-03-27 DIAGNOSIS — Z0000 Encounter for general adult medical examination without abnormal findings: Secondary | ICD-10-CM | POA: Diagnosis not present

## 2019-03-27 DIAGNOSIS — Z954 Presence of other heart-valve replacement: Secondary | ICD-10-CM | POA: Diagnosis not present

## 2019-03-27 DIAGNOSIS — Z23 Encounter for immunization: Secondary | ICD-10-CM | POA: Diagnosis not present

## 2019-03-30 ENCOUNTER — Other Ambulatory Visit: Payer: Federal, State, Local not specified - PPO

## 2019-04-01 ENCOUNTER — Other Ambulatory Visit: Payer: Self-pay | Admitting: Cardiovascular Disease

## 2019-04-17 ENCOUNTER — Ambulatory Visit: Payer: Federal, State, Local not specified - PPO

## 2019-04-17 ENCOUNTER — Other Ambulatory Visit: Payer: Self-pay

## 2019-04-17 ENCOUNTER — Encounter (INDEPENDENT_AMBULATORY_CARE_PROVIDER_SITE_OTHER): Payer: Self-pay

## 2019-04-17 DIAGNOSIS — Z5181 Encounter for therapeutic drug level monitoring: Secondary | ICD-10-CM | POA: Diagnosis not present

## 2019-04-17 DIAGNOSIS — Q231 Congenital insufficiency of aortic valve: Secondary | ICD-10-CM | POA: Diagnosis not present

## 2019-04-17 LAB — POCT INR: INR: 2.3 (ref 2.0–3.0)

## 2019-04-17 NOTE — Patient Instructions (Signed)
Description   Continue on same dosage 7.5mg  daily except 10mg  on Tuesdays.   Recheck INR in 4 weeks. [facility] [phone]

## 2019-05-18 ENCOUNTER — Other Ambulatory Visit: Payer: Self-pay

## 2019-05-18 ENCOUNTER — Ambulatory Visit: Payer: Federal, State, Local not specified - PPO | Admitting: *Deleted

## 2019-05-18 ENCOUNTER — Encounter (INDEPENDENT_AMBULATORY_CARE_PROVIDER_SITE_OTHER): Payer: Self-pay

## 2019-05-18 DIAGNOSIS — Z5181 Encounter for therapeutic drug level monitoring: Secondary | ICD-10-CM

## 2019-05-18 DIAGNOSIS — Q231 Congenital insufficiency of aortic valve: Secondary | ICD-10-CM | POA: Diagnosis not present

## 2019-05-18 LAB — POCT INR: INR: 3.6 — AB (ref 2.0–3.0)

## 2019-05-18 NOTE — Patient Instructions (Addendum)
Description   Do not take any Warfarin tomorrow then continue on same dosage 7.5mg  daily except 10mg  on Tuesdays.   Recheck INR in 3 weeks. [facility] [phone]

## 2019-06-10 ENCOUNTER — Other Ambulatory Visit: Payer: Self-pay | Admitting: Cardiovascular Disease

## 2019-06-11 ENCOUNTER — Ambulatory Visit: Payer: Federal, State, Local not specified - PPO | Admitting: *Deleted

## 2019-06-11 ENCOUNTER — Encounter (INDEPENDENT_AMBULATORY_CARE_PROVIDER_SITE_OTHER): Payer: Self-pay

## 2019-06-11 ENCOUNTER — Other Ambulatory Visit: Payer: Self-pay

## 2019-06-11 DIAGNOSIS — Z5181 Encounter for therapeutic drug level monitoring: Secondary | ICD-10-CM

## 2019-06-11 DIAGNOSIS — Q231 Congenital insufficiency of aortic valve: Secondary | ICD-10-CM

## 2019-06-11 LAB — POCT INR: INR: 2.7 (ref 2.0–3.0)

## 2019-06-11 NOTE — Patient Instructions (Signed)
Description   Continue on same dosage 7.5mg  daily except 10mg  on Tuesdays.   Recheck INR in 4 weeks. [facility] [phone]

## 2019-06-27 DIAGNOSIS — H35363 Drusen (degenerative) of macula, bilateral: Secondary | ICD-10-CM | POA: Diagnosis not present

## 2019-06-27 DIAGNOSIS — H2512 Age-related nuclear cataract, left eye: Secondary | ICD-10-CM | POA: Diagnosis not present

## 2019-06-27 DIAGNOSIS — Z961 Presence of intraocular lens: Secondary | ICD-10-CM | POA: Diagnosis not present

## 2019-06-27 DIAGNOSIS — E119 Type 2 diabetes mellitus without complications: Secondary | ICD-10-CM | POA: Diagnosis not present

## 2019-07-01 ENCOUNTER — Other Ambulatory Visit: Payer: Self-pay | Admitting: Cardiovascular Disease

## 2019-07-08 ENCOUNTER — Other Ambulatory Visit: Payer: Self-pay | Admitting: Cardiovascular Disease

## 2019-07-10 ENCOUNTER — Other Ambulatory Visit: Payer: Self-pay

## 2019-07-10 ENCOUNTER — Ambulatory Visit: Payer: Federal, State, Local not specified - PPO | Admitting: *Deleted

## 2019-07-10 DIAGNOSIS — Z5181 Encounter for therapeutic drug level monitoring: Secondary | ICD-10-CM

## 2019-07-10 DIAGNOSIS — Q231 Congenital insufficiency of aortic valve: Secondary | ICD-10-CM

## 2019-07-10 LAB — POCT INR: INR: 3 (ref 2.0–3.0)

## 2019-07-10 NOTE — Patient Instructions (Signed)
Description   Continue on same dosage 7.5mg  daily except 10mg  on Tuesdays.  Recheck INR in 4 weeks. [facility] [phone]

## 2019-08-14 ENCOUNTER — Ambulatory Visit: Payer: Federal, State, Local not specified - PPO | Admitting: *Deleted

## 2019-08-14 ENCOUNTER — Other Ambulatory Visit: Payer: Self-pay

## 2019-08-14 DIAGNOSIS — Q231 Congenital insufficiency of aortic valve: Secondary | ICD-10-CM | POA: Diagnosis not present

## 2019-08-14 DIAGNOSIS — Z5181 Encounter for therapeutic drug level monitoring: Secondary | ICD-10-CM

## 2019-08-14 LAB — POCT INR: INR: 2.9 (ref 2.0–3.0)

## 2019-08-14 NOTE — Patient Instructions (Signed)
Description   °Continue on same dosage 7.5mg daily except 10mg on Tuesdays.  Recheck INR in 6 weeks. [facility] [phone] °  ° °

## 2019-08-17 ENCOUNTER — Other Ambulatory Visit: Payer: Self-pay | Admitting: Surgery

## 2019-08-17 DIAGNOSIS — I712 Thoracic aortic aneurysm, without rupture, unspecified: Secondary | ICD-10-CM

## 2019-08-28 DIAGNOSIS — L814 Other melanin hyperpigmentation: Secondary | ICD-10-CM | POA: Diagnosis not present

## 2019-08-28 DIAGNOSIS — L821 Other seborrheic keratosis: Secondary | ICD-10-CM | POA: Diagnosis not present

## 2019-08-28 DIAGNOSIS — D225 Melanocytic nevi of trunk: Secondary | ICD-10-CM | POA: Diagnosis not present

## 2019-08-28 DIAGNOSIS — Z85828 Personal history of other malignant neoplasm of skin: Secondary | ICD-10-CM | POA: Diagnosis not present

## 2019-09-25 ENCOUNTER — Other Ambulatory Visit: Payer: Self-pay

## 2019-09-25 ENCOUNTER — Ambulatory Visit: Payer: Federal, State, Local not specified - PPO | Admitting: *Deleted

## 2019-09-25 DIAGNOSIS — Z5181 Encounter for therapeutic drug level monitoring: Secondary | ICD-10-CM

## 2019-09-25 DIAGNOSIS — Q231 Congenital insufficiency of aortic valve: Secondary | ICD-10-CM

## 2019-09-25 LAB — POCT INR: INR: 2 (ref 2.0–3.0)

## 2019-09-25 NOTE — Patient Instructions (Signed)
Description   °Continue on same dosage 7.5mg daily except 10mg on Tuesdays.  Recheck INR in 6 weeks. [facility] [phone] °  ° °

## 2019-09-26 ENCOUNTER — Ambulatory Visit: Payer: Federal, State, Local not specified - PPO | Admitting: Surgery

## 2019-09-26 ENCOUNTER — Encounter: Payer: Self-pay | Admitting: Surgery

## 2019-09-26 ENCOUNTER — Ambulatory Visit
Admission: RE | Admit: 2019-09-26 | Discharge: 2019-09-26 | Disposition: A | Discharge status: Home / Self Care | Payer: Federal, State, Local not specified - PPO | Source: Ambulatory Visit | Attending: Surgery | Admitting: Surgery

## 2019-09-26 VITALS — BP 144/70 | HR 68 | Temp 98.1°F | Resp 20 | Ht 65.0 in | Wt 174.0 lb

## 2019-09-26 DIAGNOSIS — I712 Thoracic aortic aneurysm, without rupture, unspecified: Secondary | ICD-10-CM

## 2019-09-26 MED ORDER — IOPAMIDOL (ISOVUE-370) INJECTION 76%
75.0000 mL | Freq: Once | INTRAVENOUS | Status: AC | PRN
  Administered 2019-09-26: 75 mL via INTRAVENOUS

## 2019-09-26 NOTE — Progress Notes (Signed)
HPI:  The patient is a 64 year old woman who returns today for follow-up of an ascending aortic aneurysm following AVR with a Saint Jude mechanical valve in 1999 for severe bicuspid aortic valve stenosis.  I have been following her ascending aortic aneurysm since 2010 with stable measurements between 4.6 to 4.8 cm.  Her most remote CT on 09/19/2005 showed the ascending aorta to measure 4.4 x 4.3 cm.  Since I saw her 1 year ago she has been feeling well without chest or back pain.  She has been following her blood pressure at home and is usually under good control with systolic blood pressure in the 120s.  Current Outpatient Medications  Medication Sig Dispense Refill  . aspirin 81 MG EC tablet Take 81 mg by mouth daily.      . carvedilol (COREG) 6.25 MG tablet TAKE 1 TABLET BY MOUTH TWICE DAILY WITH A MEAL. 180 tablet 3  . Cholecalciferol (VITAMIN D) 1000 UNITS capsule Take 1,000 Units by mouth daily.      Marland Kitchen co-enzyme Q-10 30 MG capsule Take 30 mg by mouth daily.     . fluvastatin (LESCOL) 40 MG capsule TAKE 1 CAPSULE AT BEDTIME. 30 capsule 11  . losartan (COZAAR) 25 MG tablet Take 1 tablet (25 mg total) by mouth daily. 90 tablet 3  . metFORMIN (GLUCOPHAGE) 500 MG tablet Take 500 mg by mouth 2 (two) times daily with a meal.    . Polyethyl Glycol-Propyl Glycol (SYSTANE OP) Apply 1 drop to eye every morning.    . warfarin (COUMADIN) 10 MG tablet TAKE 10MG  ON TUESDAYS OR AS DIRECTED BY COUMADIN CLINIC (7.5MG  TAB ALL OTHER DAYS). 15 tablet 0  . warfarin (COUMADIN) 7.5 MG tablet TAKE AS DIRECTED BY COUMADIN CLINIC. 90 tablet 1   No current facility-administered medications for this visit.     Physical Exam: BP (!) 144/70 (BP Location: Right Arm, Patient Position: Sitting, Cuff Size: Normal)   Pulse 68   Temp 98.1 F (36.7 C) (Temporal)   Resp 20   Ht 5\' 5"  (1.651 m)   Wt 174 lb (78.9 kg)   SpO2 98% Comment: RA  BMI 28.96 kg/m  She looks well. Cardiac exam shows a regular rate and  rhythm with a crisp mechanical valve click.  There is no murmur.  Diagnostic Tests:  CLINICAL DATA:  Follow-up thoracic aortic aneurysm. History of aortic valve replacement.  EXAM: CT ANGIOGRAPHY CHEST WITH CONTRAST  TECHNIQUE: Multidetector CT imaging of the chest was performed using the standard protocol during bolus administration of intravenous contrast. Multiplanar CT image reconstructions and MIPs were obtained to evaluate the vascular anatomy.  CONTRAST:  5mL ISOVUE-370 IOPAMIDOL (ISOVUE-370) INJECTION 76%  COMPARISON:  MRI a chest 09/26/2018.  CTA chest 09/16/2005  FINDINGS: Cardiovascular: Status post aortic valve replacement. Fusiform aneurysm of the ascending thoracic aorta measures up to 4.6 cm and unchanged from the prior MRA. Typical three-vessel arch anatomy. Proximal aortic arch measures up to 3.7 cm and stable. Proximal descending thoracic aorta measures 2.7 cm and minimally changed. Negative for an aortic dissection. Distal descending thoracic aorta is stable measuring 2.1 cm. Normal caliber of the proximal abdominal aorta. Celiac trunk, SMA and bilateral renal arteries are patent. Incidentally, there is a replaced common hepatic artery originating from the SMA. Small amount of wall calcifications involving the descending thoracic aorta.  Mediastinum/Nodes: No evidence for chest lymphadenopathy. Esophagus is unremarkable. No axillary lymph node enlargement.  Lungs/Pleura: Trachea and mainstem bronchi are patent. No  large areas of lung consolidation or airspace disease. No pleural effusions.  Upper Abdomen: Images of the upper abdomen are unremarkable.  Musculoskeletal: Median sternotomy wires. No acute bone abnormality.  Review of the MIP images confirms the above findings.  IMPRESSION: 1. Stable fusiform aneurysm of the ascending thoracic aorta measuring 4.6 cm. Aortic aneurysm NOS (ICD10-I71.9). 2.  Aortic Atherosclerosis  (ICD10-I70.0). 3. No acute chest abnormality.   Electronically Signed   By: Markus Daft M.D.   On: 09/26/2019 15:55  Impression:  She has a stable 4.6 cm fusiform ascending aortic aneurysm status post AVR for bicuspid aortic stenosis in 1999.  This is minimally changed dating back to 2007 when it was measured at 4.4 x 4.3 cm.  This is still well below the surgical threshold of 5 to 5.5 cm in a patient with bicuspid aortic valve disease history.  I reviewed the CT images with her and answered her questions.  I stressed the importance of continued good blood pressure control and preventing further enlargement and acute aortic dissection.  Plan:  I will see her back in 1 year with an MRA of the chest.  I spent 20 minutes performing this established patient evaluation and > 50% of this time was spent face to face counseling and coordinating the care of this patient's aortic aneurysm.    Gaye Pollack, MD [facility] [phone]

## 2019-10-10 DIAGNOSIS — L601 Onycholysis: Secondary | ICD-10-CM | POA: Diagnosis not present

## 2019-10-10 DIAGNOSIS — D3617 Benign neoplasm of peripheral nerves and autonomic nervous system of trunk, unspecified: Secondary | ICD-10-CM | POA: Diagnosis not present

## 2019-10-10 DIAGNOSIS — D485 Neoplasm of uncertain behavior of skin: Secondary | ICD-10-CM | POA: Diagnosis not present

## 2019-10-21 ENCOUNTER — Other Ambulatory Visit: Payer: Self-pay | Admitting: Cardiovascular Disease

## 2019-11-06 ENCOUNTER — Other Ambulatory Visit: Payer: Self-pay

## 2019-11-06 ENCOUNTER — Ambulatory Visit (INDEPENDENT_AMBULATORY_CARE_PROVIDER_SITE_OTHER): Payer: Federal, State, Local not specified - PPO

## 2019-11-06 DIAGNOSIS — Q231 Congenital insufficiency of aortic valve: Secondary | ICD-10-CM

## 2019-11-06 DIAGNOSIS — Z5181 Encounter for therapeutic drug level monitoring: Secondary | ICD-10-CM

## 2019-11-06 LAB — POCT INR: INR: 2.7 (ref 2.0–3.0)

## 2019-11-06 MED ORDER — WARFARIN SODIUM 10 MG PO TABS: ORAL_TABLET | ORAL | 0 refills | Status: DC

## 2019-11-06 NOTE — Patient Instructions (Signed)
Continue on same dosage 7.5mg  daily except 10mg  on Tuesdays.  Recheck INR in 6 weeks. [facility] [phone]

## 2019-11-22 DIAGNOSIS — H35363 Drusen (degenerative) of macula, bilateral: Secondary | ICD-10-CM | POA: Diagnosis not present

## 2019-11-22 DIAGNOSIS — H2512 Age-related nuclear cataract, left eye: Secondary | ICD-10-CM | POA: Diagnosis not present

## 2019-11-22 DIAGNOSIS — E119 Type 2 diabetes mellitus without complications: Secondary | ICD-10-CM | POA: Diagnosis not present

## 2019-11-22 DIAGNOSIS — H25012 Cortical age-related cataract, left eye: Secondary | ICD-10-CM | POA: Diagnosis not present

## 2019-12-17 ENCOUNTER — Other Ambulatory Visit: Payer: Self-pay

## 2019-12-17 ENCOUNTER — Ambulatory Visit: Payer: Federal, State, Local not specified - PPO | Admitting: *Deleted

## 2019-12-17 DIAGNOSIS — Z5181 Encounter for therapeutic drug level monitoring: Secondary | ICD-10-CM

## 2019-12-17 DIAGNOSIS — Q231 Congenital insufficiency of aortic valve: Secondary | ICD-10-CM | POA: Diagnosis not present

## 2019-12-17 LAB — POCT INR: INR: 2.6 (ref 2.0–3.0)

## 2019-12-17 NOTE — Patient Instructions (Signed)
Description   Continue on same dosage 7.5mg  daily except 10mg  on Tuesdays.  Recheck INR in 6 weeks. [facility] [phone]

## 2019-12-18 DIAGNOSIS — H2512 Age-related nuclear cataract, left eye: Secondary | ICD-10-CM | POA: Diagnosis not present

## 2019-12-18 DIAGNOSIS — H52212 Irregular astigmatism, left eye: Secondary | ICD-10-CM | POA: Diagnosis not present

## 2019-12-18 DIAGNOSIS — H25012 Cortical age-related cataract, left eye: Secondary | ICD-10-CM | POA: Diagnosis not present

## 2019-12-18 DIAGNOSIS — H25811 Combined forms of age-related cataract, right eye: Secondary | ICD-10-CM | POA: Diagnosis not present

## 2019-12-18 DIAGNOSIS — H25812 Combined forms of age-related cataract, left eye: Secondary | ICD-10-CM | POA: Diagnosis not present

## 2020-01-02 DIAGNOSIS — C44311 Basal cell carcinoma of skin of nose: Secondary | ICD-10-CM | POA: Diagnosis not present

## 2020-01-02 DIAGNOSIS — D485 Neoplasm of uncertain behavior of skin: Secondary | ICD-10-CM | POA: Diagnosis not present

## 2020-01-28 ENCOUNTER — Other Ambulatory Visit: Payer: Self-pay

## 2020-01-28 ENCOUNTER — Ambulatory Visit: Payer: Federal, State, Local not specified - PPO | Admitting: *Deleted

## 2020-01-28 DIAGNOSIS — Z5181 Encounter for therapeutic drug level monitoring: Secondary | ICD-10-CM | POA: Diagnosis not present

## 2020-01-28 DIAGNOSIS — Q231 Congenital insufficiency of aortic valve: Secondary | ICD-10-CM | POA: Diagnosis not present

## 2020-01-28 LAB — POCT INR: INR: 3.3 — AB (ref 2.0–3.0)

## 2020-01-28 NOTE — Patient Instructions (Signed)
Description   Hold warfarin tomorrow, then continue on same dosage 7.5mg  daily except 10mg  on Tuesdays.  Recheck INR in 3 weeks. [facility] [phone]

## 2020-02-10 ENCOUNTER — Other Ambulatory Visit: Payer: Self-pay | Admitting: Cardiovascular Disease

## 2020-02-19 ENCOUNTER — Other Ambulatory Visit: Payer: Self-pay

## 2020-02-19 ENCOUNTER — Ambulatory Visit: Payer: Federal, State, Local not specified - PPO

## 2020-02-19 DIAGNOSIS — Z5181 Encounter for therapeutic drug level monitoring: Secondary | ICD-10-CM | POA: Diagnosis not present

## 2020-02-19 DIAGNOSIS — Q231 Congenital insufficiency of aortic valve: Secondary | ICD-10-CM

## 2020-02-19 LAB — POCT INR: INR: 2.8 (ref 2.0–3.0)

## 2020-02-19 NOTE — Patient Instructions (Addendum)
Description   Continue on same dosage 7.5mg  daily except 10mg  on Tuesdays.  Recheck INR in 3 weeks prior to MOHS procedure. [facility] [phone]

## 2020-03-07 ENCOUNTER — Other Ambulatory Visit: Payer: Self-pay | Admitting: Cardiovascular Disease

## 2020-03-11 ENCOUNTER — Other Ambulatory Visit: Payer: Self-pay

## 2020-03-11 ENCOUNTER — Ambulatory Visit: Payer: Federal, State, Local not specified - PPO

## 2020-03-11 DIAGNOSIS — Z5181 Encounter for therapeutic drug level monitoring: Secondary | ICD-10-CM | POA: Diagnosis not present

## 2020-03-11 DIAGNOSIS — Q231 Congenital insufficiency of aortic valve: Secondary | ICD-10-CM | POA: Diagnosis not present

## 2020-03-11 LAB — POCT INR: INR: 3 (ref 2.0–3.0)

## 2020-03-11 NOTE — Patient Instructions (Signed)
Description   Continue on same dosage 7.5mg  daily except 10mg  on Tuesdays.  Recheck INR in 4 weeks prior to MOHS procedure. [facility] [phone]

## 2020-03-13 DIAGNOSIS — C44311 Basal cell carcinoma of skin of nose: Secondary | ICD-10-CM | POA: Diagnosis not present

## 2020-03-17 ENCOUNTER — Telehealth: Payer: Self-pay | Admitting: *Deleted

## 2020-03-17 NOTE — Telephone Encounter (Signed)
Pt was scheduled for a office visit 03/19/20 with Robbie Lis, PA-C at 8;15, [name] is actually virtual in AM that day. Called pt and she has agreed with a virtual phone call.    Patient Consent for Virtual Visit         Monica Key has provided verbal consent on 03/17/2020 for a virtual visit (video or telephone).   CONSENT FOR VIRTUAL VISIT FOR:  Monica Key  By participating in this virtual visit I agree to the following:  I hereby voluntarily request, consent and authorize [facility] and its employed or contracted physicians, physician assistants, nurse practitioners or other licensed health care professionals (the Practitioner), to provide me with telemedicine health care services (the "Services") as deemed necessary by the treating Practitioner. I acknowledge and consent to receive the Services by the Practitioner via telemedicine. I understand that the telemedicine visit will involve communicating with the Practitioner through live audiovisual communication technology and the disclosure of certain medical information by electronic transmission. I acknowledge that I have been given the opportunity to request an in-person assessment or other available alternative prior to the telemedicine visit and am voluntarily participating in the telemedicine visit.  I understand that I have the right to withhold or withdraw my consent to the use of telemedicine in the course of my care at any time, without affecting my right to future care or treatment, and that the Practitioner or I may terminate the telemedicine visit at any time. I understand that I have the right to inspect all information obtained and/or recorded in the course of the telemedicine visit and may receive copies of available information for a reasonable fee.  I understand that some of the potential risks of receiving the Services via telemedicine include:  Marland Kitchen Delay or interruption in medical evaluation due to technological equipment  failure or disruption; . Information transmitted may not be sufficient (e.g. poor resolution of images) to allow for appropriate medical decision making by the Practitioner; and/or  . In rare instances, security protocols could fail, causing a breach of personal health information.  Furthermore, I acknowledge that it is my responsibility to provide information about my medical history, conditions and care that is complete and accurate to the best of my ability. I acknowledge that Practitioner's advice, recommendations, and/or decision may be based on factors not within their control, such as incomplete or inaccurate data provided by me or distortions of diagnostic images or specimens that may result from electronic transmissions. I understand that the practice of medicine is not an exact science and that Practitioner makes no warranties or guarantees regarding treatment outcomes. I acknowledge that a copy of this consent can be made available to me via my patient portal ([facility]), or I can request a printed copy by calling the office of [facility].    I understand that my insurance will be billed for this visit.   I have read or had this consent read to me. . I understand the contents of this consent, which adequately explains the benefits and risks of the Services being provided via telemedicine.  . I have been provided ample opportunity to ask questions regarding this consent and the Services and have had my questions answered to my satisfaction. . I give my informed consent for the services to be provided through the use of telemedicine in my medical care

## 2020-03-19 ENCOUNTER — Encounter: Payer: Self-pay | Admitting: Physician Assistant

## 2020-03-19 ENCOUNTER — Telehealth (INDEPENDENT_AMBULATORY_CARE_PROVIDER_SITE_OTHER): Payer: Federal, State, Local not specified - PPO | Admitting: Physician Assistant

## 2020-03-19 ENCOUNTER — Other Ambulatory Visit: Payer: Self-pay

## 2020-03-19 VITALS — BP 121/68 | HR 64 | Ht 65.0 in | Wt 174.0 lb

## 2020-03-19 DIAGNOSIS — Z952 Presence of prosthetic heart valve: Secondary | ICD-10-CM

## 2020-03-19 DIAGNOSIS — I712 Thoracic aortic aneurysm, without rupture, unspecified: Secondary | ICD-10-CM

## 2020-03-19 DIAGNOSIS — Q231 Congenital insufficiency of aortic valve: Secondary | ICD-10-CM

## 2020-03-19 DIAGNOSIS — I10 Essential (primary) hypertension: Secondary | ICD-10-CM | POA: Diagnosis not present

## 2020-03-19 NOTE — Progress Notes (Signed)
Virtual Visit via Telephone Note   This visit type was conducted due to national recommendations for restrictions regarding the COVID-19 Pandemic (e.g. social distancing) in an effort to limit this patient's exposure and mitigate transmission in our community.  Due to her co-morbid illnesses, this patient is at least at moderate risk for complications without adequate follow up.  This format is felt to be most appropriate for this patient at this time.  The patient did not have access to video technology/had technical difficulties with video requiring transitioning to audio format only (telephone).  All issues noted in this document were discussed and addressed.  No physical exam could be performed with this format.  Please refer to the patient's chart for her  consent to telehealth for [facility].    Date:  03/19/2020   ID:  Monica Key, DOB Apr 05, 1956, MRN [id number] The patient was identified using 2 identifiers.  Patient Location: Home Provider Location: Home Office  PCP:  Josetta Huddle, MD  Cardiologist:  Sherren Mocha, MD  Electrophysiologist:  None   Evaluation Performed:  Follow-Up Visit  Chief Complaint:  Yearly follow up   History of Present Illness:    Monica Key is a 64 y.o. female with hx of bicuspid aortic valve s/p St. Jude mechanical valve in 1999 and ascending aortic aneurysm (followed by Dr. Cyndia Bent), DM and HTN seen for yearly follow up.   Seen virtually for follow-up.  No complaints.  She has got her vaccine for COVID-19.  Does exercise and elliptical without any limitation.  Blood pressure excellently controlled.  Reports taking Metformin for prediabetes.  Denies chest pain, shortness of breath, orthopnea, PND, syncope, lower extremity edema, melena or blood in her stool or urine.  The patient does not have symptoms concerning for COVID-19 infection (fever, chills, cough, or new shortness of breath).    Past Medical History:  Diagnosis Date  . Diabetes  mellitus without complication ([place])   . Hypertension   . Pinched nerve in neck    Past Surgical History:  Procedure Laterality Date  . CARDIAC SURGERY       Current Meds  Medication Sig  . aspirin 81 MG EC tablet Take 81 mg by mouth daily.    . carvedilol (COREG) 6.25 MG tablet TAKE 1 TABLET BY MOUTH TWICE DAILY WITH A MEAL.  Marland Kitchen Cholecalciferol (VITAMIN D) 1000 UNITS capsule Take 1,000 Units by mouth daily.    Marland Kitchen co-enzyme Q-10 30 MG capsule Take 30 mg by mouth daily.   . fluvastatin (LESCOL) 40 MG capsule TAKE 1 CAPSULE AT BEDTIME.  Marland Kitchen losartan (COZAAR) 25 MG tablet Take 1 tablet (25 mg total) by mouth daily.  . metFORMIN (GLUCOPHAGE) 500 MG tablet Take 500 mg by mouth 2 (two) times daily with a meal.  . Polyethyl Glycol-Propyl Glycol (SYSTANE OP) Apply 1 drop to eye every morning.  . warfarin (COUMADIN) 10 MG tablet Take as directed by the coumadin clinic  . warfarin (COUMADIN) 7.5 MG tablet TAKE AS DIRECTED BY COUMADIN CLINIC.     Allergies:   Hydralazine hcl, Penicillins, Sulfonamide derivatives, and Vancomycin   Social History   Tobacco Use  . Smoking status: Never Smoker  . Smokeless tobacco: Never Used  Vaping Use  . Vaping Use: Never used  Substance Use Topics  . Alcohol use: No  . Drug use: No     Family Hx: The patient's family history includes Alzheimer's disease in her mother; Angina in her mother; Transient ischemic attack  in her father.  ROS:   Please see the history of present illness.    All other systems reviewed and are negative.   Prior CV studies:   The following studies were reviewed today:  Echo 03/2019 1. Left ventricular ejection fraction, by visual estimation, is 55 to  60%. The left ventricle has normal function. Left ventricular septal wall  thickness was moderately increased. There is moderately increased left  ventricular hypertrophy.  2. Global right ventricle has normal systolic function.The right  ventricular size is normal. No  increase in right ventricular wall  thickness.  3. Left atrial size was normal.  4. Right atrial size was normal.  5. The mitral valve is normal in structure. Trace mitral valve  regurgitation.  6. The tricuspid valve is normal in structure. Tricuspid valve  regurgitation is mild.  7. Aortic valve regurgitation is not visualized.  8. Post AVR with St Jude mechanical valve no PVL.  9. The pulmonic valve was grossly normal. Pulmonic valve regurgitation is  mild.  10. Aortic dilatation noted.  11. There is moderate to severe dilatation of the aortic root measuring 45  mm.  12. Normal pulmonary artery systolic pressure.   Labs/Other Tests and Data Reviewed:    EKG:  No ECG reviewed.  Recent Labs: 03/26/2019: BUN 13; Creatinine, Ser 0.69; Potassium 4.8; Sodium 141   Recent Lipid Panel Lab Results  Component Value Date/Time   CHOL 169 01/16/2016 08:35 AM   CHOL 133 12/12/2012 07:43 AM   TRIG 105 01/16/2016 08:35 AM   TRIG 96 12/12/2012 07:43 AM   HDL 46 01/16/2016 08:35 AM   HDL 45 12/12/2012 07:43 AM   CHOLHDL 3.7 01/16/2016 08:35 AM   LDLCALC 102 01/16/2016 08:35 AM   LDLCALC 69 12/12/2012 07:43 AM   LDLDIRECT 151.7 06/21/2006 09:02 AM    Wt Readings from Last 3 Encounters:  03/19/20 174 lb (78.9 kg)  09/26/19 174 lb (78.9 kg)  03/16/19 166 lb 12.8 oz (75.7 kg)     Risk Assessment/Calculations:      Objective:    Vital Signs:  BP 121/68   Pulse 64   Ht 5\' 5"  (1.651 m)   Wt 174 lb (78.9 kg)   BMI 28.96 kg/m    VITAL SIGNS:  reviewed GEN:  no acute distress PSYCH:  normal affect  ASSESSMENT & PLAN:    1. Recently aortic aneurysm -Stable by CT of chest May 2021.  This is followed by Dr. Cyndia Bent.  2.  Status post mechanical aortic valve -Normal functioning valve by echocardiogram in 2020. -No dyspnea or syncope -Continue Coumadin -Denies bleeding issue  3.  Hypertension -Excellently controlled. -Discussed goal and importance of strict blood  pressure control -Continue current medical therapy.  COVID-19 Education: The signs and symptoms of COVID-19 were discussed with the patient and how to seek care for testing (follow up with PCP or arrange E-visit).  The importance of social distancing was discussed today.  Time:   Today, I have spent 11 minutes with the patient with telehealth technology discussing the above problems.     Medication Adjustments/Labs and Tests Ordered: Current medicines are reviewed at length with the patient today.  Concerns regarding medicines are outlined above.   Tests Ordered: No orders of the defined types were placed in this encounter.   Medication Changes: No orders of the defined types were placed in this encounter.   Follow Up:  Virtual Visit  in 1 year(s)  Signed, Leanor Kail, PA  03/19/2020 8:54 AM    [facility]

## 2020-03-19 NOTE — Patient Instructions (Signed)

## 2020-03-23 ENCOUNTER — Other Ambulatory Visit: Payer: Self-pay | Admitting: Cardiovascular Disease

## 2020-03-24 DIAGNOSIS — D485 Neoplasm of uncertain behavior of skin: Secondary | ICD-10-CM | POA: Diagnosis not present

## 2020-03-24 DIAGNOSIS — D2372 Other benign neoplasm of skin of left lower limb, including hip: Secondary | ICD-10-CM | POA: Diagnosis not present

## 2020-04-09 ENCOUNTER — Other Ambulatory Visit: Payer: Self-pay

## 2020-04-09 ENCOUNTER — Ambulatory Visit: Payer: Federal, State, Local not specified - PPO | Admitting: *Deleted

## 2020-04-09 DIAGNOSIS — Z5181 Encounter for therapeutic drug level monitoring: Secondary | ICD-10-CM | POA: Diagnosis not present

## 2020-04-09 DIAGNOSIS — Q231 Congenital insufficiency of aortic valve: Secondary | ICD-10-CM | POA: Diagnosis not present

## 2020-04-09 LAB — POCT INR: INR: 3.3 — AB (ref 2.0–3.0)

## 2020-04-09 NOTE — Patient Instructions (Signed)
Description   Hold tomorrow's dose then continue taking 7.5mg  daily except 10mg  on Tuesdays.  Recheck INR in 4 weeks. Coumadin Clinic [phone]

## 2020-04-21 DIAGNOSIS — R739 Hyperglycemia, unspecified: Secondary | ICD-10-CM | POA: Diagnosis not present

## 2020-04-21 DIAGNOSIS — E785 Hyperlipidemia, unspecified: Secondary | ICD-10-CM | POA: Diagnosis not present

## 2020-04-21 DIAGNOSIS — Z954 Presence of other heart-valve replacement: Secondary | ICD-10-CM | POA: Diagnosis not present

## 2020-04-21 DIAGNOSIS — I712 Thoracic aortic aneurysm, without rupture: Secondary | ICD-10-CM | POA: Diagnosis not present

## 2020-04-21 DIAGNOSIS — E559 Vitamin D deficiency, unspecified: Secondary | ICD-10-CM | POA: Diagnosis not present

## 2020-04-21 DIAGNOSIS — Z0000 Encounter for general adult medical examination without abnormal findings: Secondary | ICD-10-CM | POA: Diagnosis not present

## 2020-04-21 DIAGNOSIS — D6869 Other thrombophilia: Secondary | ICD-10-CM | POA: Diagnosis not present

## 2020-04-21 DIAGNOSIS — Z79899 Other long term (current) drug therapy: Secondary | ICD-10-CM | POA: Diagnosis not present

## 2020-04-21 DIAGNOSIS — Z23 Encounter for immunization: Secondary | ICD-10-CM | POA: Diagnosis not present

## 2020-05-08 ENCOUNTER — Other Ambulatory Visit: Payer: Self-pay

## 2020-05-08 ENCOUNTER — Ambulatory Visit: Payer: Federal, State, Local not specified - PPO | Admitting: *Deleted

## 2020-05-08 DIAGNOSIS — Z5181 Encounter for therapeutic drug level monitoring: Secondary | ICD-10-CM

## 2020-05-08 DIAGNOSIS — Q231 Congenital insufficiency of aortic valve: Secondary | ICD-10-CM | POA: Diagnosis not present

## 2020-05-08 LAB — POCT INR: INR: 2.5 (ref 2.0–3.0)

## 2020-05-08 NOTE — Patient Instructions (Signed)
Description   Continue taking 7.5mg  daily except 10mg  on Tuesdays.  Recheck INR in 5 weeks. Coumadin Clinic [phone]

## 2020-05-25 ENCOUNTER — Other Ambulatory Visit: Payer: Self-pay | Admitting: Cardiovascular Disease

## 2020-05-25 DIAGNOSIS — Z952 Presence of prosthetic heart valve: Secondary | ICD-10-CM

## 2020-06-08 ENCOUNTER — Other Ambulatory Visit: Payer: Self-pay | Admitting: Cardiovascular Disease

## 2020-06-13 ENCOUNTER — Other Ambulatory Visit: Payer: Self-pay

## 2020-06-13 ENCOUNTER — Encounter (INDEPENDENT_AMBULATORY_CARE_PROVIDER_SITE_OTHER): Payer: Self-pay

## 2020-06-13 ENCOUNTER — Ambulatory Visit: Payer: Federal, State, Local not specified - PPO | Admitting: *Deleted

## 2020-06-13 DIAGNOSIS — Z5181 Encounter for therapeutic drug level monitoring: Secondary | ICD-10-CM | POA: Diagnosis not present

## 2020-06-13 DIAGNOSIS — Q231 Congenital insufficiency of aortic valve: Secondary | ICD-10-CM | POA: Diagnosis not present

## 2020-06-13 LAB — POCT INR: INR: 2.2 (ref 2.0–3.0)

## 2020-06-13 NOTE — Patient Instructions (Signed)
Description   Continue taking 7.5mg daily except 10mg on Tuesdays.  Recheck INR in 6 weeks. Coumadin Clinic [phone]     

## 2020-07-17 DIAGNOSIS — H35363 Drusen (degenerative) of macula, bilateral: Secondary | ICD-10-CM | POA: Diagnosis not present

## 2020-07-17 DIAGNOSIS — E119 Type 2 diabetes mellitus without complications: Secondary | ICD-10-CM | POA: Diagnosis not present

## 2020-07-17 DIAGNOSIS — H02051 Trichiasis without entropian right upper eyelid: Secondary | ICD-10-CM | POA: Diagnosis not present

## 2020-07-17 DIAGNOSIS — D492 Neoplasm of unspecified behavior of bone, soft tissue, and skin: Secondary | ICD-10-CM | POA: Diagnosis not present

## 2020-07-28 ENCOUNTER — Ambulatory Visit: Payer: Federal, State, Local not specified - PPO | Admitting: *Deleted

## 2020-07-28 ENCOUNTER — Other Ambulatory Visit: Payer: Self-pay

## 2020-07-28 DIAGNOSIS — Z5181 Encounter for therapeutic drug level monitoring: Secondary | ICD-10-CM

## 2020-07-28 DIAGNOSIS — Q231 Congenital insufficiency of aortic valve: Secondary | ICD-10-CM | POA: Diagnosis not present

## 2020-07-28 LAB — POCT INR: INR: 2.6 (ref 2.0–3.0)

## 2020-07-28 NOTE — Patient Instructions (Signed)
Description   Continue taking 7.5mg  daily except 10mg  on Tuesdays.  Recheck INR in 6 weeks. Coumadin Clinic [phone]

## 2020-08-13 ENCOUNTER — Other Ambulatory Visit: Payer: Self-pay | Admitting: Surgery

## 2020-08-13 DIAGNOSIS — I712 Thoracic aortic aneurysm, without rupture, unspecified: Secondary | ICD-10-CM

## 2020-08-18 ENCOUNTER — Other Ambulatory Visit: Payer: Self-pay

## 2020-08-18 ENCOUNTER — Ambulatory Visit
Admission: RE | Admit: 2020-08-18 | Discharge: 2020-08-18 | Disposition: A | Discharge status: Home / Self Care | Payer: Federal, State, Local not specified - PPO | Source: Ambulatory Visit | Attending: Surgery | Admitting: Surgery

## 2020-08-18 DIAGNOSIS — I712 Thoracic aortic aneurysm, without rupture, unspecified: Secondary | ICD-10-CM

## 2020-08-18 MED ORDER — GADOBENATE DIMEGLUMINE 529 MG/ML IV SOLN
16.0000 mL | Freq: Once | INTRAVENOUS | Status: AC | PRN
  Administered 2020-08-18: 16 mL via INTRAVENOUS

## 2020-09-07 ENCOUNTER — Other Ambulatory Visit: Payer: Self-pay | Admitting: Cardiovascular Disease

## 2020-09-07 DIAGNOSIS — Z952 Presence of prosthetic heart valve: Secondary | ICD-10-CM

## 2020-09-09 ENCOUNTER — Ambulatory Visit: Payer: Federal, State, Local not specified - PPO | Admitting: *Deleted

## 2020-09-09 ENCOUNTER — Other Ambulatory Visit: Payer: Self-pay

## 2020-09-09 DIAGNOSIS — Z5181 Encounter for therapeutic drug level monitoring: Secondary | ICD-10-CM | POA: Diagnosis not present

## 2020-09-09 DIAGNOSIS — Q231 Congenital insufficiency of aortic valve: Secondary | ICD-10-CM | POA: Diagnosis not present

## 2020-09-09 LAB — POCT INR: INR: 3.5 — AB (ref 2.0–3.0)

## 2020-09-09 NOTE — Patient Instructions (Signed)
Description   Hold warfarin today, then continue taking 7.5mg  daily except 10mg  on Tuesdays.  Recheck INR in 3 weeks. Coumadin Clinic [phone]

## 2020-09-14 ENCOUNTER — Other Ambulatory Visit: Payer: Self-pay | Admitting: Cardiovascular Disease

## 2020-10-01 ENCOUNTER — Ambulatory Visit: Payer: Federal, State, Local not specified - PPO | Admitting: Surgery

## 2020-10-01 ENCOUNTER — Ambulatory Visit: Payer: Federal, State, Local not specified - PPO | Admitting: *Deleted

## 2020-10-01 ENCOUNTER — Encounter: Payer: Self-pay | Admitting: Surgery

## 2020-10-01 ENCOUNTER — Other Ambulatory Visit: Payer: Self-pay

## 2020-10-01 VITALS — BP 118/74 | HR 67 | Resp 20 | Ht 65.0 in | Wt 178.0 lb

## 2020-10-01 DIAGNOSIS — Z5181 Encounter for therapeutic drug level monitoring: Secondary | ICD-10-CM | POA: Diagnosis not present

## 2020-10-01 DIAGNOSIS — Q231 Congenital insufficiency of aortic valve: Secondary | ICD-10-CM

## 2020-10-01 DIAGNOSIS — I712 Thoracic aortic aneurysm, without rupture, unspecified: Secondary | ICD-10-CM

## 2020-10-01 LAB — POCT INR: INR: 2.3 (ref 2.0–3.0)

## 2020-10-01 NOTE — Patient Instructions (Signed)
Description   Continue taking 7.5mg  daily except 10mg  on Tuesdays.  Recheck INR in 4 weeks. Coumadin Clinic [phone]

## 2020-10-01 NOTE — Progress Notes (Signed)
HPI:  The patient is a 65 year old woman who returns today for follow-up of an ascending aortic aneurysm following AVR with a Saint Jude mechanical valve in 1999 for severe bicuspid aortic valve stenosis. I have been following her ascending aortic aneurysm since 2010 with stable measurements between 4.6 to 4.8 cm. Her most remote CT on 09/19/2005 showed the ascending aorta to measure 4.4 x 4.3 cm.  Since I saw her 1 year ago she continues to do well without chest pain or shortness of breath.  Current Outpatient Medications  Medication Sig Dispense Refill  . aspirin 81 MG EC tablet Take 81 mg by mouth daily.    . carvedilol (COREG) 6.25 MG tablet TAKE 1 TABLET BY MOUTH TWICE DAILY WITH A MEAL. 180 tablet 3  . Cholecalciferol (VITAMIN D) 1000 UNITS capsule Take 1,000 Units by mouth daily.    Marland Kitchen co-enzyme Q-10 30 MG capsule Take 30 mg by mouth daily.    . fluvastatin (LESCOL) 40 MG capsule TAKE 1 CAPSULE AT BEDTIME. 30 capsule 6  . losartan (COZAAR) 25 MG tablet TAKE 1 TABLET ONCE DAILY. 90 tablet 3  . metFORMIN (GLUCOPHAGE) 500 MG tablet Take 500 mg by mouth 2 (two) times daily with a meal.    . Polyethyl Glycol-Propyl Glycol (SYSTANE OP) Apply 1 drop to eye every morning.    . warfarin (COUMADIN) 10 MG tablet TAKE AS DIRECTED. 15 tablet 0  . warfarin (COUMADIN) 7.5 MG tablet TAKE AS DIRECTED BY COUMADIN CLINIC. 90 tablet 0   No current facility-administered medications for this visit.     Physical Exam: BP 118/74   Pulse 67   Resp 20   Ht 5\' 5"  (1.651 m)   Wt 178 lb (80.7 kg)   SpO2 95% Comment: RA  BMI 29.62 kg/m  She looks well. Cardiac exam shows a regular rate and rhythm with crisp mechanical valve click.  There is a 2/6 systolic flow murmur over the aorta.  There is no diastolic murmur. Lungs clear  Diagnostic Tests:  Narrative & Impression  CLINICAL DATA:  65 year old with an ascending thoracic aortic aneurysm.  EXAM: MRA CHEST WITH OR WITHOUT  CONTRAST  TECHNIQUE: Angiographic images of the chest were obtained using MRA technique without and with intravenous contrast.  CONTRAST:  93mL MULTIHANCE GADOBENATE DIMEGLUMINE 529 MG/ML IV SOLN  COMPARISON:  Chest CTA 09/26/2019  FINDINGS: VASCULAR  Aorta: Aortic valve replacement with median sternotomy wires. Fusiform aneurysm of the ascending thoracic aorta measures 4.6 cm and stable. No evidence for an aortic dissection. Distal ascending thoracic aorta measures 3.8 cm and stable. Typical three-vessel arch anatomy. The great vessels are patent. Proximal descending thoracic aorta measures 2.5 cm and stable. Normal caliber of the proximal abdominal aorta.  Heart: Heart size is normal.  No significant pericardial fluid.  Pulmonary Arteries:  Main and lobar pulmonary arteries are patent.  Other: Celiac trunk and SMA are patent. Bilateral renal arteries are patent.  NON-VASCULAR  Mediastinal structures: No significant chest lymphadenopathy.  Lungs and pleura: No large pleural effusions. No gross lung abnormalities.  Musculoskeletal: No acute abnormality.  Upper abdomen: 1.3 cm structure in the lateral left hepatic lobe probably represents a cyst. No acute abnormality in visualized upper abdominal structures.  IMPRESSION: Stable fusiform aneurysm of the ascending thoracic aorta measuring up to 4.6 cm.   Electronically Signed   By: Markus Daft M.D.   On: 08/18/2020 16:55     Impression:  She has a stable 4.6 cm  fusiform ascending aortic aneurysm status post mechanical aortic valve replacement in 1999.  Her aneurysm has been stable since at least 2010.  This is well below the surgical threshold of 5 to 5.5 cm in a patient with bicuspid aortic valve disease.  I reviewed the MRA images with her and answered all of her questions.  I stressed the importance of continued good blood pressure control in preventing further enlargement and acute aortic  dissection.  Plan:  She will return to see me in 1 year with an MRA of the chest.  I spent 20 minutes performing this established patient evaluation and > 50% of this time was spent face to face counseling and coordinating the care of this patient's aortic aneurysm.    Gaye Pollack, MD [facility] [phone]

## 2020-10-29 ENCOUNTER — Other Ambulatory Visit: Payer: Self-pay

## 2020-10-29 ENCOUNTER — Ambulatory Visit: Payer: Federal, State, Local not specified - PPO | Admitting: Pharmacist

## 2020-10-29 DIAGNOSIS — Z5181 Encounter for therapeutic drug level monitoring: Secondary | ICD-10-CM | POA: Diagnosis not present

## 2020-10-29 DIAGNOSIS — Q231 Congenital insufficiency of aortic valve: Secondary | ICD-10-CM

## 2020-10-29 LAB — POCT INR: INR: 1.6 — AB (ref 2.0–3.0)

## 2020-10-29 NOTE — Patient Instructions (Signed)
Description   Take two tablets today (15 mg total) and then continue taking 7.5mg  daily except 10mg  on Tuesdays.  Recheck INR in 3 weeks. Coumadin Clinic [phone]

## 2020-11-21 ENCOUNTER — Ambulatory Visit: Payer: Federal, State, Local not specified - PPO

## 2020-11-21 ENCOUNTER — Other Ambulatory Visit: Payer: Self-pay

## 2020-11-21 DIAGNOSIS — Z5181 Encounter for therapeutic drug level monitoring: Secondary | ICD-10-CM | POA: Diagnosis not present

## 2020-11-21 DIAGNOSIS — Q231 Congenital insufficiency of aortic valve: Secondary | ICD-10-CM | POA: Diagnosis not present

## 2020-11-21 LAB — POCT INR: INR: 2.7 (ref 2.0–3.0)

## 2020-11-21 NOTE — Patient Instructions (Signed)
-   continue taking 7.5mg  daily except 10mg  on Tuesdays.   - Recheck INR in 4 weeks. Coumadin Clinic [phone]

## 2020-12-15 DIAGNOSIS — L821 Other seborrheic keratosis: Secondary | ICD-10-CM | POA: Diagnosis not present

## 2020-12-15 DIAGNOSIS — Z85828 Personal history of other malignant neoplasm of skin: Secondary | ICD-10-CM | POA: Diagnosis not present

## 2020-12-15 DIAGNOSIS — D225 Melanocytic nevi of trunk: Secondary | ICD-10-CM | POA: Diagnosis not present

## 2020-12-15 DIAGNOSIS — L814 Other melanin hyperpigmentation: Secondary | ICD-10-CM | POA: Diagnosis not present

## 2020-12-19 ENCOUNTER — Other Ambulatory Visit: Payer: Self-pay

## 2020-12-19 ENCOUNTER — Ambulatory Visit: Payer: Federal, State, Local not specified - PPO

## 2020-12-19 DIAGNOSIS — Q231 Congenital insufficiency of aortic valve: Secondary | ICD-10-CM

## 2020-12-19 DIAGNOSIS — Z5181 Encounter for therapeutic drug level monitoring: Secondary | ICD-10-CM

## 2020-12-19 LAB — POCT INR: INR: 3.6 — AB (ref 2.0–3.0)

## 2020-12-19 NOTE — Patient Instructions (Addendum)
    Description   Skip tomorrow's dosage of Warfarin, then resume same dosage 7.'5mg'$  daily except '10mg'$  on Tuesdays.   Recheck INR in 2 weeks. Coumadin Clinic [phone]

## 2020-12-21 ENCOUNTER — Other Ambulatory Visit: Payer: Self-pay | Admitting: Cardiovascular Disease

## 2020-12-21 DIAGNOSIS — Z952 Presence of prosthetic heart valve: Secondary | ICD-10-CM

## 2021-01-02 ENCOUNTER — Other Ambulatory Visit: Payer: Self-pay

## 2021-01-02 ENCOUNTER — Ambulatory Visit: Payer: Federal, State, Local not specified - PPO | Admitting: *Deleted

## 2021-01-02 DIAGNOSIS — Z5181 Encounter for therapeutic drug level monitoring: Secondary | ICD-10-CM | POA: Diagnosis not present

## 2021-01-02 DIAGNOSIS — Q231 Congenital insufficiency of aortic valve: Secondary | ICD-10-CM

## 2021-01-02 LAB — POCT INR: INR: 2.4 (ref 2.0–3.0)

## 2021-01-02 NOTE — Patient Instructions (Signed)
Description   Continue same dose 7.'5mg'$  daily except '10mg'$  on Tuesdays. Recheck INR in 3 weeks. Coumadin Clinic [phone]

## 2021-01-06 DIAGNOSIS — D2230 Melanocytic nevi of unspecified part of face: Secondary | ICD-10-CM | POA: Diagnosis not present

## 2021-01-06 DIAGNOSIS — L821 Other seborrheic keratosis: Secondary | ICD-10-CM | POA: Diagnosis not present

## 2021-01-18 ENCOUNTER — Other Ambulatory Visit: Payer: Self-pay | Admitting: Cardiovascular Disease

## 2021-01-18 DIAGNOSIS — Z952 Presence of prosthetic heart valve: Secondary | ICD-10-CM

## 2021-01-23 ENCOUNTER — Ambulatory Visit: Payer: Federal, State, Local not specified - PPO

## 2021-01-23 ENCOUNTER — Other Ambulatory Visit: Payer: Self-pay

## 2021-01-23 DIAGNOSIS — Q231 Congenital insufficiency of aortic valve: Secondary | ICD-10-CM

## 2021-01-23 DIAGNOSIS — Z5181 Encounter for therapeutic drug level monitoring: Secondary | ICD-10-CM

## 2021-01-23 LAB — POCT INR: INR: 2.5 (ref 2.0–3.0)

## 2021-01-23 NOTE — Patient Instructions (Signed)
Description   Continue same dose 7.'5mg'$  daily except '10mg'$  on Tuesdays. Recheck INR in 5 weeks. Coumadin Clinic [phone]

## 2021-01-29 DIAGNOSIS — Z23 Encounter for immunization: Secondary | ICD-10-CM | POA: Diagnosis not present

## 2021-02-26 ENCOUNTER — Ambulatory Visit: Payer: Federal, State, Local not specified - PPO | Admitting: *Deleted

## 2021-02-26 ENCOUNTER — Other Ambulatory Visit: Payer: Self-pay

## 2021-02-26 DIAGNOSIS — Q2381 Bicuspid aortic valve: Secondary | ICD-10-CM

## 2021-02-26 DIAGNOSIS — Q231 Congenital insufficiency of aortic valve: Secondary | ICD-10-CM

## 2021-02-26 DIAGNOSIS — Z5181 Encounter for therapeutic drug level monitoring: Secondary | ICD-10-CM | POA: Diagnosis not present

## 2021-02-26 LAB — POCT INR: INR: 3.7 — AB (ref 2.0–3.0)

## 2021-02-26 NOTE — Patient Instructions (Addendum)
  Description   Hold today, then Continue same dose 7.5mg  daily except 10mg  on Tuesdays. Recheck INR in 3 weeks. Coumadin Clinic [phone]

## 2021-03-18 ENCOUNTER — Other Ambulatory Visit: Payer: Self-pay

## 2021-03-18 ENCOUNTER — Ambulatory Visit: Payer: Federal, State, Local not specified - PPO | Admitting: *Deleted

## 2021-03-18 DIAGNOSIS — Z5181 Encounter for therapeutic drug level monitoring: Secondary | ICD-10-CM

## 2021-03-18 DIAGNOSIS — Q231 Congenital insufficiency of aortic valve: Secondary | ICD-10-CM | POA: Diagnosis not present

## 2021-03-18 LAB — POCT INR: INR: 2.4 (ref 2.0–3.0)

## 2021-03-18 NOTE — Patient Instructions (Signed)
Description   Continue taking Warfarin 7.5mg  daily except 10mg  on Tuesdays. Recheck INR in 4 weeks. Coumadin Clinic [phone]

## 2021-03-20 ENCOUNTER — Encounter: Payer: Self-pay | Admitting: Gastroenterology

## 2021-03-20 ENCOUNTER — Telehealth: Payer: Self-pay | Admitting: Gastroenterology

## 2021-03-20 NOTE — Telephone Encounter (Signed)
Patient has been having pus and BRBPR for the last two days.  Scheduled 11/22 with Dr. Silverio Decamp, but wanted to know if she could be seen sooner or if there was anything she could do in the meantime.  Please call and advise.  Thank you.

## 2021-03-20 NOTE — Telephone Encounter (Signed)
Spoke with the patient. She is a new patient never seen by [facility]. Reports blood and pus passing from the rectum without stool. States 2 days of this and it has happened before in the past. Denies personal history of IBD or autoimmune conditions and does not know of any family members with this. Reports she has a little food aversion but not to the point of wanting to vomit. Afebrile. Not light-headed, weak or dizzy. She has placed a call to her PCP, Dr Inda Merlin of [facility].  Encouraged the patient to follow up on that phone call. I will monitor the schedule for an appointment sooner. She should go to the ER if she acutely worsens.

## 2021-03-24 ENCOUNTER — Ambulatory Visit: Payer: Federal, State, Local not specified - PPO | Admitting: Gastroenterology

## 2021-03-24 ENCOUNTER — Other Ambulatory Visit (INDEPENDENT_AMBULATORY_CARE_PROVIDER_SITE_OTHER): Payer: Federal, State, Local not specified - PPO

## 2021-03-24 ENCOUNTER — Encounter: Payer: Self-pay | Admitting: Gastroenterology

## 2021-03-24 VITALS — BP 120/72 | HR 65 | Ht 65.0 in | Wt 180.5 lb

## 2021-03-24 DIAGNOSIS — Z7902 Long term (current) use of antithrombotics/antiplatelets: Secondary | ICD-10-CM | POA: Diagnosis not present

## 2021-03-24 DIAGNOSIS — K625 Hemorrhage of anus and rectum: Secondary | ICD-10-CM

## 2021-03-24 LAB — CBC WITH DIFFERENTIAL/PLATELET
Basophils Absolute: 0 10*3/uL (ref 0.0–0.1)
Basophils Relative: 0.8 % (ref 0.0–3.0)
Eosinophils Absolute: 0.1 10*3/uL (ref 0.0–0.7)
Eosinophils Relative: 2.4 % (ref 0.0–5.0)
HCT: 41.3 % (ref 36.0–46.0)
Hemoglobin: 13.9 g/dL (ref 12.0–15.0)
Lymphocytes Relative: 21.7 % (ref 12.0–46.0)
Lymphs Abs: 1 10*3/uL (ref 0.7–4.0)
MCHC: 33.6 g/dL (ref 30.0–36.0)
MCV: 91.4 fl (ref 78.0–100.0)
Monocytes Absolute: 0.4 10*3/uL (ref 0.1–1.0)
Monocytes Relative: 9.2 % (ref 3.0–12.0)
Neutro Abs: 2.9 10*3/uL (ref 1.4–7.7)
Neutrophils Relative %: 65.9 % (ref 43.0–77.0)
Platelets: 211 10*3/uL (ref 150.0–400.0)
RBC: 4.52 Mil/uL (ref 3.87–5.11)
RDW: 13.5 % (ref 11.5–15.5)
WBC: 4.4 10*3/uL (ref 4.0–10.5)

## 2021-03-24 NOTE — Progress Notes (Signed)
[facility] Consult Note:  History: Monica Key 03/24/2021  Referring provider: Josetta Huddle, MD  Reason for consult/chief complaint: Blood In Stools (Since last Thursday, noticed blood in the stool for 3 days. Now normal BM)   Subjective  HPI:  This is a very pleasant 65 year old woman referred by her primary care at [facility] for recent rectal bleeding.  In the last year she has had several episodes of acute onset crampy lower abdominal pain and bloating that then passes within several hours, not necessarily even with passage of gas and she does not get loose stool.  With an episode last week she had bright red blood per rectum which had not occurred previously, it then turned darker and stopped after 3 days.  She has normal bowel movements last 2 days.  She has no anal or rectal pain with bowel movements including with last week's episode.  Denies chronic upper digestive symptoms.  Mirta was originally scheduled to see Dr. Silverio Decamp on 1122, then called yesterday with concerns about the symptoms and was put in with me today as I had first available appointment.  She saw Dr. Amedeo Plenty at [facility] for routine colonoscopies.  She was apparently going to see them several years ago but there was an insurance issue, and that she had a Cologuard done with primary care that she recalls was normal.  ROS:  Review of Systems  Constitutional:  Negative for appetite change and unexpected weight change.  HENT:  Negative for mouth sores and voice change.   Eyes:  Negative for pain and redness.  Respiratory:  Positive for cough. Negative for shortness of breath.   Cardiovascular:  Negative for chest pain and palpitations.  Genitourinary:  Negative for dysuria and hematuria.  Musculoskeletal:  Negative for arthralgias and myalgias.  Skin:  Negative for pallor and rash.  Neurological:  Negative for weakness and headaches.       Tinnitus  Hematological:  Negative for adenopathy.     Past Medical History: Past Medical History:  Diagnosis Date   Diabetes mellitus without complication ([place])    Hypertension    Pinched nerve in neck    Cardiology appointment with Dr. Sherren Mocha in November 2020  Past Surgical History: Past Surgical History:  Procedure Laterality Date   CARDIAC SURGERY     From most recent thoracic surgery office note 10/01/2020: "The patient is a 65 year old woman who returns today for follow-up of an ascending aortic aneurysm following AVR with a Saint Jude mechanical valve in 1999 for severe bicuspid aortic valve stenosis.  I have been following her ascending aortic aneurysm since 2010 with stable measurements between 4.6 to 4.8 cm.  Her most remote CT on 09/19/2005 showed the ascending aorta to measure 4.4 x 4.3 cm.  Since I saw her 1 year ago she continues to do well without chest pain or shortness of breath She has a stable 4.6 cm fusiform ascending aortic aneurysm status post mechanical aortic valve replacement in 1999.  Her aneurysm has been stable since at least 2010.  This is well below the surgical threshold of 5 to 5.5 cm in a patient with bicuspid aortic valve disease.  I reviewed the MRA images with her and answered all of her questions.  I stressed the importance of continued good blood pressure control in preventing further enlargement and acute aortic dissection.   Family History: Family History  Problem Relation Age of Onset   Angina Mother    Alzheimer's disease Mother  Transient ischemic attack Father    Colon cancer Neg Hx    Stomach cancer Neg Hx    Esophageal cancer Neg Hx    Pancreatic cancer Neg Hx    Liver cancer Neg Hx     Social History: Social History   Socioeconomic History   Marital status: Married    Spouse name: Not on file   Number of children: 1   Years of education: Not on file   Highest education level: Not on file  Occupational History   Not on file  Tobacco Use   Smoking status: Never    Smokeless tobacco: Never  Vaping Use   Vaping Use: Never used  Substance and Sexual Activity   Alcohol use: No   Drug use: No   Sexual activity: Not Currently  Other Topics Concern   Not on file  Social History Narrative   Not on file   Social Determinants of Health   Financial Resource Strain: Not on file  Food Insecurity: Not on file  Transportation Needs: Not on file  Physical Activity: Not on file  Stress: Not on file  Social Connections: Not on file    Allergies: Allergies  Allergen Reactions   Hydralazine Hcl Rash   Penicillins Rash   Sulfonamide Derivatives Rash   Vancomycin Rash    Outpatient Meds: Current Outpatient Medications  Medication Sig Dispense Refill   carvedilol (COREG) 6.25 MG tablet TAKE 1 TABLET BY MOUTH TWICE DAILY WITH A MEAL. 180 tablet 3   Cholecalciferol (VITAMIN D) 1000 UNITS capsule Take 1,000 Units by mouth daily.     co-enzyme Q-10 30 MG capsule Take 30 mg by mouth daily.     fluvastatin (LESCOL) 40 MG capsule TAKE 1 CAPSULE AT BEDTIME. 30 capsule 6   losartan (COZAAR) 25 MG tablet TAKE 1 TABLET ONCE DAILY. 90 tablet 3   metFORMIN (GLUCOPHAGE) 500 MG tablet Take 500 mg by mouth 2 (two) times daily with a meal.     Polyethyl Glycol-Propyl Glycol (SYSTANE OP) Apply 1 drop to eye every morning.     warfarin (COUMADIN) 10 MG tablet TAKE AS DIRECTED. 15 tablet 1   warfarin (COUMADIN) 7.5 MG tablet TAKE AS DIRECTED BY COUMADIN CLINIC. 90 tablet 0   aspirin 81 MG EC tablet Take 81 mg by mouth daily. (Patient not taking: Reported on 03/24/2021)     No current facility-administered medications for this visit.      ___________________________________________________________________ Objective   Exam:  BP 120/72   Pulse 65   Ht 5\' 5"  (1.651 m)   Wt 180 lb 8 oz (81.9 kg)   SpO2 98%   BMI 30.04 kg/m  Wt Readings from Last 3 Encounters:  03/24/21 180 lb 8 oz (81.9 kg)  10/01/20 178 lb (80.7 kg)  03/19/20 174 lb (78.9 kg)    General:  Well-appearing Eyes: sclera anicteric, no redness ENT: oral mucosa moist without lesions, no cervical or supraclavicular lymphadenopathy CV: Regular rhythm, valve click, 4 out of 6 systolic murmur no peripheral edema Resp: clear to auscultation bilaterally, normal RR and effort noted GI: soft, no tenderness, with active bowel sounds. No guarding or palpable organomegaly noted. Skin; warm and dry, no rash or jaundice noted Neuro: awake, alert and oriented x 3. Normal gross motor function and fluent speech Rectal: Normal perianal/external exam.  No fissure, tenderness or palpable internal lesion on DRE  Labs:  Lab Results  Component Value Date   INR 2.4 03/18/2021   INR 3.7 (A)  02/26/2021   INR 2.5 01/23/2021   PROTIME 16.5 10/28/2008   No recent CBC in the epic system  Last echocardiogram report 03/14/2019: "1. Left ventricular ejection fraction, by visual estimation, is 55 to  60%. The left ventricle has normal function. Left ventricular septal wall  thickness was moderately increased. There is moderately increased left  ventricular hypertrophy.   2. Global right ventricle has normal systolic function.The right  ventricular size is normal. No increase in right ventricular wall  thickness.   3. Left atrial size was normal.   4. Right atrial size was normal.   5. The mitral valve is normal in structure. Trace mitral valve  regurgitation.   6. The tricuspid valve is normal in structure. Tricuspid valve  regurgitation is mild.   7. Aortic valve regurgitation is not visualized.   8. Post AVR with St Jude mechanical valve no PVL.   9. The pulmonic valve was grossly normal. Pulmonic valve regurgitation is  mild.  10. Aortic dilatation noted.  11. There is moderate to severe dilatation of the aortic root measuring 45  mm.  12. Normal pulmonary artery systolic pressure.    Assessment: Encounter Diagnoses  Name Primary?   Rectal bleeding Yes   Long term (current) use of  antithrombotics/antiplatelets     Recent self-limited rectal bleeding, otherwise feels well and looks well. Distant mechanical AVR, normal LV function, stable cardiac issues on [place].  She had done some research and wondered about the possibility of UC.  Possible, though she does not have chronic diarrhea.  Plan: CBC today to be sure she is not anemic.  Colonoscopy.  Tyronica was agreeable after discussion of procedure and risks.  The benefits and risks of the planned procedure were described in detail with the patient or (when appropriate) their health care proxy.  Risks were outlined as including, but not limited to, bleeding, infection, perforation, adverse medication reaction leading to cardiac or pulmonary decompensation, pancreatitis (if ERCP).  The limitation of incomplete mucosal visualization was also discussed.  No guarantees or warranties were given.  Patient at increased risk for cardiopulmonary complications of procedure due to medical comorbidities.  She needs to hold Coumadin prior to the procedure and we will communicate the anticoagulation clinic for the management of that, including whether or not she needs bridging Lovenox.   Thank you for the courtesy of this consult.  Please call me with any questions or concerns.  Nelida Meuse III  CC: Referring provider noted above

## 2021-03-24 NOTE — Patient Instructions (Signed)
If you are age 67 or older, your body mass index should be between 23-30. Your Body mass index is 30.04 kg/m. If this is out of the aforementioned range listed, please consider follow up with your Primary Care Provider.  If you are age 71 or younger, your body mass index should be between 19-25. Your Body mass index is 30.04 kg/m. If this is out of the aformentioned range listed, please consider follow up with your Primary Care Provider.   ________________________________________________________  The [facility] providers would like to encourage you to use [facility] to communicate with providers for non-urgent requests or questions.  Due to long hold times on the telephone, sending your provider a message by [facility] may be a faster and more efficient way to get a response.  Please allow 48 business hours for a response.  Please remember that this is for non-urgent requests.  _______________________________________________________  Monica Key have been scheduled for a colonoscopy. Please follow written instructions given to you at your visit today.  Please pick up your prep supplies at the pharmacy within the next 1-3 days. If you use inhalers (even only as needed), please bring them with you on the day of your procedure.  Your provider has requested that you go to the basement level for lab work before leaving today. Press "B" on the elevator. The lab is located at the first door on the left as you exit the elevator.  Due to recent changes in healthcare laws, you may see the results of your imaging and laboratory studies on MyChart before your provider has had a chance to review them.  We understand that in some cases there may be results that are confusing or concerning to you. Not all laboratory results come back in the same time frame and the provider may be waiting for multiple results in order to interpret others.  Please give [place] 48 hours in order for your provider to thoroughly review all the results  before contacting the office for clarification of your results.   It was a pleasure to see you today!  Thank you for trusting me with your gastrointestinal care!

## 2021-03-27 ENCOUNTER — Other Ambulatory Visit: Payer: Self-pay

## 2021-03-27 ENCOUNTER — Telehealth: Payer: Self-pay

## 2021-03-27 DIAGNOSIS — Z7902 Long term (current) use of antithrombotics/antiplatelets: Secondary | ICD-10-CM

## 2021-03-27 DIAGNOSIS — K625 Hemorrhage of anus and rectum: Secondary | ICD-10-CM

## 2021-03-27 NOTE — Telephone Encounter (Signed)
   Primary Cardiologist: Sherren Mocha, MD  Chart reviewed as part of pre-operative protocol coverage.  Clinical pharmacist have given the following recommendations for  Monica Key .   Patient with diagnosis of mechanical AVR on warfarin for anticoagulation.     Procedure: colonoscopy Date of procedure: 05/15/21   CrCl 79mL/min using adjusted body weight Platelet count 211K   Per office protocol, patient can hold warfarin for 5 days prior to procedure. Patient will not need bridging with Lovenox (enoxaparin) around procedure.  I will route this recommendation to the requesting party via Epic fax function and remove from pre-op pool.  Please call with questions.  Jossie Ng. Yenni Carra NP-C    03/27/2021, 12:41 PM [place] [address] Office [phone] Fax [phone]

## 2021-03-27 NOTE — Telephone Encounter (Signed)
[facility] Pre-operative Risk Assessment     Request for surgical clearance:     Endoscopy Procedure  What type of surgery is being performed?     Colonoscopy  When is this surgery scheduled?     05-15-2021  What type of clearance is required ?   Pharmacy  Are there any medications that need to be held prior to surgery and how long? Yes, Coumadin  Practice name and name of physician performing surgery?      [facility]  What is your office phone and fax number?      Phone- [phone]  [phone]  Anesthesia type (None, local, MAC, general) ?       MAC

## 2021-03-27 NOTE — Telephone Encounter (Signed)
Patient with diagnosis of mechanical AVR on warfarin for anticoagulation.    Procedure: colonoscopy Date of procedure: 05/15/21  CrCl 59mL/min using adjusted body weight Platelet count 211K  Per office protocol, patient can hold warfarin for 5 days prior to procedure. Patient will not need bridging with Lovenox (enoxaparin) around procedure.

## 2021-03-30 NOTE — Telephone Encounter (Signed)
Patient advised that she has been given clearance to hold warfarin 5 days prior to Colonoscopy scheduled for 05-15-21.  Patient advised to take last dose of warfarin on 05-09-21, and she will be advised when to restart warfarin  by Dr Loletha Carrow after the procedure.  Patient agreed to plan and verbalized understanding.  No further questions.

## 2021-03-31 ENCOUNTER — Ambulatory Visit: Payer: Federal, State, Local not specified - PPO | Admitting: Gastroenterology

## 2021-04-05 ENCOUNTER — Other Ambulatory Visit: Payer: Self-pay | Admitting: Cardiovascular Disease

## 2021-04-05 DIAGNOSIS — Z952 Presence of prosthetic heart valve: Secondary | ICD-10-CM

## 2021-04-08 DIAGNOSIS — Z23 Encounter for immunization: Secondary | ICD-10-CM | POA: Diagnosis not present

## 2021-04-15 ENCOUNTER — Ambulatory Visit: Payer: Federal, State, Local not specified - PPO

## 2021-04-15 ENCOUNTER — Other Ambulatory Visit: Payer: Self-pay

## 2021-04-15 DIAGNOSIS — Z5181 Encounter for therapeutic drug level monitoring: Secondary | ICD-10-CM | POA: Diagnosis not present

## 2021-04-15 DIAGNOSIS — Q231 Congenital insufficiency of aortic valve: Secondary | ICD-10-CM | POA: Diagnosis not present

## 2021-04-15 LAB — POCT INR: INR: 1.8 — AB (ref 2.0–3.0)

## 2021-04-15 NOTE — Patient Instructions (Addendum)
Description   Take 10mg  tomorrow and then continue taking Warfarin 7.5mg  daily except 10mg  on Tuesdays. Recheck INR in 3 weeks. Procedure scheduled for 05/15/21. [facility] [phone]

## 2021-04-21 ENCOUNTER — Other Ambulatory Visit: Payer: Self-pay | Admitting: Cardiovascular Disease

## 2021-05-07 ENCOUNTER — Ambulatory Visit: Payer: Federal, State, Local not specified - PPO | Admitting: *Deleted

## 2021-05-07 ENCOUNTER — Other Ambulatory Visit: Payer: Self-pay

## 2021-05-07 DIAGNOSIS — Q231 Congenital insufficiency of aortic valve: Secondary | ICD-10-CM

## 2021-05-07 DIAGNOSIS — Z5181 Encounter for therapeutic drug level monitoring: Secondary | ICD-10-CM

## 2021-05-07 LAB — POCT INR: INR: 2.4 (ref 2.0–3.0)

## 2021-05-07 NOTE — Patient Instructions (Addendum)
Description   Continue taking Warfarin 7.5mg  daily except 10mg  on Tuesdays. Follow instructions for upcoming procedure. Recheck INR in 3 weeks. Procedure scheduled for 05/15/21. Coumadin Clinic [phone]    Take your last dose of warfarin on 05/09/2021 then start holding for upcoming procedure. Once you restart warfarin take an extra 1/2 tablet with normal dose for two days.

## 2021-05-12 ENCOUNTER — Telehealth: Payer: Self-pay | Admitting: Gastroenterology

## 2021-05-12 ENCOUNTER — Other Ambulatory Visit: Payer: Self-pay

## 2021-05-12 ENCOUNTER — Other Ambulatory Visit: Payer: Self-pay | Admitting: Gastroenterology

## 2021-05-12 ENCOUNTER — Telehealth: Payer: Self-pay | Admitting: Cardiovascular Disease

## 2021-05-12 MED ORDER — PLENVU 140 G PO SOLR: 140.0000 g | ORAL | 0 refills | Status: DC

## 2021-05-12 NOTE — Telephone Encounter (Signed)
Patient called and stated that she needed to cancel procedure due to transportation from [place] would not be able to make it due to car trouble. Patient was rescheduled on 1/26 at 10:30.  Patient is still concerned about prep medication. Please advise.

## 2021-05-12 NOTE — Progress Notes (Signed)
lenvu

## 2021-05-12 NOTE — Telephone Encounter (Signed)
Pt called colonoscopy on 05/15/21 has been postponed to 06/04/21, pt has held Warfarin since 05/10/21, advised to take 10mg  today and tomorrow, then resume same dosage 7.5mg  QD except 10mg  on Tuesdays (boosted an extra 1/2 tablet x 1 dosage since pt has only held 2 dosages).  Will keep appt on 05/21/21 to recheck INR after resuming Warfarin and prior to colonoscopy reschedule date of 06/04/21.  Pt verbalized understanding.

## 2021-05-12 NOTE — Telephone Encounter (Signed)
Patient called stating that pharmacy stated they have not received Prep medication for procedure on 1/5.   Please advise.

## 2021-05-12 NOTE — Telephone Encounter (Signed)
Pt c/o medication issue:  1. Name of Medication:  warfarin (COUMADIN) 10 MG tablet warfarin (COUMADIN) 7.5 MG tablet  2. How are you currently taking this medication (dosage and times per day)? Not currently taking   3. Are you having a reaction (difficulty breathing--STAT)? No   4. What is your medication issue? Monica Key is calling stating she was holding her warfarin due to a colonoscopy that was supposed to be performed today, but was rescheduled. She would like to know how to start back on the coumadin due to this.

## 2021-05-12 NOTE — Telephone Encounter (Signed)
Patient called about her prescription being sent to [facility].  She wants to go ahead and pick it up as her procedure is in a couple of days and her caregiver is driving in from [place] to take her.  She wants to know if she needs to come in and pick up an actual prescription or if she should wait for it to be called in.  Please call patient and let her know this has been called in for her to pick up.  Thank you.

## 2021-05-13 ENCOUNTER — Other Ambulatory Visit: Payer: Self-pay

## 2021-05-13 DIAGNOSIS — Z954 Presence of other heart-valve replacement: Secondary | ICD-10-CM | POA: Diagnosis not present

## 2021-05-13 DIAGNOSIS — Z79899 Other long term (current) drug therapy: Secondary | ICD-10-CM | POA: Diagnosis not present

## 2021-05-13 DIAGNOSIS — R739 Hyperglycemia, unspecified: Secondary | ICD-10-CM | POA: Diagnosis not present

## 2021-05-13 DIAGNOSIS — D6869 Other thrombophilia: Secondary | ICD-10-CM | POA: Diagnosis not present

## 2021-05-13 DIAGNOSIS — E559 Vitamin D deficiency, unspecified: Secondary | ICD-10-CM | POA: Diagnosis not present

## 2021-05-13 DIAGNOSIS — Z0000 Encounter for general adult medical examination without abnormal findings: Secondary | ICD-10-CM | POA: Diagnosis not present

## 2021-05-13 DIAGNOSIS — E785 Hyperlipidemia, unspecified: Secondary | ICD-10-CM | POA: Diagnosis not present

## 2021-05-13 DIAGNOSIS — I712 Thoracic aortic aneurysm, without rupture, unspecified: Secondary | ICD-10-CM | POA: Diagnosis not present

## 2021-05-13 MED ORDER — GOLYTELY 236 G PO SOLR: 4000.0000 mL | Freq: Once | ORAL | 0 refills | Status: AC

## 2021-05-13 NOTE — Telephone Encounter (Signed)
Patient requested a prep changes due to cost of Plenvu. New instructions for Golytely have bene competed and pt is aware to pick up the instructions at the office.

## 2021-05-15 ENCOUNTER — Encounter: Payer: Federal, State, Local not specified - PPO | Admitting: Gastroenterology

## 2021-05-21 ENCOUNTER — Ambulatory Visit (INDEPENDENT_AMBULATORY_CARE_PROVIDER_SITE_OTHER): Payer: Federal, State, Local not specified - PPO | Admitting: *Deleted

## 2021-05-21 ENCOUNTER — Other Ambulatory Visit: Payer: Self-pay

## 2021-05-21 DIAGNOSIS — Z5181 Encounter for therapeutic drug level monitoring: Secondary | ICD-10-CM

## 2021-05-21 DIAGNOSIS — Q231 Congenital insufficiency of aortic valve: Secondary | ICD-10-CM

## 2021-05-21 LAB — POCT INR: INR: 2.6 (ref 2.0–3.0)

## 2021-05-21 NOTE — Patient Instructions (Addendum)
Description   Continue taking Warfarin 7.5mg  daily except 10mg  on Tuesdays. Follow instructions for upcoming procedure. Recheck INR in 1 week post procedure. Procedure scheduled for 06/04/21. Coumadin Clinic [phone]    Last dose of Warfarin 05/29/2021; after procedure take an extra 1/2 tablet for 2 days then resume normal dose.

## 2021-05-31 ENCOUNTER — Other Ambulatory Visit: Payer: Self-pay | Admitting: Cardiovascular Disease

## 2021-06-04 ENCOUNTER — Encounter: Payer: Self-pay | Admitting: Gastroenterology

## 2021-06-04 ENCOUNTER — Ambulatory Visit (AMBULATORY_SURGERY_CENTER): Payer: Federal, State, Local not specified - PPO | Admitting: Gastroenterology

## 2021-06-04 VITALS — BP 109/53 | HR 66 | Temp 97.4°F | Resp 14 | Ht 65.0 in | Wt 180.0 lb

## 2021-06-04 DIAGNOSIS — K625 Hemorrhage of anus and rectum: Secondary | ICD-10-CM

## 2021-06-04 DIAGNOSIS — D123 Benign neoplasm of transverse colon: Secondary | ICD-10-CM

## 2021-06-04 DIAGNOSIS — K6289 Other specified diseases of anus and rectum: Secondary | ICD-10-CM

## 2021-06-04 DIAGNOSIS — D122 Benign neoplasm of ascending colon: Secondary | ICD-10-CM

## 2021-06-04 DIAGNOSIS — K640 First degree hemorrhoids: Secondary | ICD-10-CM | POA: Diagnosis not present

## 2021-06-04 DIAGNOSIS — K5730 Diverticulosis of large intestine without perforation or abscess without bleeding: Secondary | ICD-10-CM

## 2021-06-04 MED ORDER — HYOSCYAMINE SULFATE 0.125 MG SL SUBL: 0.1250 mg | SUBLINGUAL_TABLET | Freq: Four times a day (QID) | SUBLINGUAL | 1 refills | Status: DC | PRN

## 2021-06-04 MED ORDER — SODIUM CHLORIDE 0.9 % IV SOLN: 500.0000 mL | Freq: Once | INTRAVENOUS | Status: DC

## 2021-06-04 NOTE — Progress Notes (Signed)
History and Physical:  This patient presents for endoscopic testing for: Encounter Diagnosis  Name Primary?   Rectal bleeding Yes    Clinical details in 03/24/21 office consult note. No further bleeding since then. Warfarin held last 5 days ROS: Patient denies chest pain or cough   Past Medical History: Past Medical History:  Diagnosis Date   Diabetes mellitus without complication ([place])    Hypertension    Pinched nerve in neck      Past Surgical History: Past Surgical History:  Procedure Laterality Date   CARDIAC SURGERY      Allergies: Allergies  Allergen Reactions   Hydralazine Hcl Rash   Penicillins Rash   Sulfonamide Derivatives Rash   Vancomycin Rash    Outpatient Meds: Current Outpatient Medications  Medication Sig Dispense Refill   aspirin 81 MG EC tablet Take 81 mg by mouth daily.     carvedilol (COREG) 6.25 MG tablet TAKE 1 TABLET BY MOUTH TWICE DAILY WITH A MEAL. 180 tablet 3   Cholecalciferol (VITAMIN D) 1000 UNITS capsule Take 1,000 Units by mouth daily.     fluvastatin (LESCOL) 40 MG capsule TAKE ONE TABLET BY MOUTH AT BEDTIME 30 capsule 1   losartan (COZAAR) 25 MG tablet TAKE 1 TABLET ONCE DAILY. 90 tablet 3   metFORMIN (GLUCOPHAGE) 500 MG tablet Take 500 mg by mouth 2 (two) times daily with a meal.     Polyethyl Glycol-Propyl Glycol (SYSTANE OP) Apply 1 drop to eye every morning.     warfarin (COUMADIN) 10 MG tablet TAKE AS DIRECTED. 15 tablet 1   warfarin (COUMADIN) 7.5 MG tablet TAKE AS DIRECTED BY COUMADIN CLINIC. 90 tablet 0   co-enzyme Q-10 30 MG capsule Take 30 mg by mouth daily.     Current Facility-Administered Medications  Medication Dose Route Frequency Provider Last Rate Last Admin   0.9 %  sodium chloride infusion  500 mL Intravenous Once Danis, Estill Cotta III, MD          ___________________________________________________________________ Objective   Exam:  BP (!) 117/51    Pulse 72    Temp (!) 97.4 F (36.3 C)    Ht 5\' 5"   (1.651 m)    Wt 180 lb (81.6 kg)    SpO2 98%    BMI 29.95 kg/m   CV: RRR without murmur, valve click heard Resp: clear to auscultation bilaterally, normal RR and effort noted GI: soft, no tenderness, with active bowel sounds.   Assessment: Encounter Diagnosis  Name Primary?   Rectal bleeding Yes     Plan: Colonoscopy  The benefits and risks of the planned procedure were described in detail with the patient or (when appropriate) their health care proxy.  Risks were outlined as including, but not limited to, bleeding, infection, perforation, adverse medication reaction leading to cardiac or pulmonary decompensation, pancreatitis (if ERCP).  The limitation of incomplete mucosal visualization was also discussed.  No guarantees or warranties were given.    The patient is appropriate for an endoscopic procedure in the ambulatory setting.   - Wilfrid Lund, MD

## 2021-06-04 NOTE — Progress Notes (Signed)
Pt's states no medical or surgical changes since previsit or office visit.  VS CW  

## 2021-06-04 NOTE — Progress Notes (Signed)
Pt non-responsive, VVS, Report to RN  °

## 2021-06-04 NOTE — Patient Instructions (Signed)
YOU HAD AN ENDOSCOPIC PROCEDURE TODAY AT THE [facility]:   Refer to the procedure report that was given to you for any specific questions about what was found during the examination.  If the procedure report does not answer your questions, please call your gastroenterologist to clarify.  If you requested that your care partner not be given the details of your procedure findings, then the procedure report has been included in a sealed envelope for you to review at your convenience later. ° °**Handouts given on polyps, diverticulosis and hemorrhoids** ° °YOU SHOULD EXPECT: Some feelings of bloating in the abdomen. Passage of more gas than usual.  Walking can help get rid of the air that was put into your GI tract during the procedure and reduce the bloating. If you had a lower endoscopy (such as a colonoscopy or flexible sigmoidoscopy) you may notice spotting of blood in your stool or on the toilet paper. If you underwent a bowel prep for your procedure, you may not have a normal bowel movement for a few days. ° °Please Note:  You might notice some irritation and congestion in your nose or some drainage.  This is from the oxygen used during your procedure.  There is no need for concern and it should clear up in a day or so. ° °SYMPTOMS TO REPORT IMMEDIATELY: ° °Following lower endoscopy (colonoscopy or flexible sigmoidoscopy): ° Excessive amounts of blood in the stool ° Significant tenderness or worsening of abdominal pains ° Swelling of the abdomen that is new, acute ° Fever of 100°F or higher ° °For urgent or emergent issues, a gastroenterologist can be reached at any hour by calling [phone]. °Do not use MyChart messaging for urgent concerns.  ° ° °DIET:  We do recommend a small meal at first, but then you may proceed to your regular diet.  Drink plenty of fluids but you should avoid alcoholic beverages for 24 hours. ° °ACTIVITY:  You should plan to take it easy for the rest of today and you  should NOT DRIVE or use heavy machinery until tomorrow (because of the sedation medicines used during the test).   ° °FOLLOW UP: °Our staff will call the number listed on your records 48-72 hours following your procedure to check on you and address any questions or concerns that you may have regarding the information given to you following your procedure. If we do not reach you, we will leave a message.  We will attempt to reach you two times.  During this call, we will ask if you have developed any symptoms of COVID 19. If you develop any symptoms (ie: fever, flu-like symptoms, shortness of breath, cough etc.) before then, please call [phone].  If you test positive for Covid 19 in the 2 weeks post procedure, please call and report this information to us.   ° °If any biopsies were taken you will be contacted by phone or by letter within the next 1-3 weeks.  Please call us at [phone] if you have not heard about the biopsies in 3 weeks.  ° ° °SIGNATURES/CONFIDENTIALITY: °You and/or your care partner have signed paperwork which will be entered into your electronic medical record.  These signatures attest to the fact that that the information above on your After Visit Summary has been reviewed and is understood.  Full responsibility of the confidentiality of this discharge information lies with you and/or your care-partner.  °

## 2021-06-04 NOTE — Op Note (Signed)
[facility] Patient Name: Monica Key Procedure Date: 06/04/2021 10:07 AM MRN: [id number] Endoscopist: [place]. Loletha Carrow , MD Age: 66 Referring MD:  Date of Birth: 1956/01/12 Gender: Female Account #: [id number] Procedure:                Colonoscopy Indications:              Lower abdominal pain (episodic, without altered                            bowel habits), Rectal bleeding (one episode months                            ago) Medicines:                Monitored Anesthesia Care Procedure:                Pre-Anesthesia Assessment:                           - Prior to the procedure, a History and Physical                            was performed, and patient medications and                            allergies were reviewed. The patient's tolerance of                            previous anesthesia was also reviewed. The risks                            and benefits of the procedure and the sedation                            options and risks were discussed with the patient.                            All questions were answered, and informed consent                            was obtained. Prior Anticoagulants: The patient has                            taken Coumadin (warfarin), last dose was 5 days                            prior to procedure. ASA Grade Assessment: III - A                            patient with severe systemic disease. After                            reviewing the risks and benefits, the patient was  deemed in satisfactory condition to undergo the                            procedure.                           After obtaining informed consent, the colonoscope                            was passed under direct vision. Throughout the                            procedure, the patient's blood pressure, pulse, and                            oxygen saturations were monitored continuously. The                            Olympus  CF-HQ190L (Serial# [id number]) Colonoscope was                            introduced through the anus and advanced to the the                            terminal ileum, with identification of the                            appendiceal orifice and IC valve. The colonoscopy                            was performed with difficulty due to multiple                            diverticula in the colon, a redundant colon and a                            tortuous colon. Successful completion of the                            procedure was aided by using manual pressure,                            straightening and shortening the scope to obtain                            bowel loop reduction and lavage. The patient                            tolerated the procedure well. The quality of the                            bowel preparation was good. The terminal ileum,  ileocecal valve, appendiceal orifice, and rectum                            were photographed. The bowel preparation used was                            GoLYTELY. Scope In: 10:25:09 AM Scope Out: 10:44:11 AM Scope Withdrawal Time: 0 hours 12 minutes 53 seconds  Total Procedure Duration: 0 hours 19 minutes 2 seconds  Findings:                 The perianal and digital rectal examinations were                            normal.                           The terminal ileum appeared normal.                           Repeat examination of right colon under NBI                            performed.                           Two sessile polyps were found in the transverse                            colon and ascending colon. The polyps were                            diminutive in size. These polyps were removed with                            a cold snare. Resection and retrieval were complete.                           Many diverticula were found from sigmoid to the mid                            transverse colon. There  was haustral thickening and                            mild patchy erythema associated with this in the                            sigmoid colon.                           Internal hemorrhoids were found. The hemorrhoids                            were Grade I (internal hemorrhoids that do not  prolapse). Hypertrophied anal papillae seen.                           The exam was otherwise without abnormality on                            direct and retroflexion views. Complications:            No immediate complications. Estimated Blood Loss:     Estimated blood loss was minimal. Impression:               Bleeding was most likely hemorrhoidal, less likely                            diverticular.                           - The examined portion of the ileum was normal.                           - Two diminutive polyps in the transverse colon and                            in the ascending colon, removed with a cold snare.                            Resected and retrieved.                           - Diverticulosis from sigmoid to transverse colon.                           - Internal hemorrhoids.                           - The examination was otherwise normal on direct                            and retroflexion views. Recommendation:           - Patient has a contact number available for                            emergencies. The signs and symptoms of potential                            delayed complications were discussed with the                            patient. Return to normal activities tomorrow.                            Written discharge instructions were provided to the                            patient.                           -  Resume previous diet.                           - Resume Coumadin (warfarin) at prior dose today.                            Refer to Coumadin Clinic for further adjustment of                            therapy.                            - Await pathology results.                           - Repeat colonoscopy is recommended for                            surveillance. The colonoscopy date will be                            determined after pathology results from today's                            exam become available for review.                           - Use Levsin, NuLev (hyoscyamine) 0.125 mg 1 tablet                            PO q 6 hours as needed for lower abdominal pain.                           - Return to my office PRN. Duwayne Matters L. Loletha Carrow, MD 06/04/2021 10:55:34 AM This report has been signed electronically.

## 2021-06-04 NOTE — Progress Notes (Signed)
Called to room to assist during endoscopic procedure.  Patient ID and intended procedure confirmed with present staff. Received instructions for my participation in the procedure from the performing physician.  

## 2021-06-08 ENCOUNTER — Telehealth: Payer: Self-pay

## 2021-06-08 NOTE — Telephone Encounter (Signed)
°  Follow up Call-  Call back number 06/04/2021  Post procedure Call Back phone  # [phone]  Permission to leave phone message Yes  Some recent data might be hidden     Patient questions:  Do you have a fever, pain , or abdominal swelling? No. Pain Score  0 *  Have you tolerated food without any problems? Yes.    Have you been able to return to your normal activities? Yes.    Do you have any questions about your discharge instructions: Diet   No. Medications  No. Follow up visit  No.  Do you have questions or concerns about your Care? No.  Actions: * If pain score is 4 or above: No action needed, pain <4.

## 2021-06-10 ENCOUNTER — Ambulatory Visit: Payer: Federal, State, Local not specified - PPO | Admitting: Cardiovascular Disease

## 2021-06-10 ENCOUNTER — Encounter: Payer: Self-pay | Admitting: Gastroenterology

## 2021-06-10 ENCOUNTER — Ambulatory Visit (INDEPENDENT_AMBULATORY_CARE_PROVIDER_SITE_OTHER): Payer: Federal, State, Local not specified - PPO

## 2021-06-10 ENCOUNTER — Other Ambulatory Visit: Payer: Self-pay

## 2021-06-10 ENCOUNTER — Encounter: Payer: Self-pay | Admitting: Cardiovascular Disease

## 2021-06-10 VITALS — BP 100/60 | HR 57 | Ht 65.5 in | Wt 175.8 lb

## 2021-06-10 DIAGNOSIS — Q231 Congenital insufficiency of aortic valve: Secondary | ICD-10-CM | POA: Diagnosis not present

## 2021-06-10 DIAGNOSIS — Z5181 Encounter for therapeutic drug level monitoring: Secondary | ICD-10-CM | POA: Diagnosis not present

## 2021-06-10 DIAGNOSIS — E782 Mixed hyperlipidemia: Secondary | ICD-10-CM | POA: Diagnosis not present

## 2021-06-10 DIAGNOSIS — I359 Nonrheumatic aortic valve disorder, unspecified: Secondary | ICD-10-CM

## 2021-06-10 DIAGNOSIS — I7121 Aneurysm of the ascending aorta, without rupture: Secondary | ICD-10-CM | POA: Diagnosis not present

## 2021-06-10 DIAGNOSIS — I10 Essential (primary) hypertension: Secondary | ICD-10-CM

## 2021-06-10 LAB — POCT INR: INR: 2.7 (ref 2.0–3.0)

## 2021-06-10 MED ORDER — TELMISARTAN 20 MG PO TABS: 20.0000 mg | ORAL_TABLET | Freq: Every day | ORAL | 3 refills | Status: DC

## 2021-06-10 MED ORDER — ROSUVASTATIN CALCIUM 10 MG PO TABS: 10.0000 mg | ORAL_TABLET | Freq: Every day | ORAL | 3 refills | Status: DC

## 2021-06-10 NOTE — Patient Instructions (Signed)
Description   Continue taking Warfarin 7.5mg  daily except 10mg  on Tuesdays. Recheck INR in 3 weeks. Coumadin Clinic [phone]

## 2021-06-10 NOTE — Progress Notes (Signed)
Cardiology Office Note:    Date:  06/10/2021   ID:  Monica Key, DOB 1956-03-20, MRN [id number]  PCP:  Josetta Huddle, MD   [facility] Providers Cardiologist:  Sherren Mocha, MD     Referring MD: Josetta Huddle, MD   Chief Complaint  Patient presents with   Follow-up    Aortic valve disease    History of Present Illness:    Monica Key is a 66 y.o. female with a hx of mechanical aortic valve replacement in 1999.  She is followed for an ascending aortic aneurysm with maximal diameter approximately 4.6 cm.  She has undergone annual MRA studies and sees Dr. Cyndia Bent.  She is here alone today.  Her primary concern relates to a family history of dementia.  She has noticed some very mild cognitive decline and is concerned about some of her medications.  We discussed this today and are making some changes.  Please see below for further details.  From a cardiac perspective she is doing well.  She denies chest pain, chest pressure, shortness of breath, edema, orthopnea, or PND.  She has had no bleeding problems on chronic warfarin.  Past Medical History:  Diagnosis Date   Diabetes mellitus without complication ([place])    Hypertension    Pinched nerve in neck     Past Surgical History:  Procedure Laterality Date   CARDIAC SURGERY      Current Medications: Current Meds  Medication Sig   carvedilol (COREG) 6.25 MG tablet TAKE 1 TABLET BY MOUTH TWICE DAILY WITH A MEAL.   Cholecalciferol (VITAMIN D) 1000 UNITS capsule Take 1,000 Units by mouth daily.   co-enzyme Q-10 30 MG capsule Take 30 mg by mouth daily.   hyoscyamine (LEVSIN SL) 0.125 MG SL tablet Place 1 tablet (0.125 mg total) under the tongue every 6 (six) hours as needed for cramping.   melatonin 5 MG TABS 5 mg. Taking at bedtime   metFORMIN (GLUCOPHAGE) 500 MG tablet Take 500 mg by mouth 2 (two) times daily with a meal.   Polyethyl Glycol-Propyl Glycol (SYSTANE OP) Apply 1 drop to eye every morning.   rosuvastatin (CRESTOR)  10 MG tablet Take 1 tablet (10 mg total) by mouth daily.   telmisartan (MICARDIS) 20 MG tablet Take 1 tablet (20 mg total) by mouth daily.   warfarin (COUMADIN) 10 MG tablet TAKE AS DIRECTED.   warfarin (COUMADIN) 7.5 MG tablet TAKE AS DIRECTED BY COUMADIN CLINIC.   [DISCONTINUED] aspirin 81 MG EC tablet Take 81 mg by mouth daily.   [DISCONTINUED] fluvastatin (LESCOL) 40 MG capsule TAKE ONE TABLET BY MOUTH AT BEDTIME   [DISCONTINUED] losartan (COZAAR) 25 MG tablet TAKE 1 TABLET ONCE DAILY.     Allergies:   Hydralazine hcl, Penicillins, Sulfonamide derivatives, and Vancomycin   Social History   Socioeconomic History   Marital status: Married    Spouse name: Not on file   Number of children: 1   Years of education: Not on file   Highest education level: Not on file  Occupational History   Not on file  Tobacco Use   Smoking status: Never   Smokeless tobacco: Never  Vaping Use   Vaping Use: Never used  Substance and Sexual Activity   Alcohol use: No   Drug use: No   Sexual activity: Not Currently  Other Topics Concern   Not on file  Social History Narrative   Not on file   Social Determinants of Health   Financial Resource  Strain: Not on file  Food Insecurity: Not on file  Transportation Needs: Not on file  Physical Activity: Not on file  Stress: Not on file  Social Connections: Not on file     Family History: The patient's family history includes Alzheimer's disease in her mother; Angina in her mother; Transient ischemic attack in her father. There is no history of Colon cancer, Stomach cancer, Esophageal cancer, Pancreatic cancer, or Liver cancer.  ROS:   Please see the history of present illness.    All other systems reviewed and are negative.  EKGs/Labs/Other Studies Reviewed:    The following studies were reviewed today: Echo 03/14/19:  1. Left ventricular ejection fraction, by visual estimation, is 55 to  60%. The left ventricle has normal function. Left  ventricular septal wall  thickness was moderately increased. There is moderately increased left  ventricular hypertrophy.   2. Global right ventricle has normal systolic function.The right  ventricular size is normal. No increase in right ventricular wall  thickness.   3. Left atrial size was normal.   4. Right atrial size was normal.   5. The mitral valve is normal in structure. Trace mitral valve  regurgitation.   6. The tricuspid valve is normal in structure. Tricuspid valve  regurgitation is mild.   7. Aortic valve regurgitation is not visualized.   8. Post AVR with [employer] Jude mechanical valve no PVL.   9. The pulmonic valve was grossly normal. Pulmonic valve regurgitation is  mild.  10. Aortic dilatation noted.  11. There is moderate to severe dilatation of the aortic root measuring 45  mm.  12. Normal pulmonary artery systolic pressure.   CT Angio Chest 2021: IMPRESSION: 1. Stable fusiform aneurysm of the ascending thoracic aorta measuring 4.6 cm. Aortic aneurysm NOS (ICD10-I71.9). 2.  Aortic Atherosclerosis (ICD10-I70.0). 3. No acute chest abnormality.  MRA chest 4/22: IMPRESSION: Stable fusiform aneurysm of the ascending thoracic aorta measuring up to 4.6 cm.  EKG:  EKG is ordered today.  The ekg ordered today demonstrates sinus bradycardia 57 bpm, nonspecific ST changes noted  Recent Labs: 03/24/2021: Hemoglobin 13.9; Platelets 211.0  Recent Lipid Panel    Component Value Date/Time   CHOL 169 01/16/2016 0835   CHOL 133 12/12/2012 0743   TRIG 105 01/16/2016 0835   TRIG 96 12/12/2012 0743   HDL 46 01/16/2016 0835   HDL 45 12/12/2012 0743   CHOLHDL 3.7 01/16/2016 0835   VLDL 21 01/16/2016 0835   LDLCALC 102 01/16/2016 0835   LDLCALC 69 12/12/2012 0743   LDLDIRECT 151.7 06/21/2006 0902     Risk Assessment/Calculations:           Physical Exam:    VS:  BP 100/60    Pulse (!) 57    Ht 5' 5.5" (1.664 m)    Wt 175 lb 12.8 oz (79.7 kg)    SpO2 96%    BMI  28.81 kg/m     Wt Readings from Last 3 Encounters:  06/10/21 175 lb 12.8 oz (79.7 kg)  06/04/21 180 lb (81.6 kg)  03/24/21 180 lb 8 oz (81.9 kg)     GEN:  Well nourished, well developed in no acute distress HEENT: Normal NECK: No JVD; No carotid bruits LYMPHATICS: No lymphadenopathy CARDIAC: RRR, 2/6 ejection murmur at the right upper sternal border, normal mechanical A2 RESPIRATORY:  Clear to auscultation without rales, wheezing or rhonchi  ABDOMEN: Soft, non-tender, non-distended MUSCULOSKELETAL:  No edema; No deformity  SKIN: Warm and dry NEUROLOGIC:  Alert and oriented x 3 PSYCHIATRIC:  Normal affect   ASSESSMENT:    1. Aneurysm of ascending aorta without rupture   2. Essential hypertension   3. Mixed hyperlipidemia   4. Aortic valve disorder    PLAN:    In order of problems listed above:  Stable findings on most recent chest MRA.  Patient sees Dr. Cyndia Bent on an annual basis. Blood pressure is well controlled.  We will switch her to telmisartan.  She will stop losartan. Has been treated with fluvastatin.  Changed to rosuvastatin 10 mg daily.  Follow-up lipids and LFTs in 3 months. The patient has had normal function of her mechanical aortic valve.  I will repeat an echocardiogram next year at the time of her return office visit.           Medication Adjustments/Labs and Tests Ordered: Current medicines are reviewed at length with the patient today.  Concerns regarding medicines are outlined above.  Orders Placed This Encounter  Procedures   Hepatic function panel   Lipid panel   EKG 12-Lead   ECHOCARDIOGRAM COMPLETE   Meds ordered this encounter  Medications   rosuvastatin (CRESTOR) 10 MG tablet    Sig: Take 1 tablet (10 mg total) by mouth daily.    Dispense:  90 tablet    Refill:  3   telmisartan (MICARDIS) 20 MG tablet    Sig: Take 1 tablet (20 mg total) by mouth daily.    Dispense:  90 tablet    Refill:  3    Patient Instructions  Medication  Instructions:  STOP Fluvastatin STOP Losartan STOP Aspirin START Crestor (Rosuvastatin) START Micardis (Telmisartan) *If you need a refill on your cardiac medications before your next appointment, please call your pharmacy*   Lab Work: Lipids and LFT in 3-4 months If you have labs (blood work) drawn today and your tests are completely normal, you will receive your results only by: [place] (if you have MyChart) OR A paper copy in the mail If you have any lab test that is abnormal or we need to change your treatment, we will call you to review the results.   Testing/Procedures: ECHO in 1 year (same day appt)   Follow-Up: At [facility], you and your health needs are our priority.  As part of our continuing mission to provide you with exceptional heart care, we have created designated Provider Care Teams.  These Care Teams include your primary Cardiologist (physician) and Advanced Practice Providers (APPs -  Physician Assistants and Nurse Practitioners) who all work together to provide you with the care you need, when you need it.   Your next appointment:   1 year(s)  The format for your next appointment:   In Person  Provider:   Sherren Mocha, MD     Other Instructions Thank you for allowing myself and [facility] to serve you, God Bless!!     Signed, Sherren Mocha, MD  06/10/2021 5:28 PM    [place]

## 2021-06-10 NOTE — Patient Instructions (Signed)
Medication Instructions:  STOP Fluvastatin STOP Losartan STOP Aspirin START Crestor (Rosuvastatin) START Micardis (Telmisartan) *If you need a refill on your cardiac medications before your next appointment, please call your pharmacy*   Lab Work: Lipids and LFT in 3-4 months If you have labs (blood work) drawn today and your tests are completely normal, you will receive your results only by: [place] (if you have MyChart) OR A paper copy in the mail If you have any lab test that is abnormal or we need to change your treatment, we will call you to review the results.   Testing/Procedures: ECHO in 1 year (same day appt)   Follow-Up: At [facility], you and your health needs are our priority.  As part of our continuing mission to provide you with exceptional heart care, we have created designated Provider Care Teams.  These Care Teams include your primary Cardiologist (physician) and Advanced Practice Providers (APPs -  Physician Assistants and Nurse Practitioners) who all work together to provide you with the care you need, when you need it.   Your next appointment:   1 year(s)  The format for your next appointment:   In Person  Provider:   Sherren Mocha, MD     Other Instructions Thank you for allowing myself and [facility] to serve you, God Bless!!

## 2021-06-18 DIAGNOSIS — Z20822 Contact with and (suspected) exposure to covid-19: Secondary | ICD-10-CM | POA: Diagnosis not present

## 2021-07-01 ENCOUNTER — Other Ambulatory Visit: Payer: Self-pay

## 2021-07-01 ENCOUNTER — Ambulatory Visit: Payer: Federal, State, Local not specified - PPO | Admitting: *Deleted

## 2021-07-01 DIAGNOSIS — Q231 Congenital insufficiency of aortic valve: Secondary | ICD-10-CM | POA: Diagnosis not present

## 2021-07-01 DIAGNOSIS — Z5181 Encounter for therapeutic drug level monitoring: Secondary | ICD-10-CM | POA: Diagnosis not present

## 2021-07-01 LAB — POCT INR: INR: 2.6 (ref 2.0–3.0)

## 2021-07-01 NOTE — Patient Instructions (Signed)
Description   Continue taking Warfarin 7.5mg  daily except 10mg  on Tuesdays. Recheck INR in 4 weeks. Coumadin Clinic [phone]

## 2021-07-10 DIAGNOSIS — E663 Overweight: Secondary | ICD-10-CM | POA: Diagnosis not present

## 2021-07-10 DIAGNOSIS — E785 Hyperlipidemia, unspecified: Secondary | ICD-10-CM | POA: Diagnosis not present

## 2021-07-17 ENCOUNTER — Telehealth: Payer: Self-pay | Admitting: Cardiovascular Disease

## 2021-07-17 DIAGNOSIS — L723 Sebaceous cyst: Secondary | ICD-10-CM | POA: Diagnosis not present

## 2021-07-17 DIAGNOSIS — H04123 Dry eye syndrome of bilateral lacrimal glands: Secondary | ICD-10-CM | POA: Diagnosis not present

## 2021-07-17 DIAGNOSIS — E119 Type 2 diabetes mellitus without complications: Secondary | ICD-10-CM | POA: Diagnosis not present

## 2021-07-17 DIAGNOSIS — H35363 Drusen (degenerative) of macula, bilateral: Secondary | ICD-10-CM | POA: Diagnosis not present

## 2021-07-17 NOTE — Telephone Encounter (Signed)
Pt called stating that she has been taking Crestor for 2 weeks now without issue, but yesterday when getting up from eating her lunch, she experieced sudden deep, weak, stiffness in her L leg that made her feel as if she would fall over. Pt states that she was able to hobble around on her leg, but that it bothered her all evening and currently at time of call, the sensation is better, but leg still feels "funny." Pt able to walk normally, is ambulating while on phone, and able to bed toes back towards shin of L leg with no pain. Denies swelling in extremity. No redness, warmth, or localized areas of pain noted. Pt also denies pallor, tingling, or cyanosis in extremity. Denies any changes to mentation, speech ability, or vision. States she saw her eye doctor this am for scheduled visit and no major changes. Pt takes Warfarin and last INR was therapeutic on 2/22-denies any missed doses. No CP or SOB. ? ?Went to [facility] Clinical research associate) to address and he feels that this is not neurological or cardiac related. Advised that pt use Tylenol to treat pain and send note to primary cardiologist for review. ? ?Pt informed of recommendation. Pt states that she feels it's likely from the Crestor, so she didn't take her dose last night. Says the improvement today in her symptoms reinforces this belief. Advised pt to try the tylenol for pain and to remain off Rosuvastatin for no more than 1 week to see if symptoms completely resolve, then to contact our office with report, and will likely be advised to go back on to verify if it truly is stemming from the medication. Pt agrees. ? ?Will route to Burt Knack, MD to make aware and see if any additional recommendations are given. ?

## 2021-07-17 NOTE — Telephone Encounter (Signed)
Pt c/o medication issue: ? ?1. Name of Medication: rosuvastatin (CRESTOR) 10 MG tablet ? ?2. How are you currently taking this medication (dosage and times per day)? Take 1 tablet (10 mg total) by mouth daily. ? ?3. Are you having a reaction (difficulty breathing--STAT)?  ? ?4. What is your medication issue? Patient called saying that her left leg starting giving out on her yesterday after lunch. She is wondering if it has something to do with the medication.   ?

## 2021-07-19 NOTE — Telephone Encounter (Signed)
Description of symptoms with sudden onset when getting up from sitting is not consistent with statin related pain. I would go back on crestor, and if recurrent symptoms we can change medication in the future. Again, I don't think the 2 things are related based on how symptoms are described. thanks ?

## 2021-07-20 NOTE — Telephone Encounter (Signed)
Left message with pt (per DPR) with Dr Antionette Char recommendation to resume taking Crestor and let us know if issues arise in the future. ?

## 2021-07-26 ENCOUNTER — Other Ambulatory Visit: Payer: Self-pay | Admitting: Cardiovascular Disease

## 2021-07-26 DIAGNOSIS — Z952 Presence of prosthetic heart valve: Secondary | ICD-10-CM

## 2021-07-28 DIAGNOSIS — G8312 Monoplegia of lower limb affecting left dominant side: Secondary | ICD-10-CM | POA: Diagnosis not present

## 2021-07-29 ENCOUNTER — Other Ambulatory Visit: Payer: Self-pay

## 2021-07-29 ENCOUNTER — Ambulatory Visit: Payer: Federal, State, Local not specified - PPO | Admitting: *Deleted

## 2021-07-29 DIAGNOSIS — Z5181 Encounter for therapeutic drug level monitoring: Secondary | ICD-10-CM | POA: Diagnosis not present

## 2021-07-29 DIAGNOSIS — Q231 Congenital insufficiency of aortic valve: Secondary | ICD-10-CM | POA: Diagnosis not present

## 2021-07-29 LAB — POCT INR: INR: 2.9 (ref 2.0–3.0)

## 2021-07-29 NOTE — Patient Instructions (Signed)
Description   ?Tomorrow take 1/2 tablet then continue taking Warfarin 7.'5mg'$  daily except '10mg'$  on Tuesdays. Recheck INR on Tuesday-on Prednisone (normally 4 weeks). Coumadin Clinic [phone] ?  ?  ?

## 2021-07-31 DIAGNOSIS — M25562 Pain in left knee: Secondary | ICD-10-CM | POA: Diagnosis not present

## 2021-08-03 ENCOUNTER — Ambulatory Visit: Payer: Federal, State, Local not specified - PPO

## 2021-08-03 ENCOUNTER — Other Ambulatory Visit: Payer: Self-pay

## 2021-08-03 DIAGNOSIS — Z5181 Encounter for therapeutic drug level monitoring: Secondary | ICD-10-CM

## 2021-08-03 DIAGNOSIS — Q231 Congenital insufficiency of aortic valve: Secondary | ICD-10-CM | POA: Diagnosis not present

## 2021-08-03 LAB — POCT INR: INR: 2.4 (ref 2.0–3.0)

## 2021-08-03 NOTE — Patient Instructions (Signed)
Description   ?Tomorrow take 1/2 tablet then continue taking Warfarin 7.'5mg'$  daily except '10mg'$  on Tuesdays. Recheck INR on Monday-on Prednisone (normally 4 weeks). [facility]in Clinic [phone] ?  ?   ?

## 2021-08-10 ENCOUNTER — Other Ambulatory Visit: Payer: Self-pay | Admitting: Cardiovascular Disease

## 2021-08-10 ENCOUNTER — Ambulatory Visit: Payer: Federal, State, Local not specified - PPO

## 2021-08-10 DIAGNOSIS — Z952 Presence of prosthetic heart valve: Secondary | ICD-10-CM

## 2021-08-10 DIAGNOSIS — Q231 Congenital insufficiency of aortic valve: Secondary | ICD-10-CM | POA: Diagnosis not present

## 2021-08-10 DIAGNOSIS — Z5181 Encounter for therapeutic drug level monitoring: Secondary | ICD-10-CM | POA: Diagnosis not present

## 2021-08-10 LAB — POCT INR: INR: 2.1 (ref 2.0–3.0)

## 2021-08-10 NOTE — Patient Instructions (Signed)
Description   ?Continue taking Warfarin 7.'5mg'$  daily except '10mg'$  on Tuesdays. Recheck INR in 4 weeks. Coumadin Clinic [phone] ?  ?  ?

## 2021-08-13 DIAGNOSIS — M25562 Pain in left knee: Secondary | ICD-10-CM | POA: Diagnosis not present

## 2021-08-13 DIAGNOSIS — M5416 Radiculopathy, lumbar region: Secondary | ICD-10-CM | POA: Diagnosis not present

## 2021-08-18 ENCOUNTER — Other Ambulatory Visit: Payer: Self-pay | Admitting: *Deleted

## 2021-08-18 DIAGNOSIS — I7121 Aneurysm of the ascending aorta, without rupture: Secondary | ICD-10-CM

## 2021-08-19 ENCOUNTER — Ambulatory Visit
Admission: RE | Admit: 2021-08-19 | Discharge: 2021-08-19 | Disposition: A | Discharge status: Home / Self Care | Payer: Federal, State, Local not specified - PPO | Source: Ambulatory Visit | Attending: Surgery | Admitting: Surgery

## 2021-08-19 DIAGNOSIS — M25562 Pain in left knee: Secondary | ICD-10-CM | POA: Diagnosis not present

## 2021-08-19 DIAGNOSIS — I7121 Aneurysm of the ascending aorta, without rupture: Secondary | ICD-10-CM

## 2021-08-19 DIAGNOSIS — M5416 Radiculopathy, lumbar region: Secondary | ICD-10-CM | POA: Diagnosis not present

## 2021-08-19 MED ORDER — GADOBENATE DIMEGLUMINE 529 MG/ML IV SOLN
15.0000 mL | Freq: Once | INTRAVENOUS | Status: AC | PRN
  Administered 2021-08-19: 15 mL via INTRAVENOUS

## 2021-08-26 ENCOUNTER — Other Ambulatory Visit (HOSPITAL_COMMUNITY)
Admission: RE | Admit: 2021-08-26 | Discharge: 2021-08-26 | Disposition: A | Discharge status: Home / Self Care | Payer: Federal, State, Local not specified - PPO | Source: Ambulatory Visit | Attending: Nurse Practitioner | Admitting: Nurse Practitioner

## 2021-08-26 ENCOUNTER — Other Ambulatory Visit: Payer: Self-pay | Admitting: Nurse Practitioner

## 2021-08-26 DIAGNOSIS — R102 Pelvic and perineal pain: Secondary | ICD-10-CM | POA: Diagnosis not present

## 2021-08-26 DIAGNOSIS — M5416 Radiculopathy, lumbar region: Secondary | ICD-10-CM | POA: Diagnosis not present

## 2021-08-26 DIAGNOSIS — Z124 Encounter for screening for malignant neoplasm of cervix: Secondary | ICD-10-CM | POA: Insufficient documentation

## 2021-08-26 DIAGNOSIS — M25562 Pain in left knee: Secondary | ICD-10-CM | POA: Diagnosis not present

## 2021-08-26 DIAGNOSIS — Z01419 Encounter for gynecological examination (general) (routine) without abnormal findings: Secondary | ICD-10-CM | POA: Diagnosis not present

## 2021-08-28 ENCOUNTER — Other Ambulatory Visit: Payer: Self-pay | Admitting: Nurse Practitioner

## 2021-08-28 DIAGNOSIS — M8580 Other specified disorders of bone density and structure, unspecified site: Secondary | ICD-10-CM

## 2021-08-28 LAB — CYTOLOGY - PAP
Comment: NEGATIVE
Diagnosis: NEGATIVE
High risk HPV: NEGATIVE

## 2021-08-31 ENCOUNTER — Other Ambulatory Visit: Payer: Self-pay | Admitting: Nurse Practitioner

## 2021-08-31 DIAGNOSIS — Z1231 Encounter for screening mammogram for malignant neoplasm of breast: Secondary | ICD-10-CM

## 2021-09-01 DIAGNOSIS — M5451 Vertebrogenic low back pain: Secondary | ICD-10-CM | POA: Diagnosis not present

## 2021-09-07 ENCOUNTER — Ambulatory Visit: Payer: Federal, State, Local not specified - PPO | Admitting: *Deleted

## 2021-09-07 ENCOUNTER — Other Ambulatory Visit: Payer: Federal, State, Local not specified - PPO

## 2021-09-07 ENCOUNTER — Other Ambulatory Visit: Payer: Federal, State, Local not specified - PPO | Admitting: *Deleted

## 2021-09-07 DIAGNOSIS — Z5181 Encounter for therapeutic drug level monitoring: Secondary | ICD-10-CM

## 2021-09-07 DIAGNOSIS — E782 Mixed hyperlipidemia: Secondary | ICD-10-CM

## 2021-09-07 DIAGNOSIS — I7121 Aneurysm of the ascending aorta, without rupture: Secondary | ICD-10-CM | POA: Diagnosis not present

## 2021-09-07 DIAGNOSIS — Q231 Congenital insufficiency of aortic valve: Secondary | ICD-10-CM

## 2021-09-07 DIAGNOSIS — I10 Essential (primary) hypertension: Secondary | ICD-10-CM

## 2021-09-07 DIAGNOSIS — I359 Nonrheumatic aortic valve disorder, unspecified: Secondary | ICD-10-CM | POA: Diagnosis not present

## 2021-09-07 LAB — LIPID PANEL
Chol/HDL Ratio: 2.7 ratio (ref 0.0–4.4)
Cholesterol, Total: 142 mg/dL (ref 100–199)
HDL: 52 mg/dL (ref >39)
LDL Chol Calc (NIH): 69 mg/dL (ref 0–99)
Triglycerides: 114 mg/dL (ref 0–149)
VLDL Cholesterol Cal: 21 mg/dL (ref 5–40)

## 2021-09-07 LAB — HEPATIC FUNCTION PANEL
ALT: 14 IU/L (ref 0–32)
AST: 20 IU/L (ref 0–40)
Albumin: 4.2 g/dL (ref 3.8–4.8)
Alkaline Phosphatase: 79 IU/L (ref 44–121)
Bilirubin Total: 1.4 mg/dL — ABNORMAL HIGH (ref 0.0–1.2)
Bilirubin, Direct: 0.32 mg/dL (ref 0.00–0.40)
Total Protein: 6.2 g/dL (ref 6.0–8.5)

## 2021-09-07 LAB — POCT INR: INR: 2.5 (ref 2.0–3.0)

## 2021-09-07 NOTE — Patient Instructions (Signed)
Description   ?Continue taking Warfarin 7.'5mg'$  daily except '10mg'$  on Tuesdays. Recheck INR in 6 weeks. Coumadin Clinic [phone] ?  ?  ?

## 2021-09-08 DIAGNOSIS — M5451 Vertebrogenic low back pain: Secondary | ICD-10-CM | POA: Diagnosis not present

## 2021-09-10 DIAGNOSIS — M5416 Radiculopathy, lumbar region: Secondary | ICD-10-CM | POA: Diagnosis not present

## 2021-09-11 DIAGNOSIS — R102 Pelvic and perineal pain: Secondary | ICD-10-CM | POA: Diagnosis not present

## 2021-09-15 DIAGNOSIS — M5416 Radiculopathy, lumbar region: Secondary | ICD-10-CM | POA: Diagnosis not present

## 2021-09-15 DIAGNOSIS — M25562 Pain in left knee: Secondary | ICD-10-CM | POA: Diagnosis not present

## 2021-09-15 DIAGNOSIS — M5451 Vertebrogenic low back pain: Secondary | ICD-10-CM | POA: Diagnosis not present

## 2021-09-29 DIAGNOSIS — R1031 Right lower quadrant pain: Secondary | ICD-10-CM | POA: Diagnosis not present

## 2021-10-07 ENCOUNTER — Ambulatory Visit: Payer: Federal, State, Local not specified - PPO | Admitting: Surgery

## 2021-10-07 ENCOUNTER — Encounter: Payer: Self-pay | Admitting: Surgery

## 2021-10-07 VITALS — BP 131/76 | HR 61 | Resp 20 | Ht 65.0 in | Wt 175.0 lb

## 2021-10-07 DIAGNOSIS — I7121 Aneurysm of the ascending aorta, without rupture: Secondary | ICD-10-CM | POA: Diagnosis not present

## 2021-10-07 NOTE — Progress Notes (Signed)
HPI:  The patient is a 66 year old woman who returns for follow-up of a 4.6 cm fusiform ascending aortic aneurysm following aortic valve replacement with a [employer] Jude mechanical valve in 1999 for severe bicuspid aortic valve stenosis.  I have been following her ascending aortic aneurysm since 2010 with stable measurements between 4.6 and 4.8 cm.  She continues to do well without chest pain or shortness of breath.  Current Outpatient Medications  Medication Sig Dispense Refill   carvedilol (COREG) 6.25 MG tablet TAKE 1 TABLET BY MOUTH TWICE DAILY WITH A MEAL. 180 tablet 3   Cholecalciferol (VITAMIN D) 1000 UNITS capsule Take 1,000 Units by mouth daily.     co-enzyme Q-10 30 MG capsule Take 30 mg by mouth daily.     hyoscyamine (LEVSIN SL) 0.125 MG SL tablet Place 1 tablet (0.125 mg total) under the tongue every 6 (six) hours as needed for cramping. 30 tablet 1   melatonin 5 MG TABS 5 mg. Taking at bedtime     metFORMIN (GLUCOPHAGE) 500 MG tablet Take 500 mg by mouth 2 (two) times daily with a meal.     Polyethyl Glycol-Propyl Glycol (SYSTANE OP) Apply 1 drop to eye every morning.     rosuvastatin (CRESTOR) 10 MG tablet Take 1 tablet (10 mg total) by mouth daily. 90 tablet 3   telmisartan (MICARDIS) 20 MG tablet Take 1 tablet (20 mg total) by mouth daily. 90 tablet 3   warfarin (COUMADIN) 10 MG tablet TAKE AS DIRECTED BY ANTICOAGULATION CLINIC 15 tablet 1   warfarin (COUMADIN) 7.5 MG tablet Take 7.'5mg'$  daily by mouth except '10mg'$  on Tuesdays or as directed by Anticoagulation Clinic. 90 tablet 1   No current facility-administered medications for this visit.     Physical Exam: BP 131/76 (BP Location: Left Arm, Patient Position: Sitting)   Pulse 61   Resp 20   Ht '5\' 5"'$  (1.651 m)   Wt 175 lb (79.4 kg)   SpO2 97% Comment: RA  BMI 29.12 kg/m  She looks well. Cardiac exam shows a regular rate and rhythm with a crisp mechanical valve click.  There is a 2/6 systolic flow murmur along the right  sternal border.  There is no diastolic murmur. Lungs are clear. There is no peripheral edema.  Diagnostic Tests:  Narrative & Impression  CLINICAL DATA:  66 year old female with a history of aortic aneurysm   EXAM: MRA CHEST WITH OR WITHOUT CONTRAST   TECHNIQUE: Angiographic images of the chest were obtained using MRA technique without and with intravenous contrast.   CONTRAST:  23m MULTIHANCE GADOBENATE DIMEGLUMINE 529 MG/ML IV SOLN   COMPARISON:  08/18/2020, 09/26/2019, 09/26/2018   FINDINGS: VASCULAR   Aorta: Redemonstration of surgical changes of median sternotomy and aortic valve repair.   Estimated diameter of the ascending aorta 46 mm, unchanged.   Flow signal maintained in the branch vessels.   No evidence of dissection. No aortic wall thickening or periaortic fluid.   Heart: Heart size within normal limits.  No pericardial fluid.   Pulmonary Arteries: Main pulmonary artery diameter within normal limits, 25 mm. Flow signal maintained.   Other: None   NON-VASCULAR   Mediastinum: Surgical changes median sternotomy with artifact/susceptibility somewhat limiting the evaluation of the anterior mediastinum. No adenopathy. Unremarkable thoracic esophagus.   Lungs/pleura:   No pleural fluid.  Signal within the lungs is unremarkable.   Chest wall/musculoskeletal: Unremarkable signal   Upper abdomen: Incidental imaging of a left liver (segment 2) T2 bright  cystic structure most likely a benign biliary cyst though incompletely characterized. This was present on CT dated 08/18/2020.   IMPRESSION: Ascending aorta appears relatively unchanged in size and configuration, estimated 4.6 cm on the current MR.   Signed,   Dulcy Fanny. Dellia Nims, RPVI   [facility]   [facility]     Electronically Signed   By: Corrie Mckusick D.O.   On: 08/19/2021 12:40      Impression:  She has a stable 4.6 cm fusiform  ascending aortic aneurysm with a history of bicuspid aortic valve disease.  This is below the surgical threshold of 5 to 5.5 cm although it is slightly larger than 2 times the size of her descending aorta at the same level that I measured at 2.2 cm.  I reviewed the CT images with her and answered all her questions.  I have recommended continued close follow-up since she has had prior aortic valve replacement.  I stressed the importance of continued good blood pressure control in preventing further enlargement and acute aortic dissection.    Plan:  She will return to see me in 1 year with an MRA of the chest.  I spent 20 minutes performing this established patient evaluation and > 50% of this time was spent face to face counseling and coordinating the care of this patient's aortic aneurysm.    Gaye Pollack, MD [facility] [phone]

## 2021-10-21 ENCOUNTER — Ambulatory Visit: Payer: Federal, State, Local not specified - PPO

## 2021-10-21 DIAGNOSIS — Q231 Congenital insufficiency of aortic valve: Secondary | ICD-10-CM

## 2021-10-21 DIAGNOSIS — Z5181 Encounter for therapeutic drug level monitoring: Secondary | ICD-10-CM

## 2021-10-21 LAB — POCT INR: INR: 3.1 — AB (ref 2.0–3.0)

## 2021-10-21 NOTE — Patient Instructions (Signed)
Description   Continue taking Warfarin 7.'5mg'$  daily except '10mg'$  on Tuesdays.  Recheck INR in 6 weeks.  Coumadin Clinic [phone]

## 2021-11-07 IMAGING — MR MR MRA CHEST W/ OR W/O CM
15 series · 16 of 16 positions shown · IV contrast (Multihance 16 ml)
Comparison: Chest CTA 09/26/2019

CLINICAL DATA: 64-year-old with an ascending thoracic aortic
aneurysm.

EXAM:
MRA CHEST WITH OR WITHOUT CONTRAST
TECHNIQUE: Angiographic images of the chest were obtained using MRA technique
without and with intravenous contrast.
CONTRAST:  16mL MULTIHANCE GADOBENATE DIMEGLUMINE 529 MG/ML IV SOLN

[Series 2: bSSFP · axial · 6.0mm · 1.48mm/px · 1 of 48 slices shown (1 of 2)]
[im 1/48]
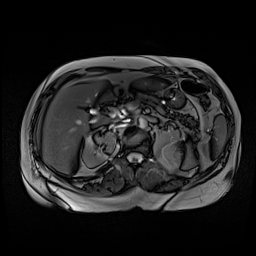

[Series 3: axial haste · axial · 5.0mm · 0.74mm/px · 1 of 48 slices shown]
[im 1/48]
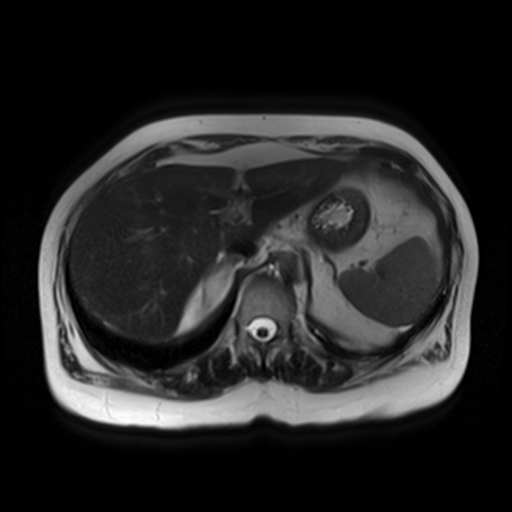

[Series 4: T1 fat-sat · axial · 5.0mm · 1.48mm/px · 1 of 45 slices shown]
[im 1/45]
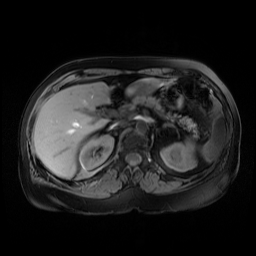

[Series 5: bSSFP · sagittal · 4.0mm · 1.56mm/px · 1 of 35 slices shown (2 of 2)]
[im 1/35]
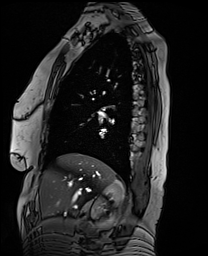

[Series 6: T1 dynamic · axial · non-contrast · 2.5mm · 1.56mm/px · 1 of 112 slices shown]
[im 1/112]
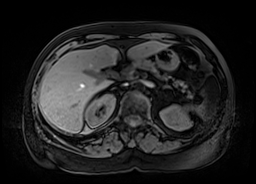

[Series 9: cor flash candy · sagittal · non-contrast · 1.5mm · 1.04mm/px · 1 of 88 slices shown (1 of 3)]
[im 1/88]
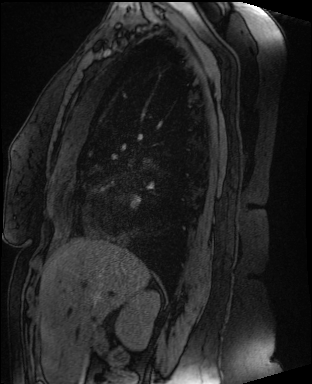

[Series 10: care_bolus_cor · coronal · 50.0mm · 1.56mm/px · 1 of 28 slices shown]
[im 1/28]
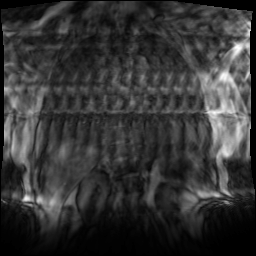

[Series 11: cor flash candy · sagittal · 1.5mm · 1.04mm/px · 1 of 88 slices shown (2 of 3)]
[im 1/88]
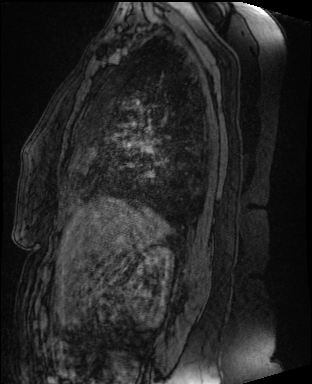

[Series 11: sag flash candy · sagittal · 1.5mm · 1.04mm/px · 1 of 88 slices shown (1 of 2)]
[im 1/88]
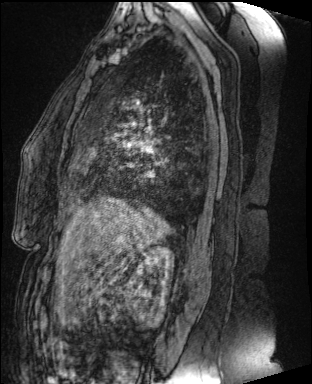

[Series 12: sag flash candy · sagittal · 1.5mm · 1.04mm/px · 1 of 88 slices shown (2 of 2)]
[im 1/88]
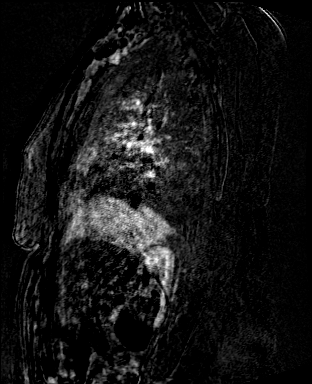

[Series 12: cor flash candy · sagittal · 1.5mm · 1.04mm/px · 1 of 88 slices shown (3 of 3)]
[im 1/88]
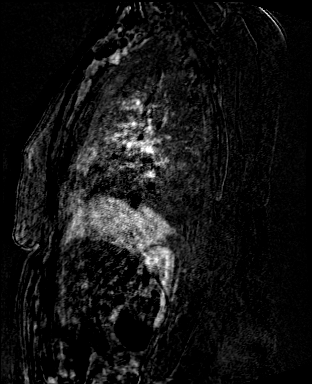

[Series 14: T1 dynamic post-contrast · axial · 2.5mm · 1.56mm/px · 1 of 112 slices shown (1 of 2)]
[im 1/112]
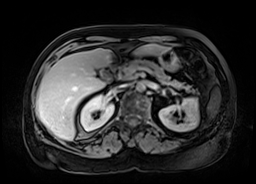

[Series 15: T1 dynamic post-contrast · axial · 2.5mm · 1.56mm/px · z∈[-171,+107]mm · 2 of 112 slices shown (2 of 2)]
[im 1/112]
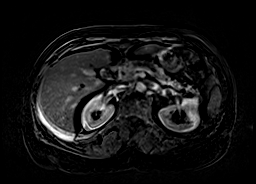
[im 112/112]
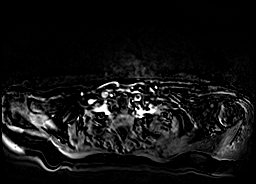

[Series 16: T1 fat-sat post-contrast · axial · 5.0mm · 1.48mm/px · 1 of 45 slices shown]
[im 1/45]
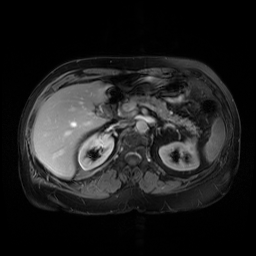

[Series 100: MRA · sagittal · 1.04mm/px · 1 of 16 slices shown]
[im 1/16]
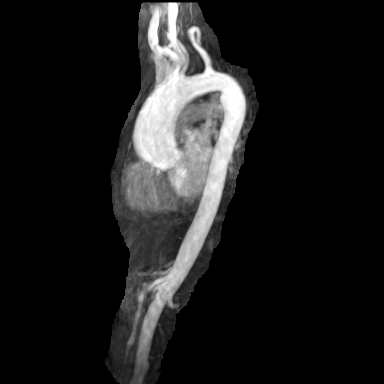

[16 of 16 positions shown; findings below may reference images not displayed]

FINDINGS: VASCULAR

Aorta: Aortic valve replacement with median sternotomy wires.
Fusiform aneurysm of the ascending thoracic aorta measures 4.6 cm
and stable. No evidence for an aortic dissection. Distal ascending
thoracic aorta measures 3.8 cm and stable. Typical three-vessel arch
anatomy. The great vessels are patent. Proximal descending thoracic
aorta measures 2.5 cm and stable. Normal caliber of the proximal
abdominal aorta.

Heart: Heart size is normal.  No significant pericardial fluid.

Pulmonary Arteries:  Main and lobar pulmonary arteries are patent.

Other: Celiac trunk and SMA are patent. Bilateral renal arteries are
patent.

NON-VASCULAR

Mediastinal structures: No significant chest lymphadenopathy.

Lungs and pleura: No large pleural effusions. No gross lung
abnormalities.

Musculoskeletal: No acute abnormality.

Upper abdomen: 1.3 cm structure in the lateral left hepatic lobe
probably represents a cyst. No acute abnormality in visualized upper
abdominal structures.
IMPRESSION: Stable fusiform aneurysm of the ascending thoracic aorta measuring
up to 4.6 cm.

## 2021-12-02 ENCOUNTER — Ambulatory Visit: Payer: Federal, State, Local not specified - PPO

## 2021-12-02 DIAGNOSIS — Z5181 Encounter for therapeutic drug level monitoring: Secondary | ICD-10-CM | POA: Diagnosis not present

## 2021-12-02 DIAGNOSIS — Q231 Congenital insufficiency of aortic valve: Secondary | ICD-10-CM | POA: Diagnosis not present

## 2021-12-02 LAB — POCT INR: INR: 3.3 — AB (ref 2.0–3.0)

## 2021-12-02 NOTE — Patient Instructions (Signed)
Description   Eat a serving of  greens today and then START taking Warfarin 7.'5mg'$  daily. Recheck INR in 3 weeks.  Coumadin Clinic [phone]

## 2021-12-23 ENCOUNTER — Ambulatory Visit: Payer: Federal, State, Local not specified - PPO | Admitting: *Deleted

## 2021-12-23 DIAGNOSIS — Z5181 Encounter for therapeutic drug level monitoring: Secondary | ICD-10-CM | POA: Diagnosis not present

## 2021-12-23 DIAGNOSIS — Q231 Congenital insufficiency of aortic valve: Secondary | ICD-10-CM

## 2021-12-23 LAB — POCT INR: INR: 2.8 (ref 2.0–3.0)

## 2021-12-23 NOTE — Patient Instructions (Signed)
Description   Continue taking Warfarin 7.'5mg'$  daily. Recheck INR in 3 weeks.  Coumadin Clinic [phone] or [phone]

## 2022-01-19 ENCOUNTER — Ambulatory Visit: Payer: Federal, State, Local not specified - PPO | Attending: Cardiovascular Disease

## 2022-01-19 DIAGNOSIS — Z5181 Encounter for therapeutic drug level monitoring: Secondary | ICD-10-CM | POA: Diagnosis not present

## 2022-01-19 DIAGNOSIS — Q231 Congenital insufficiency of aortic valve: Secondary | ICD-10-CM | POA: Diagnosis not present

## 2022-01-19 LAB — POCT INR: INR: 2.8 (ref 2.0–3.0)

## 2022-01-19 NOTE — Patient Instructions (Signed)
Continue taking Warfarin 7.'5mg'$  daily. Recheck INR in 6 weeks.  Coumadin Clinic [phone] or [phone]

## 2022-02-09 DIAGNOSIS — Z23 Encounter for immunization: Secondary | ICD-10-CM | POA: Diagnosis not present

## 2022-02-14 ENCOUNTER — Other Ambulatory Visit: Payer: Self-pay | Admitting: Cardiovascular Disease

## 2022-02-14 DIAGNOSIS — Z952 Presence of prosthetic heart valve: Secondary | ICD-10-CM

## 2022-02-17 ENCOUNTER — Ambulatory Visit
Admission: RE | Admit: 2022-02-17 | Discharge: 2022-02-17 | Disposition: A | Discharge status: Home / Self Care | Payer: Federal, State, Local not specified - PPO | Source: Ambulatory Visit | Attending: Nurse Practitioner | Admitting: Nurse Practitioner

## 2022-02-17 DIAGNOSIS — M8580 Other specified disorders of bone density and structure, unspecified site: Secondary | ICD-10-CM

## 2022-02-17 DIAGNOSIS — M8589 Other specified disorders of bone density and structure, multiple sites: Secondary | ICD-10-CM | POA: Diagnosis not present

## 2022-02-17 DIAGNOSIS — Z1231 Encounter for screening mammogram for malignant neoplasm of breast: Secondary | ICD-10-CM | POA: Diagnosis not present

## 2022-02-17 DIAGNOSIS — Z780 Asymptomatic menopausal state: Secondary | ICD-10-CM | POA: Diagnosis not present

## 2022-03-02 ENCOUNTER — Ambulatory Visit: Payer: Federal, State, Local not specified - PPO | Attending: Cardiovascular Disease

## 2022-03-02 DIAGNOSIS — Z5181 Encounter for therapeutic drug level monitoring: Secondary | ICD-10-CM

## 2022-03-02 LAB — POCT INR: INR: 3.2 — AB (ref 2.0–3.0)

## 2022-03-02 NOTE — Patient Instructions (Signed)
Continue taking Warfarin 7.'5mg'$  daily. Recheck INR in 6 weeks.  Coumadin Clinic [phone] or [phone]; Eat greens tonight

## 2022-04-12 DIAGNOSIS — M2392 Unspecified internal derangement of left knee: Secondary | ICD-10-CM | POA: Diagnosis not present

## 2022-04-12 DIAGNOSIS — M25562 Pain in left knee: Secondary | ICD-10-CM | POA: Diagnosis not present

## 2022-04-13 ENCOUNTER — Ambulatory Visit: Payer: Federal, State, Local not specified - PPO | Attending: Cardiology | Admitting: *Deleted

## 2022-04-13 DIAGNOSIS — Z5181 Encounter for therapeutic drug level monitoring: Secondary | ICD-10-CM | POA: Diagnosis not present

## 2022-04-13 DIAGNOSIS — Q231 Congenital insufficiency of aortic valve: Secondary | ICD-10-CM

## 2022-04-13 DIAGNOSIS — Q2381 Bicuspid aortic valve: Secondary | ICD-10-CM

## 2022-04-13 LAB — POCT INR: INR: 3.8 — AB (ref 2.0–3.0)

## 2022-04-13 NOTE — Patient Instructions (Addendum)
Description   Do not take any warfarin tonight then continue taking Warfarin 7.'5mg'$  daily. Recheck INR in 3 weeks. Coumadin Clinic [phone] or [phone]; Please resume normal green intake.

## 2022-04-30 DIAGNOSIS — M25562 Pain in left knee: Secondary | ICD-10-CM | POA: Diagnosis not present

## 2022-05-05 DIAGNOSIS — S83242A Other tear of medial meniscus, current injury, left knee, initial encounter: Secondary | ICD-10-CM | POA: Diagnosis not present

## 2022-05-06 ENCOUNTER — Ambulatory Visit: Payer: Federal, State, Local not specified - PPO | Attending: Cardiovascular Disease | Admitting: *Deleted

## 2022-05-06 DIAGNOSIS — Z1331 Encounter for screening for depression: Secondary | ICD-10-CM | POA: Diagnosis not present

## 2022-05-06 DIAGNOSIS — Q231 Congenital insufficiency of aortic valve: Secondary | ICD-10-CM

## 2022-05-06 DIAGNOSIS — E785 Hyperlipidemia, unspecified: Secondary | ICD-10-CM | POA: Diagnosis not present

## 2022-05-06 DIAGNOSIS — R7303 Prediabetes: Secondary | ICD-10-CM | POA: Diagnosis not present

## 2022-05-06 DIAGNOSIS — Z5181 Encounter for therapeutic drug level monitoring: Secondary | ICD-10-CM

## 2022-05-06 DIAGNOSIS — E669 Obesity, unspecified: Secondary | ICD-10-CM | POA: Diagnosis not present

## 2022-05-06 DIAGNOSIS — G4733 Obstructive sleep apnea (adult) (pediatric): Secondary | ICD-10-CM | POA: Diagnosis not present

## 2022-05-06 LAB — POCT INR: INR: 2.8 (ref 2.0–3.0)

## 2022-05-06 NOTE — Patient Instructions (Signed)
Description   Continue taking Warfarin 7.'5mg'$  daily. Recheck INR in 4 weeks. Coumadin Clinic [phone] or [phone]; Please resume normal green intake.

## 2022-05-13 DIAGNOSIS — M8580 Other specified disorders of bone density and structure, unspecified site: Secondary | ICD-10-CM | POA: Diagnosis not present

## 2022-05-13 DIAGNOSIS — R739 Hyperglycemia, unspecified: Secondary | ICD-10-CM | POA: Diagnosis not present

## 2022-05-13 DIAGNOSIS — E559 Vitamin D deficiency, unspecified: Secondary | ICD-10-CM | POA: Diagnosis not present

## 2022-05-13 DIAGNOSIS — E785 Hyperlipidemia, unspecified: Secondary | ICD-10-CM | POA: Diagnosis not present

## 2022-05-30 ENCOUNTER — Other Ambulatory Visit: Payer: Self-pay | Admitting: Cardiovascular Disease

## 2022-06-03 ENCOUNTER — Ambulatory Visit: Payer: Federal, State, Local not specified - PPO

## 2022-06-03 DIAGNOSIS — R7303 Prediabetes: Secondary | ICD-10-CM | POA: Diagnosis not present

## 2022-06-03 DIAGNOSIS — E785 Hyperlipidemia, unspecified: Secondary | ICD-10-CM | POA: Diagnosis not present

## 2022-06-03 DIAGNOSIS — G4733 Obstructive sleep apnea (adult) (pediatric): Secondary | ICD-10-CM | POA: Diagnosis not present

## 2022-06-09 ENCOUNTER — Ambulatory Visit: Payer: Federal, State, Local not specified - PPO | Attending: Cardiology | Admitting: *Deleted

## 2022-06-09 DIAGNOSIS — Z5181 Encounter for therapeutic drug level monitoring: Secondary | ICD-10-CM

## 2022-06-09 DIAGNOSIS — Q231 Congenital insufficiency of aortic valve: Secondary | ICD-10-CM

## 2022-06-09 LAB — POCT INR: INR: 2.3 (ref 2.0–3.0)

## 2022-06-09 NOTE — Patient Instructions (Addendum)
Description   Continue taking Warfarin 7.'5mg'$  daily. Recheck INR in 5 weeks. Coumadin Clinic [phone] or [phone];Continue normal green intake.

## 2022-06-10 ENCOUNTER — Ambulatory Visit (HOSPITAL_COMMUNITY): Payer: Federal, State, Local not specified - PPO | Attending: Cardiology

## 2022-06-10 DIAGNOSIS — I359 Nonrheumatic aortic valve disorder, unspecified: Secondary | ICD-10-CM | POA: Diagnosis not present

## 2022-06-10 DIAGNOSIS — I7121 Aneurysm of the ascending aorta, without rupture: Secondary | ICD-10-CM | POA: Diagnosis not present

## 2022-06-10 LAB — ECHOCARDIOGRAM COMPLETE
AR max vel: 0.9 cm2
AV Area VTI: 0.89 cm2
AV Area mean vel: 0.87 cm2
AV Mean grad: 28 mmHg
AV Peak grad: 50.2 mmHg
Ao pk vel: 3.54 m/s
Area-P 1/2: 2.08 cm2
S' Lateral: 2.7 cm

## 2022-06-18 DIAGNOSIS — M25561 Pain in right knee: Secondary | ICD-10-CM | POA: Diagnosis not present

## 2022-06-18 DIAGNOSIS — M23203 Derangement of unspecified medial meniscus due to old tear or injury, right knee: Secondary | ICD-10-CM | POA: Diagnosis not present

## 2022-06-21 DIAGNOSIS — E785 Hyperlipidemia, unspecified: Secondary | ICD-10-CM | POA: Diagnosis not present

## 2022-06-21 DIAGNOSIS — I10 Essential (primary) hypertension: Secondary | ICD-10-CM | POA: Diagnosis not present

## 2022-06-21 DIAGNOSIS — Z23 Encounter for immunization: Secondary | ICD-10-CM | POA: Diagnosis not present

## 2022-06-21 DIAGNOSIS — Z0000 Encounter for general adult medical examination without abnormal findings: Secondary | ICD-10-CM | POA: Diagnosis not present

## 2022-06-21 DIAGNOSIS — Z79899 Other long term (current) drug therapy: Secondary | ICD-10-CM | POA: Diagnosis not present

## 2022-06-21 DIAGNOSIS — R7303 Prediabetes: Secondary | ICD-10-CM | POA: Diagnosis not present

## 2022-06-27 ENCOUNTER — Other Ambulatory Visit: Payer: Self-pay | Admitting: Cardiovascular Disease

## 2022-06-27 DIAGNOSIS — E782 Mixed hyperlipidemia: Secondary | ICD-10-CM

## 2022-06-27 DIAGNOSIS — I359 Nonrheumatic aortic valve disorder, unspecified: Secondary | ICD-10-CM

## 2022-06-27 DIAGNOSIS — I10 Essential (primary) hypertension: Secondary | ICD-10-CM

## 2022-06-27 DIAGNOSIS — I7121 Aneurysm of the ascending aorta, without rupture: Secondary | ICD-10-CM

## 2022-06-29 ENCOUNTER — Other Ambulatory Visit: Payer: Self-pay

## 2022-06-29 ENCOUNTER — Other Ambulatory Visit (HOSPITAL_COMMUNITY): Payer: Self-pay

## 2022-06-29 MED ORDER — SEMAGLUTIDE-WEIGHT MANAGEMENT 0.25 MG/0.5ML ~~LOC~~ SOAJ
0.2500 mg | SUBCUTANEOUS | 0 refills | Status: AC
  Filled 2022-06-29: qty 2, 28d supply, fill #0

## 2022-06-29 MED ORDER — SEMAGLUTIDE-WEIGHT MANAGEMENT 0.5 MG/0.5ML ~~LOC~~ SOAJ
0.5000 mg | SUBCUTANEOUS | 0 refills | Status: AC
  Filled 2022-06-29: qty 2, 28d supply, fill #0

## 2022-06-29 MED ORDER — WEGOVY 0.5 MG/0.5ML ~~LOC~~ SOAJ
0.5000 mg | SUBCUTANEOUS | 0 refills | Status: AC
  Filled 2022-06-29: qty 2, 28d supply, fill #0

## 2022-06-29 MED ORDER — WEGOVY 0.25 MG/0.5ML ~~LOC~~ SOAJ
0.2500 mg | SUBCUTANEOUS | 0 refills | Status: AC
  Filled 2022-06-29: qty 2, 28d supply, fill #0

## 2022-06-30 ENCOUNTER — Other Ambulatory Visit (HOSPITAL_COMMUNITY): Payer: Self-pay

## 2022-06-30 DIAGNOSIS — R7303 Prediabetes: Secondary | ICD-10-CM | POA: Diagnosis not present

## 2022-06-30 DIAGNOSIS — G4733 Obstructive sleep apnea (adult) (pediatric): Secondary | ICD-10-CM | POA: Diagnosis not present

## 2022-06-30 DIAGNOSIS — E785 Hyperlipidemia, unspecified: Secondary | ICD-10-CM | POA: Diagnosis not present

## 2022-06-30 DIAGNOSIS — E669 Obesity, unspecified: Secondary | ICD-10-CM | POA: Diagnosis not present

## 2022-06-30 MED ORDER — WEGOVY 0.5 MG/0.5ML ~~LOC~~ SOAJ
0.5000 mg | SUBCUTANEOUS | 0 refills | Status: AC
  Filled 2022-06-30: qty 2, 28d supply, fill #0

## 2022-07-02 DIAGNOSIS — M25561 Pain in right knee: Secondary | ICD-10-CM | POA: Diagnosis not present

## 2022-07-06 ENCOUNTER — Other Ambulatory Visit: Payer: Self-pay

## 2022-07-07 ENCOUNTER — Ambulatory Visit: Payer: Federal, State, Local not specified - PPO | Attending: Cardiovascular Disease | Admitting: Cardiovascular Disease

## 2022-07-07 ENCOUNTER — Encounter: Payer: Self-pay | Admitting: Cardiovascular Disease

## 2022-07-07 VITALS — BP 120/80 | HR 72 | Ht 64.5 in | Wt 178.8 lb

## 2022-07-07 DIAGNOSIS — I359 Nonrheumatic aortic valve disorder, unspecified: Secondary | ICD-10-CM | POA: Diagnosis not present

## 2022-07-07 DIAGNOSIS — I7121 Aneurysm of the ascending aorta, without rupture: Secondary | ICD-10-CM

## 2022-07-07 DIAGNOSIS — I10 Essential (primary) hypertension: Secondary | ICD-10-CM | POA: Diagnosis not present

## 2022-07-07 DIAGNOSIS — E782 Mixed hyperlipidemia: Secondary | ICD-10-CM | POA: Diagnosis not present

## 2022-07-07 MED ORDER — METOPROLOL TARTRATE 100 MG PO TABS: ORAL_TABLET | ORAL | 0 refills | Status: DC

## 2022-07-07 NOTE — Patient Instructions (Addendum)
Medication Instructions:  Your physician recommends that you continue on your current medications as directed. Please refer to the Current Medication list given to you today.  *If you need a refill on your cardiac medications before your next appointment, please call your pharmacy*   Lab Work: BMET today  If you have labs (blood work) drawn today and your tests are completely normal, you will receive your results only by: [place] (if you have MyChart) OR A paper copy in the mail If you have any lab test that is abnormal or we need to change your treatment, we will call you to review the results.   Testing/Procedures: Coronary CTA Your physician has requested that you have cardiac CT. Cardiac computed tomography (CT) is a painless test that uses an x-ray machine to take clear, detailed pictures of your heart. For further information please visit [email or web address]. Please follow instruction sheet as given.     Follow-Up: At [facility], you and your health needs are our priority.  As part of our continuing mission to provide you with exceptional heart care, we have created designated Provider Care Teams.  These Care Teams include your primary Cardiologist (physician) and Advanced Practice Providers (APPs -  Physician Assistants and Nurse Practitioners) who all work together to provide you with the care you need, when you need it.  We recommend signing up for the patient portal called "MyChart".  Sign up information is provided on this After Visit Summary.  MyChart is used to connect with patients for Virtual Visits (Telemedicine).  Patients are able to view lab/test results, encounter notes, upcoming appointments, etc.  Non-urgent messages can be sent to your provider as well.   To learn more about what you can do with MyChart, go to [email or web address].    Your next appointment:   1 year(s)  Provider:   Sherren Mocha, MD    Other Instructions   Your cardiac  CT will be scheduled at:   [facility] [address] [place],  [address] [phone]  Please arrive at the [facility] and Children's Entrance (Entrance C2) of [facility] 30 minutes prior to test start time. You can use the FREE valet parking offered at entrance C (encouraged to control the heart rate for the test)  Proceed to the [facility] Radiology Department (first floor) to check-in and test prep.  All radiology patients and guests should use entrance C2 at [facility], accessed from [facility], even though the hospital's physical address listed is [address]     Please follow these instructions carefully (unless otherwise directed):   On the Night Before the Test: Be sure to Drink plenty of water. Do not consume any caffeinated/decaffeinated beverages or chocolate 12 hours prior to your test. Do not take any antihistamines 12 hours prior to your test.  On the Day of the Test: Drink plenty of water until 1 hour prior to the test. Do not eat any food 1 hour prior to test. You may take your regular medications prior to the test.  Take metoprolol (Lopressor) two hours prior to test. FEMALES- please wear underwire-free bra if available, avoid dresses & tight clothing       After the Test: Drink plenty of water. After receiving IV contrast, you may experience a mild flushed feeling. This is normal. On occasion, you may experience a mild rash up to 24 hours after the test. This is not dangerous. If this occurs, you can  take Benadryl 25 mg and increase your fluid intake. If you experience trouble breathing, this can be serious. If it is severe call 911 IMMEDIATELY. If it is mild, please call our office. If you take any of these medications: Glipizide/Metformin, Avandament, Glucavance, please do not take 48 hours after completing test unless otherwise instructed.  We will call to schedule your test 2-4 weeks out  understanding that some insurance companies will need an authorization prior to the service being performed.   For non-scheduling related questions, please contact the cardiac imaging nurse navigator should you have any questions/concerns: Marchia Bond, Cardiac Imaging Nurse Navigator Gordy Clement, Cardiac Imaging Nurse Navigator [facility] Direct Office Dial: [phone]   For scheduling needs, including cancellations and rescheduling, please call [place], [phone].

## 2022-07-07 NOTE — Progress Notes (Signed)
Cardiology Office Note:    Date:  07/07/2022   ID:  Monica Key, DOB Sep 08, 1955, MRN [id number]  PCP:  Monica Key, [place] Prov[facility] Cardiologist:  Monica Mocha, MD     Referring MD: Monica Huddle, MD   Chief Complaint  Patient presents with   Shortness of Breath    History of Present Illness:    Monica Key is a 67 y.o. female with a hx of mechanical aortic valve replacement in 1999. She is followed for an ascending aortic aneurysm with maximal diameter approximately 4.6 cm.  She has undergone annual MRA studies and sees Dr. Cyndia Key for surveillance of her aneurysm.  She recently had an echocardiogram that showed a modest increase in her mean transaortic valve gradient from 20 mmHg in 2020 up to 28 mmHg on her recent study.  He also noted an increase in the size of her maximal dimension of her ascending aorta.  From a symptomatic perspective, the patient has noticed some mild shortness of breath with exercise at the gym.  She has recently gone back to the gym and just started back up on a walking program.  She has had no chest pain, chest pressure, lightheadedness, orthopnea, PND, or heart palpitations.  She otherwise has been doing well with no symptoms associated with any of her normal activities.  Past Medical History:  Diagnosis Date   Diabetes mellitus without complication ([place])    Hypertension    Pinched nerve in neck     Past Surgical History:  Procedure Laterality Date   CARDIAC SURGERY      Current Medications: Current Meds  Medication Sig   aspirin EC 81 MG tablet Take by mouth every other day.   carvedilol (COREG) 6.25 MG tablet Take 1 tablet (6.25 mg total) by mouth 2 (two) times daily with a meal.   Cholecalciferol (VITAMIN D) 1000 UNITS capsule Take 1,000 Units by mouth daily.   co-enzyme Q-10 30 MG capsule Take 30 mg by mouth daily.   cyanocobalamin 100 MCG tablet 1 tablet Orally Once a day for 30 day(s)   hyoscyamine (LEVSIN SL)  0.125 MG SL tablet Place 1 tablet (0.125 mg total) under the tongue every 6 (six) hours as needed for cramping.   melatonin 5 MG TABS 5 mg. Taking at bedtime   metFORMIN (GLUCOPHAGE) 500 MG tablet Take 500 mg by mouth 2 (two) times daily with a meal.   Polyethyl Glycol-Propyl Glycol (SYSTANE OP) Apply 1 drop to eye every morning.   risedronate (ACTONEL) 35 MG tablet 1 tablet at least 30 minutes before the first food or drink, other than water, of the day by mouth once a week for 30 days   rosuvastatin (CRESTOR) 10 MG tablet TAKE ONE TABLET BY MOUTH DAILY   Semaglutide-Weight Management (WEGOVY) 0.25 MG/0.5ML SOAJ Inject 0.25 mg into the skin once a week.   Semaglutide-Weight Management (WEGOVY) 0.5 MG/0.5ML SOAJ Inject 0.5 mg into the skin once a week.   Semaglutide-Weight Management (WEGOVY) 0.5 MG/0.5ML SOAJ Inject 0.5 mg into the skin once a week.   Semaglutide-Weight Management 0.25 MG/0.5ML SOAJ Inject 0.25 mg into the skin once a week.   Semaglutide-Weight Management 0.5 MG/0.5ML SOAJ Inject 0.5 mg into the skin once a week.   telmisartan (MICARDIS) 20 MG tablet Take 1 tablet (20 mg total) by mouth daily.   warfarin (COUMADIN) 10 MG tablet TAKE AS DIRECTED BY ANTICOAGULATION CLINIC   warfarin (COUMADIN) 7.5 MG tablet Take 7.'5mg'$   daily by mouth except '10mg'$  on Tuesdays or as directed by Anticoagulation Clinic.     Allergies:   Hydralazine hcl, Penicillins, Sulfonamide derivatives, and Vancomycin   Social History   Socioeconomic History   Marital status: Married    Spouse name: Not on file   Number of children: 1   Years of education: Not on file   Highest education level: Not on file  Occupational History   Not on file  Tobacco Use   Smoking status: Never   Smokeless tobacco: Never  Vaping Use   Vaping Use: Never used  Substance and Sexual Activity   Alcohol use: No   Drug use: No   Sexual activity: Not Currently  Other Topics Concern   Not on file  Social History Narrative    Not on file   Social Determinants of Health   Financial Resource Strain: Not on file  Food Insecurity: Not on file  Transportation Needs: Not on file  Physical Activity: Not on file  Stress: Not on file  Social Connections: Not on file     Family History: The patient's family history includes Alzheimer's disease in her mother; Angina in her mother; Transient ischemic attack in her father. There is no history of Colon cancer, Stomach cancer, Esophageal cancer, Pancreatic cancer, or Liver cancer.  ROS:   Please see the history of present illness.    All other systems reviewed and are negative.  EKGs/Labs/Other Studies Reviewed:    The following studies were reviewed today: Echo 06/10/2022:  1. Left ventricular ejection fraction, by estimation, is 60 to 65%. The  left ventricle has normal function. The left ventricle has no regional  wall motion abnormalities. There is mild left ventricular hypertrophy.  Left ventricular diastolic parameters  are consistent with Grade I diastolic dysfunction (impaired relaxation).   2. Right ventricular systolic function is normal. The right ventricular  size is normal.   3. Left atrial size was mildly dilated.   4. The mitral valve is normal in structure. Trivial mitral valve  regurgitation.   5. The aortic valve has been repaired/replaced. Aortic valve  regurgitation is trivial. There is a mechanical valve present in the  aortic position. Aortic valve mean gradient measures 28.0 mmHg. EOA  1.0cm2, DI 0.32. Prior mean gradient in 2020 was 20.0  with EOA 1.1cm2.   6. Aortic dilatation noted. There is moderate dilatation of the ascending  aorta, measuring 48 mm.   7. The inferior vena cava is normal in size with greater than 50%  respiratory variability, suggesting right atrial pressure of 3 mmHg.   Comparison(s): Prior TTE in 2020 with Aortic valve EOA 1.1cm2, mean  gradient 73mHg. Ascending aorta previously measured 447m Now measuring   4860m  MRA 08/19/2021: IMPRESSION: Ascending aorta appears relatively unchanged in size and configuration, estimated 4.6 cm on the current MR.  EKG:  EKG is ordered today.  The ekg ordered today demonstrates normal sinus rhythm with sinus arrhythmia, 72 bpm, nonspecific ST and T wave abnormality  Recent Labs: 09/07/2021: ALT 14  Recent Lipid Panel    Component Value Date/Time   CHOL 142 09/07/2021 0719   CHOL 133 12/12/2012 0743   TRIG 114 09/07/2021 0719   TRIG 96 12/12/2012 0743   HDL 52 09/07/2021 0719   HDL 45 12/12/2012 0743   CHOLHDL 2.7 09/07/2021 0719   CHOLHDL 3.7 01/16/2016 0835   VLDL 21 01/16/2016 0835   LDLCALC 69 09/07/2021 0719   LDLCALC 69 12/12/2012 0743  LDLDIRECT 151.7 06/21/2006 0902     Risk Assessment/Calculations:                Physical Exam:    VS:  BP 120/80   Pulse 72   Ht 5' 4.5" (1.638 m)   Wt 178 lb 12.8 oz (81.1 kg)   SpO2 97%   BMI 30.22 kg/m     Wt Readings from Last 3 Encounters:  07/07/22 178 lb 12.8 oz (81.1 kg)  10/07/21 175 lb (79.4 kg)  06/10/21 175 lb 12.8 oz (79.7 kg)     GEN:  Well nourished, well developed in no acute distress HEENT: Normal NECK: No JVD; No carotid bruits LYMPHATICS: No lymphadenopathy CARDIAC: RRR, 2/6 systolic ejection murmur at the right upper sternal border, normal mechanical A2, no diastolic murmur RESPIRATORY:  Clear to auscultation without rales, wheezing or rhonchi  ABDOMEN: Soft, non-tender, non-distended MUSCULOSKELETAL:  No edema; No deformity  SKIN: Warm and dry NEUROLOGIC:  Alert and oriented x 3 PSYCHIATRIC:  Normal affect   ASSESSMENT:    1. Aortic valve disorder   2. Aneurysm of ascending aorta without rupture ([place])   3. Essential hypertension   4. Mixed hyperlipidemia    PLAN:    In order of problems listed above:  Patient's echo is reviewed.  She does have a modest increase in her transaortic gradients and has noticed mild exertional dyspnea.  In addition to a  surveillance echocardiogram in 1 year, I think it would be appropriate to do a gated cardiac CTA to evaluate her mechanical leaflet motion.  Otherwise she will continue on her current medical program.  She is tolerating aspirin and warfarin without bleeding problems. Slight increase in the size of her ascending aorta by echo.  Will get a better assessment with cross-sectional imaging.  She has been followed with serial MRIs.  Will check a CTA of the heart as above and this should give [place] a good evaluation of her ascending aorta as well. Blood pressure is controlled on telmisartan and carvedilol.  Continue current medicines. Treated with rosuvastatin.  Last lipids with an HDL of 52 and an LDL of 69.  Continue current management.      Medication Adjustments/Labs and Tests Ordered: Current medicines are reviewed at length with the patient today.  Concerns regarding medicines are outlined above.  No orders of the defined types were placed in this encounter.  No orders of the defined types were placed in this encounter.   Patient Instructions  Medication Instructions:  Your physician recommends that you continue on your current medications as directed. Please refer to the Current Medication list given to you today.  *If you need a refill on your cardiac medications before your next appointment, please call your pharmacy*   Lab Work: None  If you have labs (blood work) drawn today and your tests are completely normal, you will receive your results only by: [place] (if you have MyChart) OR A paper copy in the mail If you have any lab test that is abnormal or we need to change your treatment, we will call you to review the results.   Testing/Procedures: Coronary CTA Your physician has requested that you have cardiac CT. Cardiac computed tomography (CT) is a painless test that uses an x-ray machine to take clear, detailed pictures of your heart. For further information please visit  [email or web address]. Please follow instruction sheet as given.     Follow-Up: At [facility], you and your health needs are  our priority.  As part of our continuing mission to provide you with exceptional heart care, we have created designated Provider Care Teams.  These Care Teams include your primary Cardiologist (physician) and Advanced Practice Providers (APPs -  Physician Assistants and Nurse Practitioners) who all work together to provide you with the care you need, when you need it.  We recommend signing up for the patient portal called "MyChart".  Sign up information is provided on this After Visit Summary.  MyChart is used to connect with patients for Virtual Visits (Telemedicine).  Patients are able to view lab/test results, encounter notes, upcoming appointments, etc.  Non-urgent messages can be sent to your provider as well.   To learn more about what you can do with MyChart, go to [email or web address].    Your next appointment:   1 year(s)  Provider:   Sherren Mocha, MD    Other Instructions   Your cardiac CT will be scheduled at:   [facility] [address] [place], [place] [address] [phone]  Please arrive at the [facility] and [facility] Entrance (Entrance C2) of [facility] 30 minutes prior to test start time. You can use the FREE valet parking offered at entrance C (encouraged to control the heart rate for the test)  Proceed to the [facility] Radiology Department (first floor) to check-in and test prep.  All radiology patients and guests should use entrance C2 at [facility], accessed from [facility], even though the hospital's physical address listed is [address].     Please follow these instructions carefully (unless otherwise directed):   On the Night Before the Test: Be sure to Drink plenty of water. Do not consume any caffeinated/decaffeinated beverages or chocolate 12 hours prior to  your test. Do not take any antihistamines 12 hours prior to your test.  On the Day of the Test: Drink plenty of water until 1 hour prior to the test. Do not eat any food 1 hour prior to test. You may take your regular medications prior to the test.  Take metoprolol (Lopressor) two hours prior to test. If you take Furosemide/Hydrochlorothiazide/Spironolactone, please HOLD on the morning of the test. FEMALES- please wear underwire-free bra if available, avoid dresses & tight clothing   *For Clinical Staff only. Please instruct patient the following:* Heart Rate Medication Recommendations for Cardiac CT  Resting HR < 50 bpm  No medication  Resting HR 50-60 bpm and BP >110/50 mmHG   Consider Metoprolol tartrate 25 mg PO 90-120 min prior to scan  Resting HR 60-65 bpm and BP >110/50 mmHG  Metoprolol tartrate 50 mg PO 90-120 minutes prior to scan   Resting HR > 65 bpm and BP >110/50 mmHG  Metoprolol tartrate 100 mg PO 90-120 minutes prior to scan  Consider Ivabradine 10-15 mg PO or a calcium channel blocker for resting HR >60 bpm and contraindication to metoprolol tartrate  Consider Ivabradine 10-15 mg PO in combination with metoprolol tartrate for HR >80 bpm         After the Test: Drink plenty of water. After receiving IV contrast, you may experience a mild flushed feeling. This is normal. On occasion, you may experience a mild rash up to 24 hours after the test. This is not dangerous. If this occurs, you can take Benadryl 25 mg and increase your fluid intake. If you experience trouble breathing, this can be serious. If it is severe call 911 IMMEDIATELY. If it is mild, please  call our office. If you take any of these medications: Glipizide/Metformin, Avandament, Glucavance, please do not take 48 hours after completing test unless otherwise instructed.  We will call to schedule your test 2-4 weeks out understanding that some insurance companies will need an authorization prior to the  service being performed.   For non-scheduling related questions, please contact the cardiac imaging nurse navigator should you have any questions/concerns: Marchia Bond, Cardiac Imaging Nurse Navigator Gordy Clement, Cardiac Imaging Nurse Navigator [facility] Direct Office Dial: [phone]   For scheduling needs, including cancellations and rescheduling, please call [place], [phone].    Signed, Monica Mocha, MD  07/07/2022 4:18 PM    [place]

## 2022-07-08 LAB — BASIC METABOLIC PANEL
BUN/Creatinine Ratio: 17 (ref 12–28)
BUN: 12 mg/dL (ref 8–27)
CO2: 21 mmol/L (ref 20–29)
Calcium: 9.5 mg/dL (ref 8.7–10.3)
Chloride: 103 mmol/L (ref 96–106)
Creatinine, Ser: 0.69 mg/dL (ref 0.57–1.00)
Glucose: 96 mg/dL (ref 70–99)
Potassium: 4.3 mmol/L (ref 3.5–5.2)
Sodium: 141 mmol/L (ref 134–144)
eGFR: 94 mL/min/{1.73_m2} (ref >59)

## 2022-07-13 ENCOUNTER — Ambulatory Visit: Payer: Federal, State, Local not specified - PPO

## 2022-07-13 ENCOUNTER — Telehealth: Payer: Self-pay | Admitting: Cardiovascular Disease

## 2022-07-13 NOTE — Telephone Encounter (Signed)
Returned call to patient who states she tested positive last night for COVID and is symptomatic. She is asking if she should cancel her CT scheduled for Friday. I advised her yes, it should be rescheduled and will send a message to Lisabeth Pick, Craig Guess, and Gordy Clement to achieve this.

## 2022-07-13 NOTE — Telephone Encounter (Signed)
Patient tested positive for covid and would like to talk with Dr. Burt Knack or nurse about it. Please call back

## 2022-07-16 ENCOUNTER — Ambulatory Visit (HOSPITAL_COMMUNITY): Payer: Federal, State, Local not specified - PPO

## 2022-07-16 ENCOUNTER — Telehealth (HOSPITAL_COMMUNITY): Payer: Self-pay | Admitting: *Deleted

## 2022-07-16 NOTE — Telephone Encounter (Signed)
Reaching out to patient to offer assistance regarding upcoming cardiac imaging study; pt verbalizes understanding of appt date/time, parking situation and where to check in, pre-test NPO status and medications ordered, and verified current allergies; name and call back number provided for further questions should they arise  Gordy Clement RN Navigator Cardiac Imaging [facility] [phone] office [phone] cell  Patient to take '100mg'$  metoprolol tartrate two hours prior to her cardiac CT scan.  She is aware to arrive at 8am.

## 2022-07-19 ENCOUNTER — Telehealth: Payer: Self-pay | Admitting: Cardiovascular Disease

## 2022-07-19 NOTE — Telephone Encounter (Signed)
Pt called in stating she cancel her CT because it was too expensive, wants to know if Dr. Burt Knack has any other suggestions for tests

## 2022-07-20 ENCOUNTER — Ambulatory Visit: Payer: Federal, State, Local not specified - PPO | Attending: Cardiology

## 2022-07-20 ENCOUNTER — Ambulatory Visit (HOSPITAL_COMMUNITY): Admission: RE | Admit: 2022-07-20 | Payer: Federal, State, Local not specified - PPO | Source: Ambulatory Visit

## 2022-07-20 DIAGNOSIS — Q231 Congenital insufficiency of aortic valve: Secondary | ICD-10-CM

## 2022-07-20 DIAGNOSIS — Z5181 Encounter for therapeutic drug level monitoring: Secondary | ICD-10-CM

## 2022-07-20 LAB — POCT INR: INR: 2.3 (ref 2.0–3.0)

## 2022-07-20 NOTE — Patient Instructions (Signed)
Description   Continue taking Warfarin 7.'5mg'$  daily. Recheck INR in 6 weeks. Coumadin Clinic [phone] or [phone];Continue normal green intake.

## 2022-07-26 DIAGNOSIS — S83411A Sprain of medial collateral ligament of right knee, initial encounter: Secondary | ICD-10-CM | POA: Diagnosis not present

## 2022-07-26 NOTE — Telephone Encounter (Signed)
Returned call to patient to review advice of Dr Burt Knack. She states she will stick to original plan and do CT as ordered. She was just upset about the cost and investigating potential other options.

## 2022-07-26 NOTE — Telephone Encounter (Signed)
There's not an alternative test that will give [place] as much information on her mechanical aortic valve. If she isn't able to have a CT, I would recommend the annual surveillance scan with Dr Cyndia Bent, and a repeat echo here in 6 months to make sure the gradients are stable.

## 2022-07-28 ENCOUNTER — Other Ambulatory Visit (HOSPITAL_COMMUNITY): Payer: Self-pay | Admitting: *Deleted

## 2022-07-28 DIAGNOSIS — I7121 Aneurysm of the ascending aorta, without rupture: Secondary | ICD-10-CM

## 2022-07-29 ENCOUNTER — Telehealth (HOSPITAL_COMMUNITY): Payer: Self-pay | Admitting: *Deleted

## 2022-07-29 DIAGNOSIS — H04123 Dry eye syndrome of bilateral lacrimal glands: Secondary | ICD-10-CM | POA: Diagnosis not present

## 2022-07-29 DIAGNOSIS — H26491 Other secondary cataract, right eye: Secondary | ICD-10-CM | POA: Diagnosis not present

## 2022-07-29 DIAGNOSIS — D23122 Other benign neoplasm of skin of left lower eyelid, including canthus: Secondary | ICD-10-CM | POA: Diagnosis not present

## 2022-07-29 DIAGNOSIS — E119 Type 2 diabetes mellitus without complications: Secondary | ICD-10-CM | POA: Diagnosis not present

## 2022-07-29 DIAGNOSIS — H524 Presbyopia: Secondary | ICD-10-CM | POA: Diagnosis not present

## 2022-07-29 NOTE — Telephone Encounter (Signed)
Reaching out to patient to offer assistance regarding upcoming cardiac imaging study; pt verbalizes understanding of appt date/time, parking situation and where to check in, pre-test NPO status and medications ordered, and verified current allergies; name and call back number provided for further questions should they arise  Gordy Clement RN Navigator Cardiac Imaging [facility] [phone] office [phone] cell  Patient to hold her AM carvedilol and take 100mg  metoprolol tartrate two hours prior to her cardiac CT scan.  She is aware to arrive at 3:30pm.

## 2022-07-30 ENCOUNTER — Ambulatory Visit (HOSPITAL_COMMUNITY)
Admission: RE | Admit: 2022-07-30 | Discharge: 2022-07-30 | Disposition: A | Discharge status: Home / Self Care | Payer: Federal, State, Local not specified - PPO | Source: Ambulatory Visit | Attending: Cardiovascular Disease | Admitting: Cardiovascular Disease

## 2022-07-30 DIAGNOSIS — I10 Essential (primary) hypertension: Secondary | ICD-10-CM | POA: Insufficient documentation

## 2022-07-30 DIAGNOSIS — E782 Mixed hyperlipidemia: Secondary | ICD-10-CM | POA: Insufficient documentation

## 2022-07-30 DIAGNOSIS — I359 Nonrheumatic aortic valve disorder, unspecified: Secondary | ICD-10-CM | POA: Diagnosis not present

## 2022-07-30 DIAGNOSIS — I7121 Aneurysm of the ascending aorta, without rupture: Secondary | ICD-10-CM | POA: Diagnosis not present

## 2022-07-30 DIAGNOSIS — I2584 Coronary atherosclerosis due to calcified coronary lesion: Secondary | ICD-10-CM | POA: Diagnosis not present

## 2022-07-30 MED ORDER — NITROGLYCERIN 0.4 MG SL SUBL: 0.8000 mg | SUBLINGUAL_TABLET | Freq: Once | SUBLINGUAL | Status: DC

## 2022-07-30 MED ORDER — IOHEXOL 350 MG/ML SOLN
100.0000 mL | Freq: Once | INTRAVENOUS | Status: AC | PRN
  Administered 2022-07-30: 100 mL via INTRAVENOUS

## 2022-08-02 ENCOUNTER — Other Ambulatory Visit (HOSPITAL_COMMUNITY): Payer: Self-pay

## 2022-08-02 DIAGNOSIS — G4733 Obstructive sleep apnea (adult) (pediatric): Secondary | ICD-10-CM | POA: Diagnosis not present

## 2022-08-02 DIAGNOSIS — R7303 Prediabetes: Secondary | ICD-10-CM | POA: Diagnosis not present

## 2022-08-02 DIAGNOSIS — E785 Hyperlipidemia, unspecified: Secondary | ICD-10-CM | POA: Diagnosis not present

## 2022-08-02 MED ORDER — WEGOVY 1 MG/0.5ML ~~LOC~~ SOAJ
1.0000 mg | SUBCUTANEOUS | 0 refills | Status: AC
  Filled 2022-08-02: qty 2, 28d supply, fill #0

## 2022-08-10 DIAGNOSIS — M25561 Pain in right knee: Secondary | ICD-10-CM | POA: Diagnosis not present

## 2022-08-15 ENCOUNTER — Other Ambulatory Visit: Payer: Self-pay | Admitting: Cardiovascular Disease

## 2022-08-15 DIAGNOSIS — I359 Nonrheumatic aortic valve disorder, unspecified: Secondary | ICD-10-CM

## 2022-08-15 DIAGNOSIS — I7121 Aneurysm of the ascending aorta, without rupture: Secondary | ICD-10-CM

## 2022-08-15 DIAGNOSIS — E782 Mixed hyperlipidemia: Secondary | ICD-10-CM

## 2022-08-15 DIAGNOSIS — I10 Essential (primary) hypertension: Secondary | ICD-10-CM

## 2022-08-16 DIAGNOSIS — M25561 Pain in right knee: Secondary | ICD-10-CM | POA: Diagnosis not present

## 2022-08-18 ENCOUNTER — Other Ambulatory Visit: Payer: Self-pay | Admitting: Surgery

## 2022-08-18 DIAGNOSIS — M25561 Pain in right knee: Secondary | ICD-10-CM | POA: Diagnosis not present

## 2022-08-18 DIAGNOSIS — I7121 Aneurysm of the ascending aorta, without rupture: Secondary | ICD-10-CM

## 2022-08-24 DIAGNOSIS — M25561 Pain in right knee: Secondary | ICD-10-CM | POA: Diagnosis not present

## 2022-08-26 DIAGNOSIS — M25561 Pain in right knee: Secondary | ICD-10-CM | POA: Diagnosis not present

## 2022-08-30 DIAGNOSIS — M25561 Pain in right knee: Secondary | ICD-10-CM | POA: Diagnosis not present

## 2022-08-31 ENCOUNTER — Other Ambulatory Visit (HOSPITAL_COMMUNITY): Payer: Self-pay

## 2022-08-31 ENCOUNTER — Ambulatory Visit: Payer: Federal, State, Local not specified - PPO | Attending: Cardiovascular Disease

## 2022-08-31 DIAGNOSIS — Q231 Congenital insufficiency of aortic valve: Secondary | ICD-10-CM

## 2022-08-31 DIAGNOSIS — R7303 Prediabetes: Secondary | ICD-10-CM | POA: Diagnosis not present

## 2022-08-31 DIAGNOSIS — G4733 Obstructive sleep apnea (adult) (pediatric): Secondary | ICD-10-CM | POA: Diagnosis not present

## 2022-08-31 DIAGNOSIS — Z5181 Encounter for therapeutic drug level monitoring: Secondary | ICD-10-CM

## 2022-08-31 DIAGNOSIS — E785 Hyperlipidemia, unspecified: Secondary | ICD-10-CM | POA: Diagnosis not present

## 2022-08-31 LAB — POCT INR: INR: 3 (ref 2.0–3.0)

## 2022-08-31 MED ORDER — WEGOVY 1.7 MG/0.75ML ~~LOC~~ SOAJ
1.7000 mg | SUBCUTANEOUS | 1 refills | Status: DC
  Filled 2022-08-31: qty 3, 28d supply, fill #0
  Filled 2022-10-15: qty 3, 28d supply, fill #1

## 2022-08-31 NOTE — Patient Instructions (Signed)
Description   Continue taking Warfarin 7.5mg daily. Recheck INR in 6 weeks. Coumadin Clinic [phone] or [phone];Continue normal green intake.      

## 2022-09-01 DIAGNOSIS — M25561 Pain in right knee: Secondary | ICD-10-CM | POA: Diagnosis not present

## 2022-09-06 DIAGNOSIS — S83411A Sprain of medial collateral ligament of right knee, initial encounter: Secondary | ICD-10-CM | POA: Diagnosis not present

## 2022-09-12 ENCOUNTER — Other Ambulatory Visit: Payer: Self-pay | Admitting: Cardiovascular Disease

## 2022-09-12 DIAGNOSIS — Z952 Presence of prosthetic heart valve: Secondary | ICD-10-CM

## 2022-09-26 ENCOUNTER — Other Ambulatory Visit: Payer: Self-pay | Admitting: Cardiovascular Disease

## 2022-09-26 DIAGNOSIS — I10 Essential (primary) hypertension: Secondary | ICD-10-CM

## 2022-09-26 DIAGNOSIS — E782 Mixed hyperlipidemia: Secondary | ICD-10-CM

## 2022-09-26 DIAGNOSIS — I359 Nonrheumatic aortic valve disorder, unspecified: Secondary | ICD-10-CM

## 2022-09-26 DIAGNOSIS — I7121 Aneurysm of the ascending aorta, without rupture: Secondary | ICD-10-CM

## 2022-10-12 ENCOUNTER — Ambulatory Visit: Payer: Federal, State, Local not specified - PPO | Attending: Cardiology | Admitting: *Deleted

## 2022-10-12 DIAGNOSIS — Q231 Congenital insufficiency of aortic valve: Secondary | ICD-10-CM | POA: Diagnosis not present

## 2022-10-12 DIAGNOSIS — Z5181 Encounter for therapeutic drug level monitoring: Secondary | ICD-10-CM

## 2022-10-12 DIAGNOSIS — G4733 Obstructive sleep apnea (adult) (pediatric): Secondary | ICD-10-CM | POA: Diagnosis not present

## 2022-10-12 DIAGNOSIS — Q2381 Bicuspid aortic valve: Secondary | ICD-10-CM

## 2022-10-12 DIAGNOSIS — R7303 Prediabetes: Secondary | ICD-10-CM | POA: Diagnosis not present

## 2022-10-12 DIAGNOSIS — E785 Hyperlipidemia, unspecified: Secondary | ICD-10-CM | POA: Diagnosis not present

## 2022-10-12 LAB — POCT INR: INR: 2.3 (ref 2.0–3.0)

## 2022-10-12 NOTE — Patient Instructions (Signed)
Description   Continue taking Warfarin 7.5mg daily. Recheck INR in 6 weeks. Coumadin Clinic [phone] or [phone];Continue normal green intake.      

## 2022-10-13 ENCOUNTER — Ambulatory Visit: Payer: Federal, State, Local not specified - PPO | Admitting: Surgery

## 2022-10-13 ENCOUNTER — Encounter: Payer: Self-pay | Admitting: Surgery

## 2022-10-13 VITALS — BP 115/68 | HR 68 | Resp 20 | Ht 65.0 in | Wt 161.0 lb

## 2022-10-13 DIAGNOSIS — I7121 Aneurysm of the ascending aorta, without rupture: Secondary | ICD-10-CM | POA: Diagnosis not present

## 2022-10-13 NOTE — Progress Notes (Signed)
    HPI: ***  Current Outpatient Medications  Medication Sig Dispense Refill   aspirin EC 81 MG tablet Take by mouth every other day.     carvedilol (COREG) 6.25 MG tablet Take 1 tablet (6.25 mg total) by mouth 2 (two) times daily with a meal. 180 tablet 3   Cholecalciferol (VITAMIN D) 1000 UNITS capsule Take 1,000 Units by mouth daily.     co-enzyme Q-10 30 MG capsule Take 30 mg by mouth daily.     cyanocobalamin 100 MCG tablet 1 tablet Orally Once a day for 30 day(s)     hyoscyamine (LEVSIN SL) 0.125 MG SL tablet Place 1 tablet (0.125 mg total) under the tongue every 6 (six) hours as needed for cramping. 30 tablet 1   melatonin 5 MG TABS 5 mg. Taking at bedtime     metFORMIN (GLUCOPHAGE) 500 MG tablet Take 500 mg by mouth 2 (two) times daily with a meal.     metoprolol tartrate (LOPRESSOR) 100 MG tablet Take 1 tablet by mouth 2 hours prior to scan 1 tablet 0   Polyethyl Glycol-Propyl Glycol (SYSTANE OP) Apply 1 drop to eye every morning.     risedronate (ACTONEL) 35 MG tablet 1 tablet at least 30 minutes before the first food or drink, other than water, of the day by mouth once a week for 30 days     rosuvastatin (CRESTOR) 10 MG tablet TAKE ONE TABLET BY MOUTH DAILY **NEED OFFICE VISIT** 90 tablet 3   Semaglutide-Weight Management (WEGOVY) 0.25 MG/0.5ML SOAJ Inject 0.25 mg into the skin once a week. 2 mL 0   Semaglutide-Weight Management (WEGOVY) 0.5 MG/0.5ML SOAJ Inject 0.5 mg into the skin once a week. 2 mL 0   Semaglutide-Weight Management (WEGOVY) 0.5 MG/0.5ML SOAJ Inject 0.5 mg into the skin once a week. 2 mL 0   Semaglutide-Weight Management (WEGOVY) 1 MG/0.5ML SOAJ Inject 1 mg as directed once a week. 2 mL 0   Semaglutide-Weight Management (WEGOVY) 1.7 MG/0.75ML SOAJ Inject 1.7 mg into the skin once a week. 3 mL 1   Semaglutide-Weight Management 0.25 MG/0.5ML SOAJ Inject 0.25 mg into the skin once a week. 2 mL 0   Semaglutide-Weight Management 0.5 MG/0.5ML SOAJ Inject 0.5 mg into  the skin once a week. 2 mL 0   telmisartan (MICARDIS) 20 MG tablet TAKE ONE TABLET BY MOUTH DAILY 90 tablet 3   warfarin (COUMADIN) 10 MG tablet TAKE AS DIRECTED BY ANTICOAGULATION CLINIC 15 tablet 1   warfarin (COUMADIN) 7.5 MG tablet Take 7.5mg  daily by mouth daily as directed by Anticoagulation Clinic. 90 tablet 1   No current facility-administered medications for this visit.     Physical Exam: ***  Diagnostic Tests: ***  Impression: ***  Plan: ***   Alleen Borne, MD [facility] [phone]

## 2022-10-15 ENCOUNTER — Other Ambulatory Visit (HOSPITAL_COMMUNITY): Payer: Self-pay

## 2022-11-09 ENCOUNTER — Other Ambulatory Visit (HOSPITAL_COMMUNITY): Payer: Self-pay

## 2022-11-09 DIAGNOSIS — G4733 Obstructive sleep apnea (adult) (pediatric): Secondary | ICD-10-CM | POA: Diagnosis not present

## 2022-11-09 DIAGNOSIS — E785 Hyperlipidemia, unspecified: Secondary | ICD-10-CM | POA: Diagnosis not present

## 2022-11-09 DIAGNOSIS — E663 Overweight: Secondary | ICD-10-CM | POA: Diagnosis not present

## 2022-11-09 MED ORDER — WEGOVY 1.7 MG/0.75ML ~~LOC~~ SOAJ
1.7000 mg | SUBCUTANEOUS | 1 refills | Status: AC
  Filled 2022-11-09: qty 3, 28d supply, fill #0

## 2022-11-10 ENCOUNTER — Other Ambulatory Visit (HOSPITAL_COMMUNITY): Payer: Self-pay

## 2022-11-16 DIAGNOSIS — E785 Hyperlipidemia, unspecified: Secondary | ICD-10-CM | POA: Diagnosis not present

## 2022-11-16 DIAGNOSIS — E559 Vitamin D deficiency, unspecified: Secondary | ICD-10-CM | POA: Diagnosis not present

## 2022-11-16 DIAGNOSIS — M8580 Other specified disorders of bone density and structure, unspecified site: Secondary | ICD-10-CM | POA: Diagnosis not present

## 2022-11-16 DIAGNOSIS — I10 Essential (primary) hypertension: Secondary | ICD-10-CM | POA: Diagnosis not present

## 2022-11-17 DIAGNOSIS — Z85828 Personal history of other malignant neoplasm of skin: Secondary | ICD-10-CM | POA: Diagnosis not present

## 2022-11-17 DIAGNOSIS — L814 Other melanin hyperpigmentation: Secondary | ICD-10-CM | POA: Diagnosis not present

## 2022-11-17 DIAGNOSIS — D225 Melanocytic nevi of trunk: Secondary | ICD-10-CM | POA: Diagnosis not present

## 2022-11-17 DIAGNOSIS — L820 Inflamed seborrheic keratosis: Secondary | ICD-10-CM | POA: Diagnosis not present

## 2022-11-17 DIAGNOSIS — L821 Other seborrheic keratosis: Secondary | ICD-10-CM | POA: Diagnosis not present

## 2022-11-23 ENCOUNTER — Ambulatory Visit: Payer: Federal, State, Local not specified - PPO | Attending: Cardiovascular Disease

## 2022-11-23 DIAGNOSIS — Z5181 Encounter for therapeutic drug level monitoring: Secondary | ICD-10-CM

## 2022-11-23 DIAGNOSIS — Q231 Congenital insufficiency of aortic valve: Secondary | ICD-10-CM | POA: Diagnosis not present

## 2022-11-23 LAB — POCT INR: INR: 2.7 (ref 2.0–3.0)

## 2022-11-23 NOTE — Patient Instructions (Signed)
Continue taking Warfarin 7.5mg  daily. Recheck INR in 6 weeks. Coumadin Clinic [phone] or [phone];Continue normal green intake.

## 2022-12-02 ENCOUNTER — Other Ambulatory Visit: Payer: Self-pay

## 2022-12-02 ENCOUNTER — Other Ambulatory Visit (HOSPITAL_COMMUNITY): Payer: Self-pay

## 2022-12-02 MED ORDER — WEGOVY 1.7 MG/0.75ML ~~LOC~~ SOAJ
1.7000 mg | SUBCUTANEOUS | 1 refills | Status: DC
  Filled 2022-12-02: qty 3, 28d supply, fill #0

## 2022-12-02 MED ORDER — WEGOVY 1.7 MG/0.75ML ~~LOC~~ SOAJ
1.7000 mg | SUBCUTANEOUS | 1 refills | Status: AC
  Filled 2022-12-02: qty 3, 28d supply, fill #0
  Filled 2022-12-29: qty 3, 28d supply, fill #1

## 2022-12-06 ENCOUNTER — Other Ambulatory Visit (HOSPITAL_COMMUNITY): Payer: Self-pay

## 2022-12-14 DIAGNOSIS — G4733 Obstructive sleep apnea (adult) (pediatric): Secondary | ICD-10-CM | POA: Diagnosis not present

## 2022-12-14 DIAGNOSIS — E785 Hyperlipidemia, unspecified: Secondary | ICD-10-CM | POA: Diagnosis not present

## 2022-12-29 ENCOUNTER — Other Ambulatory Visit (HOSPITAL_COMMUNITY): Payer: Self-pay

## 2023-01-04 ENCOUNTER — Ambulatory Visit: Payer: Federal, State, Local not specified - PPO | Attending: Cardiovascular Disease

## 2023-01-04 DIAGNOSIS — Q2381 Bicuspid aortic valve: Secondary | ICD-10-CM

## 2023-01-04 DIAGNOSIS — Q231 Congenital insufficiency of aortic valve: Secondary | ICD-10-CM

## 2023-01-04 DIAGNOSIS — Z5181 Encounter for therapeutic drug level monitoring: Secondary | ICD-10-CM

## 2023-01-04 LAB — POCT INR: INR: 2.2 (ref 2.0–3.0)

## 2023-01-04 NOTE — Patient Instructions (Signed)
Continue taking Warfarin 7.5mg  daily. Recheck INR in 6 weeks. Coumadin Clinic [phone] or [phone];Continue normal green intake.

## 2023-01-25 ENCOUNTER — Other Ambulatory Visit (HOSPITAL_COMMUNITY): Payer: Self-pay

## 2023-01-25 DIAGNOSIS — G4733 Obstructive sleep apnea (adult) (pediatric): Secondary | ICD-10-CM | POA: Diagnosis not present

## 2023-01-25 DIAGNOSIS — E785 Hyperlipidemia, unspecified: Secondary | ICD-10-CM | POA: Diagnosis not present

## 2023-01-25 MED ORDER — WEGOVY 1.7 MG/0.75ML ~~LOC~~ SOAJ
1.7000 mg | SUBCUTANEOUS | 1 refills | Status: AC
  Filled 2023-01-25: qty 3, 28d supply, fill #0

## 2023-02-09 ENCOUNTER — Other Ambulatory Visit: Payer: Self-pay

## 2023-02-15 ENCOUNTER — Ambulatory Visit: Payer: Federal, State, Local not specified - PPO | Attending: Cardiovascular Disease | Admitting: *Deleted

## 2023-02-15 DIAGNOSIS — Z5181 Encounter for therapeutic drug level monitoring: Secondary | ICD-10-CM | POA: Diagnosis not present

## 2023-02-15 DIAGNOSIS — Q2381 Bicuspid aortic valve: Secondary | ICD-10-CM | POA: Diagnosis not present

## 2023-02-15 LAB — POCT INR: INR: 2.1 (ref 2.0–3.0)

## 2023-02-15 NOTE — Patient Instructions (Signed)
Description   Continue taking Warfarin 7.5mg daily. Recheck INR in 6 weeks. Coumadin Clinic [phone] or [phone];Continue normal green intake.      

## 2023-03-02 ENCOUNTER — Other Ambulatory Visit (HOSPITAL_COMMUNITY): Payer: Self-pay

## 2023-03-02 MED ORDER — WEGOVY 1.7 MG/0.75ML ~~LOC~~ SOAJ
1.7000 mg | SUBCUTANEOUS | 1 refills | Status: AC
  Filled 2023-03-02: qty 3, 28d supply, fill #0

## 2023-03-13 ENCOUNTER — Other Ambulatory Visit: Payer: Self-pay | Admitting: Cardiovascular Disease

## 2023-03-13 DIAGNOSIS — Z952 Presence of prosthetic heart valve: Secondary | ICD-10-CM

## 2023-03-21 ENCOUNTER — Other Ambulatory Visit (HOSPITAL_COMMUNITY): Payer: Self-pay

## 2023-03-21 DIAGNOSIS — G4733 Obstructive sleep apnea (adult) (pediatric): Secondary | ICD-10-CM | POA: Diagnosis not present

## 2023-03-21 DIAGNOSIS — I10 Essential (primary) hypertension: Secondary | ICD-10-CM | POA: Diagnosis not present

## 2023-03-21 DIAGNOSIS — E785 Hyperlipidemia, unspecified: Secondary | ICD-10-CM | POA: Diagnosis not present

## 2023-03-21 MED ORDER — WEGOVY 1.7 MG/0.75ML ~~LOC~~ SOAJ
1.7000 mg | SUBCUTANEOUS | 0 refills | Status: AC
  Filled 2023-03-21: qty 9, 84d supply, fill #0
  Filled 2023-03-30: qty 6, 56d supply, fill #0

## 2023-03-29 ENCOUNTER — Ambulatory Visit: Payer: Federal, State, Local not specified - PPO | Attending: Cardiology | Admitting: *Deleted

## 2023-03-29 DIAGNOSIS — Q2381 Bicuspid aortic valve: Secondary | ICD-10-CM | POA: Diagnosis not present

## 2023-03-29 DIAGNOSIS — Z5181 Encounter for therapeutic drug level monitoring: Secondary | ICD-10-CM

## 2023-03-29 LAB — POCT INR: INR: 2.6 (ref 2.0–3.0)

## 2023-03-29 NOTE — Patient Instructions (Signed)
Description   Continue taking Warfarin 7.5mg  daily. Recheck INR in 7 weeks. Coumadin Clinic [phone] or [phone];Continue normal green intake.

## 2023-03-30 ENCOUNTER — Other Ambulatory Visit (HOSPITAL_COMMUNITY): Payer: Self-pay

## 2023-03-30 MED ORDER — WEGOVY 1.7 MG/0.75ML ~~LOC~~ SOAJ
1.7000 mg | SUBCUTANEOUS | 0 refills | Status: AC
  Filled 2023-03-30: qty 9, 84d supply, fill #0

## 2023-05-10 DIAGNOSIS — I10 Essential (primary) hypertension: Secondary | ICD-10-CM | POA: Diagnosis not present

## 2023-05-10 DIAGNOSIS — G4733 Obstructive sleep apnea (adult) (pediatric): Secondary | ICD-10-CM | POA: Diagnosis not present

## 2023-05-10 DIAGNOSIS — E785 Hyperlipidemia, unspecified: Secondary | ICD-10-CM | POA: Diagnosis not present

## 2023-05-30 ENCOUNTER — Other Ambulatory Visit: Payer: Self-pay | Admitting: Cardiovascular Disease

## 2023-05-31 ENCOUNTER — Ambulatory Visit: Payer: Self-pay | Attending: Cardiology | Admitting: *Deleted

## 2023-05-31 DIAGNOSIS — Z5181 Encounter for therapeutic drug level monitoring: Secondary | ICD-10-CM

## 2023-05-31 DIAGNOSIS — Q2381 Bicuspid aortic valve: Secondary | ICD-10-CM

## 2023-05-31 LAB — POCT INR: INR: 1.9 — AB (ref 2.0–3.0)

## 2023-05-31 NOTE — Patient Instructions (Addendum)
Description   Today take 1/2 tablet more of warfarin (1.5 tablets total today) then continue taking Warfarin 7.5mg  daily. Recheck INR in 4 weeks. Coumadin Clinic [phone] or [phone];Continue normal green intake.

## 2023-06-16 ENCOUNTER — Other Ambulatory Visit: Payer: Self-pay

## 2023-06-16 DIAGNOSIS — Z952 Presence of prosthetic heart valve: Secondary | ICD-10-CM

## 2023-06-16 MED ORDER — WARFARIN SODIUM 7.5 MG PO TABS: ORAL_TABLET | ORAL | 0 refills | Status: DC

## 2023-06-16 NOTE — Telephone Encounter (Signed)
 Prescription refill request received for warfarin Lov: 07/07/22 Arlester Ladd)  Next INR check: 06/28/23 Warfarin tablet strength: 7.5mg   Appropriate dose. Refill sent.

## 2023-06-23 ENCOUNTER — Encounter: Payer: Self-pay | Admitting: Cardiovascular Disease

## 2023-06-23 ENCOUNTER — Ambulatory Visit
Payer: No Typology Code available for payment source | Attending: Cardiovascular Disease | Admitting: Cardiovascular Disease

## 2023-06-23 VITALS — BP 122/80 | HR 66 | Ht <= 58 in | Wt 138.2 lb

## 2023-06-23 DIAGNOSIS — I359 Nonrheumatic aortic valve disorder, unspecified: Secondary | ICD-10-CM

## 2023-06-23 DIAGNOSIS — E782 Mixed hyperlipidemia: Secondary | ICD-10-CM

## 2023-06-23 DIAGNOSIS — I7121 Aneurysm of the ascending aorta, without rupture: Secondary | ICD-10-CM | POA: Diagnosis not present

## 2023-06-23 DIAGNOSIS — I10 Essential (primary) hypertension: Secondary | ICD-10-CM | POA: Diagnosis not present

## 2023-06-23 NOTE — Patient Instructions (Signed)
Testing/Procedures: Echo in one year prior to appointment Your physician has requested that you have an echocardiogram. Echocardiography is a painless test that uses sound waves to create images of your heart. It provides your doctor with information about the size and shape of your heart and how well your heart's chambers and valves are working. This procedure takes approximately one hour. There are no restrictions for this procedure. Please do NOT wear cologne, perfume, aftershave, or lotions (deodorant is allowed). Please arrive 15 minutes prior to your appointment time.  Please note: We ask at that you not bring children with you during ultrasound (echo/ vascular) testing. Due to room size and safety concerns, children are not allowed in the ultrasound rooms during exams. Our front office staff cannot provide observation of children in our lobby area while testing is being conducted. An adult accompanying a patient to their appointment will only be allowed in the ultrasound room at the discretion of the ultrasound technician under special circumstances. We apologize for any inconvenience.   Follow-Up: At [facility], you and your health needs are our priority.  As part of our continuing mission to provide you with exceptional heart care, we have created designated Provider Care Teams.  These Care Teams include your primary Cardiologist (physician) and Advanced Practice Providers (APPs -  Physician Assistants and Nurse Practitioners) who all work together to provide you with the care you need, when you need it.  We recommend signing up for the patient portal called "MyChart".  Sign up information is provided on this After Visit Summary.  MyChart is used to connect with patients for Virtual Visits (Telemedicine).  Patients are able to view lab/test results, encounter notes, upcoming appointments, etc.  Non-urgent messages can be sent to your provider as well.   To learn more about what you  can do with MyChart, go to [email or web address].    Your next appointment:   1 year(s)  Provider:   Tonny Bollman, MD     Other Instructions   1st Floor: - Lobby - Registration  - Pharmacy  - Lab - Cafe  2nd Floor: - PV Lab - Diagnostic Testing (echo, CT, nuclear med)  3rd Floor: - Vacant  4th Floor: - TCTS (cardiothoracic surgery) - AFib Clinic - Structural Heart Clinic - Vascular Surgery  - Vascular Ultrasound  5th Floor: - HeartCare Cardiology (general and EP) - Clinical Pharmacy for coumadin, hypertension, lipid, weight-loss medications, and med management appointments    Valet parking services will be available as well.

## 2023-06-23 NOTE — Assessment & Plan Note (Signed)
Blood pressure is well-controlled on Micardis and carvedilol.  She is not having any symptoms of dizziness.  She states that at home she is getting some low blood pressure readings at times.  I reviewed potential symptoms of hypotension with her and she will reach out if she experiences any of those.  With her 40 pound weight loss she may ultimately need some reduction in her antihypertensive medications but will continue the same for now.

## 2023-06-23 NOTE — Progress Notes (Signed)
 Cardiology Office Note:    Date:  06/23/2023   ID:  Monica Key, DOB March 21, 1956, MRN [id number]  PCP:  Thana Ates, MD   [facility] Providers Cardiologist:  Tonny Bollman, MD     Referring MD: Thana Ates, MD   Chief Complaint  Patient presents with   Follow-up    Aortic valve disease s/p AVR    History of Present Illness:    Monica Key is a 68 y.o. female with a hx of  mechanical aortic valve replacement in 1999. She is also followed for an ascending aortic aneurysm.  She has undergone annual MRA studies and sees Dr. Laneta Simmers for surveillance of her aneurysm.  When she was seen last 1 year ago she was noted to have an increase in her transaortic gradients from 20 mmHg up to 28 mmHg.  She complained of mild exertional dyspnea.  The gated coronary CTA showed normal mobility of her bileaflet disks with no evidence of thrombus or pannus formation.  The ascending aorta measured 4.7 cm in diameter which was stable from previous studies.  Echo showed LVEF 60 to 65% with mean transvalvular gradient 28 mmHg with a dimensionless index of 0.32.  The patient is here alone today.  She is doing well.  She has lost about 40 pounds since last year.  She has been taking [place] but she is having trouble getting it with a change in her insurance.  She feels well and has good exercise tolerance and more energy with her weight loss.  She denies chest pain, chest pressure, or shortness of breath.  No heart palpitations, edema, orthopnea, or PND.  Her biggest concern remains cognitive decline as she has a history of dementia in her family.  Current Medications: Current Meds  Medication Sig   aspirin EC 81 MG tablet Take by mouth every other day.   carvedilol (COREG) 6.25 MG tablet Take 1 tablet (6.25 mg total) by mouth 2 (two) times daily with a meal.   Cholecalciferol (VITAMIN D) 1000 UNITS capsule Take 1,000 Units by mouth daily.   co-enzyme Q-10 30 MG capsule Take 30 mg by mouth daily.    cyanocobalamin 100 MCG tablet 1 tablet Orally Once a day for 30 day(s)   melatonin 5 MG TABS 5 mg. Taking at bedtime   metFORMIN (GLUCOPHAGE) 500 MG tablet Take 500 mg by mouth 2 (two) times daily with a meal.   Polyethyl Glycol-Propyl Glycol (SYSTANE OP) Apply 1 drop to eye every morning.   risedronate (ACTONEL) 35 MG tablet 1 tablet at least 30 minutes before the first food or drink, other than water, of the day by mouth once a week for 30 days   rosuvastatin (CRESTOR) 10 MG tablet TAKE ONE TABLET BY MOUTH DAILY **NEED OFFICE VISIT**   Semaglutide-Weight Management (WEGOVY) 0.25 MG/0.5ML SOAJ Inject 0.25 mg into the skin once a week.   Semaglutide-Weight Management (WEGOVY) 0.5 MG/0.5ML SOAJ Inject 0.5 mg into the skin once a week.   Semaglutide-Weight Management (WEGOVY) 0.5 MG/0.5ML SOAJ Inject 0.5 mg into the skin once a week.   Semaglutide-Weight Management (WEGOVY) 1 MG/0.5ML SOAJ Inject 1 mg as directed once a week.   Semaglutide-Weight Management (WEGOVY) 1.7 MG/0.75ML SOAJ Inject 1.7 mg into the skin once a week.   Semaglutide-Weight Management (WEGOVY) 1.7 MG/0.75ML SOAJ Inject 1.7mg  subcutaneously once a week.   Semaglutide-Weight Management (WEGOVY) 1.7 MG/0.75ML SOAJ Inject 1.7 mg into the skin once a week.   Semaglutide-Weight Management (  WEGOVY) 1.7 MG/0.75ML SOAJ Inject 1.7 mg into the skin once a week.   Semaglutide-Weight Management (WEGOVY) 1.7 MG/0.75ML SOAJ Inject 1.7 mg into the skin once a week.   Semaglutide-Weight Management (WEGOVY) 1.7 MG/0.75ML SOAJ Inject 1.7 mg into the skin once a week.   Semaglutide-Weight Management 0.25 MG/0.5ML SOAJ Inject 0.25 mg into the skin once a week.   Semaglutide-Weight Management 0.5 MG/0.5ML SOAJ Inject 0.5 mg into the skin once a week.   telmisartan (MICARDIS) 20 MG tablet TAKE ONE TABLET BY MOUTH DAILY   warfarin (COUMADIN) 7.5 MG tablet Take 7.5mg  daily by mouth daily as directed by Anticoagulation Clinic.   [DISCONTINUED]  hyoscyamine (LEVSIN SL) 0.125 MG SL tablet Place 1 tablet (0.125 mg total) under the tongue every 6 (six) hours as needed for cramping.   [DISCONTINUED] metoprolol tartrate (LOPRESSOR) 100 MG tablet Take 1 tablet by mouth 2 hours prior to scan   [DISCONTINUED] warfarin (COUMADIN) 10 MG tablet TAKE AS DIRECTED BY ANTICOAGULATION CLINIC     Allergies:   Hydralazine hcl, Penicillins, Sulfonamide derivatives, and Vancomycin   ROS:   Please see the history of present illness.    All other systems reviewed and are negative.  EKGs/Labs/Other Studies Reviewed:    The following studies were reviewed today: Cardiac Studies & Procedures   ______________________________________________________________________________________________   STRESS TESTS  MYOCARDIAL PERFUSION IMAGING 09/24/2014  Narrative Nl Exercise myoview stress test   ECHOCARDIOGRAM  ECHOCARDIOGRAM COMPLETE 06/10/2022  Narrative ECHOCARDIOGRAM REPORT    Patient Name:   Monica Key Date of Exam: 06/10/2022 Medical Rec #:  [id number]     Height:       65.0 in Accession #:    [id number]    Weight:       175.0 lb Date of Birth:  1955-11-17      BSA:          1.869 m Patient Age:    66 years      BP:           131/76 mmHg Patient Gender: F             HR:           56 bpm. Exam Location:  [address]  Procedure: 2D Echo, Cardiac Doppler and Color Doppler  Indications:    I71.21 Ascending aortic aneurysm  History:        Patient has prior history of Echocardiogram examinations, most recent 03/14/2019. S/p AVR (St Jude mechanical), Arrythmias:Palpitations; Signs/Symptoms:Ascending aortic aneurysm. Aortic Valve: mechanical valve is present in the aortic position.  Sonographer:    Samule Ohm RDCS Referring Phys: [phone] Dorotea Hand  IMPRESSIONS   1. Left ventricular ejection fraction, by estimation, is 60 to 65%. The left ventricle has normal function. The left ventricle has no regional wall motion abnormalities.  There is mild left ventricular hypertrophy. Left ventricular diastolic parameters are consistent with Grade I diastolic dysfunction (impaired relaxation). 2. Right ventricular systolic function is normal. The right ventricular size is normal. 3. Left atrial size was mildly dilated. 4. The mitral valve is normal in structure. Trivial mitral valve regurgitation. 5. The aortic valve has been repaired/replaced. Aortic valve regurgitation is trivial. There is a mechanical valve present in the aortic position. Aortic valve mean gradient measures 28.0 mmHg. EOA 1.0cm2, DI 0.32. Prior mean gradient in 2020 was 20.0 with EOA 1.1cm2. 6. Aortic dilatation noted. There is moderate dilatation of the ascending aorta, measuring 48 mm. 7. The inferior vena cava is normal  in size with greater than 50% respiratory variability, suggesting right atrial pressure of 3 mmHg.  Comparison(s): Prior TTE in 2020 with Aortic valve EOA 1.1cm2, mean gradient . Ascending aorta previously measured 45mm. Now measuring 48mm.  FINDINGS Left Ventricle: Left ventricular ejection fraction, by estimation, is 60 to 65%. The left ventricle has normal function. The left ventricle has no regional wall motion abnormalities. The left ventricular internal cavity size was normal in size. There is mild left ventricular hypertrophy. Left ventricular diastolic parameters are consistent with Grade I diastolic dysfunction (impaired relaxation).  Right Ventricle: The right ventricular size is normal. No increase in right ventricular wall thickness. Right ventricular systolic function is normal.  Left Atrium: Left atrial size was mildly dilated.  Right Atrium: Right atrial size was normal in size.  Pericardium: There is no evidence of pericardial effusion.  Mitral Valve: The mitral valve is normal in structure. Trivial mitral valve regurgitation.  Tricuspid Valve: The tricuspid valve is normal in structure. Tricuspid valve regurgitation  is mild.  Aortic Valve: EOA 1.0cm2 by continuity. DI 0.33. The aortic valve has been repaired/replaced. Aortic valve regurgitation is trivial. Aortic valve mean gradient measures 28.0 mmHg. Aortic valve peak gradient measures 50.2 mmHg. Aortic valve area, by VTI measures 0.89 cm. There is a mechanical valve present in the aortic position.  Pulmonic Valve: The pulmonic valve was normal in structure. Pulmonic valve regurgitation is trivial.  Aorta: Aortic dilatation noted. There is moderate dilatation of the ascending aorta, measuring 48 mm.  Venous: The inferior vena cava is normal in size with greater than 50% respiratory variability, suggesting right atrial pressure of 3 mmHg.  IAS/Shunts: The atrial septum is grossly normal.   LEFT VENTRICLE PLAX 2D LVIDd:         4.30 cm   Diastology LVIDs:         2.70 cm   LV e' medial:    6.96 cm/s LV PW:         1.30 cm   LV E/e' medial:  10.8 LV IVS:        1.40 cm   LV e' lateral:   9.46 cm/s LVOT diam:     1.90 cm   LV E/e' lateral: 7.9 LV SV:         77 LV SV Index:   41 LVOT Area:     2.84 cm   RIGHT VENTRICLE             IVC RV S prime:     12.80 cm/s  IVC diam: 1.10 cm TAPSE (M-mode): 1.6 cm RVSP:           22.0 mmHg  LEFT ATRIUM             Index        RIGHT ATRIUM           Index LA diam:        3.30 cm 1.77 cm/m   RA Pressure: 3.00 mmHg LA Vol (A2C):   60.2 ml 32.21 ml/m  RA Area:     15.20 cm LA Vol (A4C):   58.2 ml 31.14 ml/m  RA Volume:   35.70 ml  19.10 ml/m LA Biplane Vol: 63.2 ml 33.81 ml/m AORTIC VALVE AV Area (Vmax):    0.90 cm AV Area (Vmean):   0.87 cm AV Area (VTI):     0.89 cm AV Vmax:           354.33 cm/s AV Vmean:  249.667 cm/s AV VTI:            0.864 m AV Peak Grad:      50.2 mmHg AV Mean Grad:      28.0 mmHg LVOT Vmax:         113.00 cm/s LVOT Vmean:        76.400 cm/s LVOT VTI:          0.270 m LVOT/AV VTI ratio: 0.31  AORTA Ao Root diam: 3.10 cm Ao Asc diam:  4.55  cm  MITRAL VALVE                TRICUSPID VALVE MV Area (PHT): 2.08 cm     TR Peak grad:   19.0 mmHg MV Decel Time: 364 msec     TR Vmax:        218.00 cm/s MV E velocity: 74.90 cm/s   Estimated RAP:  3.00 mmHg MV A velocity: 116.00 cm/s  RVSP:           22.0 mmHg MV E/A ratio:  0.65 SHUNTS Systemic VTI:  0.27 m Systemic Diam: 1.90 cm  Laurance Flatten MD Electronically signed by Laurance Flatten MD Signature Date/Time: 06/10/2022/1:02:03 PM    Final      CT SCANS  CT CORONARY MORPH W/CTA COR W/SCORE 07/30/2022  Addendum 08/03/2022  9:27 PM ADDENDUM REPORT: 08/03/2022 21:24  EXAM: OVER-READ INTERPRETATION  CT CHEST  The following report is an over-read performed by radiologist Dr. Carola Frost [facility] on 08/03/2022. This over-read does not include interpretation of cardiac or coronary anatomy or pathology. The coronary CTA interpretation by the cardiologist is attached.  COMPARISON:  CT angiogram chest 07/30/2022  FINDINGS: There is atelectasis in the left lung base. The visualized lungs are otherwise clear. No mediastinal adenopathy identified. There is a 12 mm left hepatic cyst. No acute osseous abnormality. Sternotomy wires are present.  IMPRESSION: 1. No acute or significant extracardiac findings. 2. Atelectasis in the left lung base. 3. 12 mm left hepatic cyst.   Electronically Signed By: Darliss Cheney M.D. On: 08/03/2022 21:24  Narrative CLINICAL DATA:  Prosthetic valve stenosis. Unknown surgical details. Mechanical Aortic Valve with stenosis.  EXAM: Cardiac/Coronary CTA  TECHNIQUE: A non-contrast, gated CT scan was obtained with axial slices of 3 mm through the heart for calcium scoring. Calcium scoring was performed using the Agatston method. A 120 kV retrospective, gated, contrast cardiac scan was obtained. Gantry rotation speed was 250 msecs and collimation was 0.6 mm. The 3D data set was reconstructed in 5% intervals of  the 0-95% of the R-R cycle. Diastolic phases were analyzed on a dedicated workstation using MPR, MIP, and VRT modes. The patient received 95 cc of contrast.  FINDINGS: Image quality: Excellent.  Noise artifact is: Limited.  Aortic Prosthesis: Internal diameter of the prosthesis is 19 mm. This is likely a 21 mm St. Jude Bileaflet mechanical prosthesis. Opening angle 32 degrees. Closing angle 124 degrees. There is no evidence of pannus or valve thrombosis. The leaflets are freely movable. There is slight reduction in the opening angle, but this can be observed with a small prosthesis. Overall, findings suggest normal prosthetic valve function.  Coronary Arteries:  Normal coronary origin.  Right dominance.  Left main: The left main is a large caliber vessel with a normal take off from the left coronary cusp that trifurcates into a LAD, LCX, and ramus intermedius. There is minimal calcified plaque (<25%).  Left anterior descending artery: The LAD contains  minimal calcified plaque (<25%).  Ramus intermedius: Minimal calcified plaque (<25%).  Left circumflex artery: The LCX is non-dominant with minimal calcified plaque (<25%). The LCX gives off 1 patent obtuse marginal branch  Right coronary artery: The RCA is dominant with normal take off from the right coronary cusp. There is minimal calcified plaque (<25%). The RCA terminates as a PDA without evidence of plaque or stenosis.  Right Atrium: Right atrial size is within normal limits.  Right Ventricle: The right ventricular cavity is within normal limits.  Left Atrium: Left atrial size is normal in size with no left atrial appendage filling defect.  Left Ventricle: The ventricular cavity size is within normal limits.  Pulmonary arteries: Normal in size without proximal filling defect.  Pulmonary veins: Normal pulmonary venous drainage.  Pericardium: Normal thickness without significant effusion or calcium  present.  Cardiac valves: The aortic valve is trileaflet without significant calcification. The mitral valve is normal without significant calcification.  Aorta: Ascending aortic aneurysm up to 47 mm.  Extra-cardiac findings: See attached radiology report for non-cardiac structures.  IMPRESSION: 1. Coronary calcium score of 111. This was 77th percentile for age-, sex, and race-matched controls.  2. Normal coronary origin with right dominance.  3. Minimal calcified plaque (<25%) in the LAD/LCX/RCA.  4. 21 mm bileaflet mechanical valve with slightly reduced opening angle which can be seen with small prostheses. Findings are within limits for this prosthesis.  5. Ascending aortic aneurysm up to 47 mm.  Lennie Odor, MD  Electronically Signed: By: Lennie Odor M.D. On: 08/01/2022 19:21     ______________________________________________________________________________________________      EKG:   EKG Interpretation Date/Time:  Thursday June 23 2023 08:18:30 EST Ventricular Rate:  66 PR Interval:  162 QRS Duration:  96 QT Interval:  430 QTC Calculation: 450 R Axis:   89  Text Interpretation: Normal sinus rhythm Normal ECG When compared with ECG of 14-Nov-2008 10:39, Questionable change in QRS axis without any other significant change Confirmed by Tonny Bollman [phone]) on 06/23/2023 8:22:42 AM    Recent Labs: 07/07/2022: BUN 12; Creatinine, Ser 0.69; Potassium 4.3; Sodium 141  Recent Lipid Panel    Component Value Date/Time   CHOL 142 09/07/2021 0719   CHOL 133 12/12/2012 0743   TRIG 114 09/07/2021 0719   TRIG 96 12/12/2012 0743   HDL 52 09/07/2021 0719   HDL 45 12/12/2012 0743   CHOLHDL 2.7 09/07/2021 0719   CHOLHDL 3.7 01/16/2016 0835   VLDL 21 01/16/2016 0835   LDLCALC 69 09/07/2021 0719   LDLCALC 69 12/12/2012 0743   LDLDIRECT 151.7 06/21/2006 0902     Risk Assessment/Calculations:                Physical Exam:    VS:  BP 122/80   Pulse  66   Ht 4' 0.5" (1.232 m)   Wt 138 lb 3.2 oz (62.7 kg)   SpO2 96%   BMI 41.31 kg/m     Wt Readings from Last 3 Encounters:  06/23/23 138 lb 3.2 oz (62.7 kg)  10/13/22 161 lb (73 kg)  07/07/22 178 lb 12.8 oz (81.1 kg)     GEN:  Well nourished, well developed in no acute distress HEENT: Normal NECK: No JVD; No carotid bruits LYMPHATICS: No lymphadenopathy CARDIAC: RRR, 2/6 systolic ejection murmur at the right upper sternal border with normal mechanical A2. RESPIRATORY:  Clear to auscultation without rales, wheezing or rhonchi  ABDOMEN: Soft, non-tender, non-distended MUSCULOSKELETAL:  No edema; No deformity  SKIN: Warm and dry NEUROLOGIC:  Alert and oriented x 3 PSYCHIATRIC:  Normal affect   Assessment & Plan Aortic valve disorder The patient's most recent echocardiogram and cardiac CTA studies were reviewed.  The CTA suggests normal function of her bileaflet mechanical aortic valve.  Her mean transvalvular gradient had increased from 20 up to 28 mmHg last year.  Will order a follow-up echocardiogram next year prior to her visit.  There is no evidence of mechanical valvular dysfunction and no pannus formation. Aneurysm of ascending aorta without rupture (HCC) Followed by Dr. Laneta Simmers.  Ascending aorta measures 47 mm which has been stable over time.  She has a follow-up MRA and visit with him this summer. Mixed hyperlipidemia Treated with low-dose rosuvastatin.  LDL cholesterol is 66.  She will have follow-up labs with her primary care physician in May.  LFTs have been normal. Essential hypertension Blood pressure is well-controlled on Micardis and carvedilol.  She is not having any symptoms of dizziness.  She states that at home she is getting some low blood pressure readings at times.  I reviewed potential symptoms of hypotension with her and she will reach out if she experiences any of those.  With her 40 pound weight loss she may ultimately need some reduction in her antihypertensive  medications but will continue the same for now.            Medication Adjustments/Labs and Tests Ordered: Current medicines are reviewed at length with the patient today.  Concerns regarding medicines are outlined above.  Orders Placed This Encounter  Procedures   EKG 12-Lead   ECHOCARDIOGRAM COMPLETE   No orders of the defined types were placed in this encounter.   Patient Instructions  Testing/Procedures: Echo in one year prior to appointment Your physician has requested that you have an echocardiogram. Echocardiography is a painless test that uses sound waves to create images of your heart. It provides your doctor with information about the size and shape of your heart and how well your heart's chambers and valves are working. This procedure takes approximately one hour. There are no restrictions for this procedure. Please do NOT wear cologne, perfume, aftershave, or lotions (deodorant is allowed). Please arrive 15 minutes prior to your appointment time.  Please note: We ask at that you not bring children with you during ultrasound (echo/ vascular) testing. Due to room size and safety concerns, children are not allowed in the ultrasound rooms during exams. Our front office staff cannot provide observation of children in our lobby area while testing is being conducted. An adult accompanying a patient to their appointment will only be allowed in the ultrasound room at the discretion of the ultrasound technician under special circumstances. We apologize for any inconvenience.   Follow-Up: At [facility], you and your health needs are our priority.  As part of our continuing mission to provide you with exceptional heart care, we have created designated Provider Care Teams.  These Care Teams include your primary Cardiologist (physician) and Advanced Practice Providers (APPs -  Physician Assistants and Nurse Practitioners) who all work together to provide you with the care you  need, when you need it.  We recommend signing up for the patient portal called "MyChart".  Sign up information is provided on this After Visit Summary.  MyChart is used to connect with patients for Virtual Visits (Telemedicine).  Patients are able to view lab/test results, encounter notes, upcoming appointments, etc.  Non-urgent messages can be sent to your provider  as well.   To learn more about what you can do with MyChart, go to [email or web address].    Your next appointment:   1 year(s)  Provider:   Tonny Bollman, MD     Other Instructions   1st Floor: - Lobby - Registration  - Pharmacy  - Lab - Cafe  2nd Floor: - PV Lab - Diagnostic Testing (echo, CT, nuclear med)  3rd Floor: - Vacant  4th Floor: - TCTS (cardiothoracic surgery) - AFib Clinic - Structural Heart Clinic - Vascular Surgery  - Vascular Ultrasound  5th Floor: - HeartCare Cardiology (general and EP) - Clinical Pharmacy for coumadin, hypertension, lipid, weight-loss medications, and med management appointments    Valet parking services will be available as well.     Signed, Tonny Bollman, MD  06/23/2023 4:57 PM    [facility]

## 2023-06-23 NOTE — Assessment & Plan Note (Signed)
Followed by Dr. Laneta Simmers.  Ascending aorta measures 47 mm which has been stable over time.  She has a follow-up MRA and visit with him this summer.

## 2023-06-28 ENCOUNTER — Ambulatory Visit: Payer: No Typology Code available for payment source | Attending: Cardiovascular Disease | Admitting: *Deleted

## 2023-06-28 ENCOUNTER — Telehealth: Payer: Self-pay | Admitting: Cardiovascular Disease

## 2023-06-28 DIAGNOSIS — Z5181 Encounter for therapeutic drug level monitoring: Secondary | ICD-10-CM | POA: Diagnosis not present

## 2023-06-28 DIAGNOSIS — I7121 Aneurysm of the ascending aorta, without rupture: Secondary | ICD-10-CM

## 2023-06-28 DIAGNOSIS — Q2381 Bicuspid aortic valve: Secondary | ICD-10-CM

## 2023-06-28 DIAGNOSIS — E782 Mixed hyperlipidemia: Secondary | ICD-10-CM

## 2023-06-28 DIAGNOSIS — I10 Essential (primary) hypertension: Secondary | ICD-10-CM

## 2023-06-28 DIAGNOSIS — I359 Nonrheumatic aortic valve disorder, unspecified: Secondary | ICD-10-CM

## 2023-06-28 LAB — POCT INR: INR: 1.8 — AB (ref 2.0–3.0)

## 2023-06-28 MED ORDER — ROSUVASTATIN CALCIUM 10 MG PO TABS: 10.0000 mg | ORAL_TABLET | Freq: Every day | ORAL | 3 refills | Status: AC

## 2023-06-28 NOTE — Patient Instructions (Addendum)
 Description   Today take 1/2 tablet more of warfarin (1.5 tablets total today) then START taking Warfarin 7.5mg  daily except 11.25mg  on Sundays. Recheck INR in 3 weeks. Coumadin Clinic [phone] or [phone];Continue normal green intake.

## 2023-06-28 NOTE — Telephone Encounter (Signed)
*  STAT* If patient is at the pharmacy, call can be transferred to refill team.   1. Which medications need to be refilled? (please list name of each medication and dose if known) rosuvastatin (CRESTOR) 10 MG tablet    2. Would you like to learn more about the convenience, safety, & potential cost savings by using the [facility]? No     3. Are you open to using the [facility] (Type [facility]. No   4. Which pharmacy/location (including street and city if local pharmacy) is medication to be sent to?  [id number] - [place], [place] - [address]     5. Do they need a 30 day or 90 day supply? 90 day

## 2023-07-18 ENCOUNTER — Other Ambulatory Visit: Payer: Self-pay | Admitting: Internal Medicine

## 2023-07-18 DIAGNOSIS — M8580 Other specified disorders of bone density and structure, unspecified site: Secondary | ICD-10-CM

## 2023-07-19 ENCOUNTER — Ambulatory Visit: Payer: No Typology Code available for payment source | Attending: Cardiovascular Disease | Admitting: *Deleted

## 2023-07-19 DIAGNOSIS — Q2381 Bicuspid aortic valve: Secondary | ICD-10-CM

## 2023-07-19 DIAGNOSIS — Z5181 Encounter for therapeutic drug level monitoring: Secondary | ICD-10-CM | POA: Diagnosis not present

## 2023-07-19 LAB — POCT INR: INR: 2.2 (ref 2.0–3.0)

## 2023-07-19 NOTE — Patient Instructions (Addendum)
 Description   Continue taking Warfarin 7.5mg  daily except 11.25mg  on Sundays. Recheck INR in 4 weeks. Coumadin Clinic [phone] or [phone];Continue normal green intake.

## 2023-08-08 ENCOUNTER — Telehealth: Payer: Self-pay | Admitting: Cardiovascular Disease

## 2023-08-08 DIAGNOSIS — I7121 Aneurysm of the ascending aorta, without rupture: Secondary | ICD-10-CM

## 2023-08-08 DIAGNOSIS — I359 Nonrheumatic aortic valve disorder, unspecified: Secondary | ICD-10-CM

## 2023-08-08 DIAGNOSIS — E782 Mixed hyperlipidemia: Secondary | ICD-10-CM

## 2023-08-08 DIAGNOSIS — I10 Essential (primary) hypertension: Secondary | ICD-10-CM

## 2023-08-08 MED ORDER — TELMISARTAN 20 MG PO TABS: 20.0000 mg | ORAL_TABLET | Freq: Every day | ORAL | 3 refills | Status: AC

## 2023-08-08 NOTE — Telephone Encounter (Signed)
*  STAT* If patient is at the pharmacy, call can be transferred to refill team.   1. Which medications need to be refilled? (please list name of each medication and dose if known) telmisartan (MICARDIS) 20 MG tablet   DIFFERENT PHARMACY    4. Which pharmacy/location (including street and city if local pharmacy) is medication to be sent to? [id number] [place], [place] - [address] Phone: [phone]  Fax: [phone]       5. Do they need a 30 day or 90 day supply? 90

## 2023-08-08 NOTE — Telephone Encounter (Signed)
 Pt's medication was sent to pt's pharmacy as requested. Confirmation received.

## 2023-08-16 ENCOUNTER — Ambulatory Visit: Attending: Cardiology

## 2023-08-16 DIAGNOSIS — Q2381 Bicuspid aortic valve: Secondary | ICD-10-CM | POA: Diagnosis not present

## 2023-08-16 DIAGNOSIS — Z5181 Encounter for therapeutic drug level monitoring: Secondary | ICD-10-CM | POA: Diagnosis not present

## 2023-08-16 LAB — POCT INR: INR: 2.4 (ref 2.0–3.0)

## 2023-08-16 NOTE — Patient Instructions (Signed)
 Continue taking Warfarin 7.5mg  daily except 11.25mg  on Sundays. Recheck INR in 6 weeks. Coumadin Clinic [phone] or [phone];Continue normal green intake.

## 2023-08-22 ENCOUNTER — Telehealth: Payer: Self-pay | Admitting: Cardiovascular Disease

## 2023-08-22 MED ORDER — CARVEDILOL 6.25 MG PO TABS: 6.2500 mg | ORAL_TABLET | Freq: Two times a day (BID) | ORAL | 3 refills | Status: AC

## 2023-08-22 NOTE — Telephone Encounter (Signed)
*  STAT* If patient is at the pharmacy, call can be transferred to refill team.   1. Which medications need to be refilled? (please list name of each medication and dose if known) carvedilol (COREG) 6.25 MG tablet    2. Would you like to learn more about the convenience, safety, & potential cost savings by using the [facility]? No     3. Are you open to using the [facility] ([facility]. ). No   4. Which pharmacy/location (including street and city if local pharmacy) is medication to be sent to? [id number] - [place], [place] - [address]    5. Do they need a 30 day or 90 day supply? 90 day

## 2023-08-29 ENCOUNTER — Telehealth: Payer: Self-pay | Admitting: Cardiovascular Disease

## 2023-08-29 DIAGNOSIS — Z952 Presence of prosthetic heart valve: Secondary | ICD-10-CM

## 2023-08-29 MED ORDER — WARFARIN SODIUM 7.5 MG PO TABS
ORAL_TABLET | ORAL | 0 refills | Status: DC
Start: 2023-08-29 — End: 2023-09-09

## 2023-08-29 NOTE — Telephone Encounter (Signed)
*  STAT* If patient is at the pharmacy, call can be transferred to refill team.   1. Which medications need to be refilled? (please list name of each medication and dose if known)  warfarin (COUMADIN ) 7.5 MG tablet Take 7.5mg  daily by mouth daily as directed by Anticoagulation Clinic.    2. Which pharmacy/location (including street and city if local pharmacy) is medication to be sent to? [id number] [place], [place] [address] Phone: [phone]  Fax: [phone]     3. Do they need a 30 day or 90 day supply? 90

## 2023-08-29 NOTE — Telephone Encounter (Signed)
 Prescription refill request received for warfarin Lov: 06/23/23 Monica Key)  Next INR check: 09/27/23 Warfarin tablet strength: 7.5mg   Appropriate dose. Refill sent.

## 2023-09-09 ENCOUNTER — Other Ambulatory Visit: Payer: Self-pay | Admitting: Cardiovascular Disease

## 2023-09-09 DIAGNOSIS — Z952 Presence of prosthetic heart valve: Secondary | ICD-10-CM

## 2023-09-11 ENCOUNTER — Other Ambulatory Visit: Payer: Self-pay | Admitting: Cardiovascular Disease

## 2023-09-11 DIAGNOSIS — Z952 Presence of prosthetic heart valve: Secondary | ICD-10-CM

## 2023-09-13 ENCOUNTER — Other Ambulatory Visit: Payer: Self-pay | Admitting: Surgery

## 2023-09-13 DIAGNOSIS — I7121 Aneurysm of the ascending aorta, without rupture: Secondary | ICD-10-CM

## 2023-09-13 LAB — LAB REPORT - SCANNED: EGFR: 99

## 2023-09-27 ENCOUNTER — Ambulatory Visit: Attending: Cardiovascular Disease | Admitting: *Deleted

## 2023-09-27 DIAGNOSIS — Q2381 Bicuspid aortic valve: Secondary | ICD-10-CM

## 2023-09-27 DIAGNOSIS — Z5181 Encounter for therapeutic drug level monitoring: Secondary | ICD-10-CM | POA: Diagnosis not present

## 2023-09-27 LAB — POCT INR: INR: 2.2 (ref 2.0–3.0)

## 2023-09-27 NOTE — Patient Instructions (Signed)
 Description   Continue taking Warfarin 7.5mg  daily except 11.25mg  on Sundays. Recheck INR in 6 weeks. Coumadin  Clinic [phone];Continue normal green intake.

## 2023-10-11 ENCOUNTER — Ambulatory Visit
Admission: RE | Admit: 2023-10-11 | Discharge: 2023-10-11 | Disposition: A | Discharge status: Home / Self Care | Payer: PRIVATE HEALTH INSURANCE | Source: Ambulatory Visit | Attending: Surgery | Admitting: Surgery

## 2023-10-11 DIAGNOSIS — I7121 Aneurysm of the ascending aorta, without rupture: Secondary | ICD-10-CM

## 2023-10-11 MED ORDER — GADOPICLENOL 0.5 MMOL/ML IV SOLN
6.0000 mL | Freq: Once | INTRAVENOUS | Status: AC | PRN
  Administered 2023-10-11: 6 mL via INTRAVENOUS

## 2023-10-26 ENCOUNTER — Encounter: Payer: Self-pay | Admitting: Surgery

## 2023-10-26 ENCOUNTER — Ambulatory Visit: Payer: PRIVATE HEALTH INSURANCE | Attending: Surgery | Admitting: Surgery

## 2023-10-26 VITALS — BP 130/70 | HR 71 | Resp 20 | Ht 65.0 in | Wt 138.0 lb

## 2023-10-26 DIAGNOSIS — I7121 Aneurysm of the ascending aorta, without rupture: Secondary | ICD-10-CM

## 2023-10-29 NOTE — Progress Notes (Signed)
 [address] [place] [address]             [phone]    HPI:  The patient is a 68 year old woman who returns for follow-up of a 4.7 cm fusiform ascending aortic aneurysm following aortic valve replacement with a [employer] mechanical valve in 1999 for severe bicuspid aortic valve stenosis.  I have been following her ascending aortic aneurysm since 2010 with stable measurements between 4.6 and 4.8 cm.  She had a 2D echo on 06/10/2022 that showed an increase in the mean gradient to 28 mmHg compared to her previous echo in November 2021 when it was measured at 20 mmHg.  There was unchanged trivial aortic insufficiency.  Left ventricular ejection fraction was 60 to 65%.  She underwent a gated coronary CT on 08/01/2022 which showed no significant coronary disease.  The mechanical aortic valve was noted to have slightly reduced opening angle but was otherwise functioning normally.  The leaflets were freely movable with no signs of pannus or valve thrombosis. She continues to do well without chest pain or shortness of breath.  She remains active working out at [employer]   Current Outpatient Medications  Medication Sig Dispense Refill   aspirin EC 81 MG tablet Take by mouth every other day.     carvedilol  (COREG ) 6.25 MG tablet Take 1 tablet (6.25 mg total) by mouth 2 (two) times daily with a meal. 180 tablet 3   Cholecalciferol (VITAMIN D) 1000 UNITS capsule Take 1,000 Units by mouth daily.     co-enzyme Q-10 30 MG capsule Take 30 mg by mouth daily.     cyanocobalamin  100 MCG tablet 1 tablet Orally Once a day for 30 day(s)     melatonin 5 MG TABS 5 mg. Taking at bedtime     metFORMIN  (GLUCOPHAGE ) 500 MG tablet Take 500 mg by mouth 2 (two) times daily with a meal.     Polyethyl Glycol-Propyl Glycol (SYSTANE OP) Apply 1 drop to eye every morning.     risedronate (ACTONEL) 35 MG tablet 1 tablet at least 30 minutes before the first food or drink, other than water, of the day by mouth  once a week for 30 days     rosuvastatin  (CRESTOR ) 10 MG tablet Take 1 tablet (10 mg total) by mouth daily. 90 tablet 3   Semaglutide -Weight Management (WEGOVY ) 0.25 MG/0.5ML SOAJ Inject 0.25 mg into the skin once a week. 2 mL 0   Semaglutide -Weight Management (WEGOVY ) 0.5 MG/0.5ML SOAJ Inject 0.5 mg into the skin once a week. 2 mL 0   Semaglutide -Weight Management (WEGOVY ) 0.5 MG/0.5ML SOAJ Inject 0.5 mg into the skin once a week. 2 mL 0   Semaglutide -Weight Management (WEGOVY ) 1 MG/0.5ML SOAJ Inject 1 mg as directed once a week. 2 mL 0   Semaglutide -Weight Management (WEGOVY ) 1.7 MG/0.75ML SOAJ Inject 1.7 mg into the skin once a week. 3 mL 1   Semaglutide -Weight Management (WEGOVY ) 1.7 MG/0.75ML SOAJ Inject 1.7mg  subcutaneously once a week. 3 mL 1   Semaglutide -Weight Management (WEGOVY ) 1.7 MG/0.75ML SOAJ Inject 1.7 mg into the skin once a week. 3 mL 1   Semaglutide -Weight Management (WEGOVY ) 1.7 MG/0.75ML SOAJ Inject 1.7 mg into the skin once a week. 3 mL 1   Semaglutide -Weight Management (WEGOVY ) 1.7 MG/0.75ML SOAJ Inject 1.7 mg into the skin once a week. 9 mL 0   Semaglutide -Weight Management (WEGOVY ) 1.7 MG/0.75ML SOAJ Inject 1.7 mg into the skin once a  week. 9 mL 0   Semaglutide -Weight Management 0.25 MG/0.5ML SOAJ Inject 0.25 mg into the skin once a week. 2 mL 0   Semaglutide -Weight Management 0.5 MG/0.5ML SOAJ Inject 0.5 mg into the skin once a week. 2 mL 0   telmisartan  (MICARDIS ) 20 MG tablet Take 1 tablet (20 mg total) by mouth daily. 90 tablet 3   warfarin (COUMADIN ) 7.5 MG tablet TAKE 7.5MG  DAILY BY MOUTH DAILY AS DIRECTED BY ANTICOAGULATION CLINIC. 90 tablet 1   No current facility-administered medications for this visit.     Physical Exam: BP 130/70   Pulse 71   Resp 20   Ht 5' 5 (1.651 m)   Wt 138 lb (62.6 kg)   SpO2 97% Comment: RA  BMI 22.96 kg/m  She looks well. Cardiac exam shows a regular rate and rhythm with a crisp mechanical valve click.  There is no  murmur. Lungs are clear. There is no peripheral edema.  Diagnostic Tests:  Narrative & Impression  CLINICAL DATA:  Aneurysm of ascending aorta without rupture. History of aortic valve replacement.   EXAM: MRA CHEST WITH OR WITHOUT CONTRAST   TECHNIQUE: Angiographic images of the chest were obtained using MRA technique with and without intravenous contrast.   CONTRAST:  6 mL Vueway    COMPARISON:  CTA chest 07/30/2022   FINDINGS: VASCULAR   Aorta: Aortic valve replacement. Fusiform aneurysm of the ascending thoracic aorta measuring up to 4.7 cm and stable. Negative for an aortic dissection. Typical three-vessel arch anatomy. Proximal great vessels are patent. Proximal descending thoracic aorta measures 2.7 cm and stable.   Heart: Heart size is normal.  No significant pericardial effusion.   Pulmonary Arteries:  Normal caliber of the main pulmonary arteries.   Other: Limited evaluation of the abdominal visceral arteries. Bilateral innominate veins and SVC are patent.   NON-VASCULAR   Artifact from median sternotomy wires. No gross abnormality in the lungs. Mediastinal structures are unremarkable and no evidence for chest lymphadenopathy. No gross abnormality involving the bones or visualized spinal cord.   IMPRESSION: 1. Stable fusiform aneurysm of the ascending thoracic aorta measuring up to 4.7 cm. Recommend semi-annual imaging followup by CTA or MRA. This recommendation follows 2010 ACCF/AHA/AATS/ACR/ASA/SCA/SCAI/SIR/STS/SVM Guidelines for the Diagnosis and Management of Patients With Thoracic Aortic Disease. Circulation. 2010; 121: Z733-z630. Aortic aneurysm NOS (ICD10-I71.9) 2. Aortic valve replacement.     Electronically Signed   By: Juliene Balder M.D.   On: 10/11/2023 09:40    Impression:  She has a stable 4.7 cm fusiform ascending aortic aneurysm with a mechanical valve in place since 1999 for severe bicuspid aortic valve stenosis.  I reviewed the MRA  images with her and answered all of her questions.  I recommended continued follow-up with a repeat MRA study in 1 year. I stressed the importance of continued good blood pressure control in preventing further enlargement and acute aortic dissection.  I advised her against doing any heavy lifting that may require a Valsalva maneuver and could suddenly raise blood pressure to high levels.   Plan:  I will see her back in 1 year with a MRA of the chest for aortic surveillance.  She is scheduled for a follow-up echocardiogram in January 2026.  I spent 15 minutes performing this established patient evaluation and > 50% of this time was spent face to face counseling and coordinating the care of this patient's aortic aneurysm.    Dorise MARLA Fellers, MD [facility] [phone]

## 2023-11-08 ENCOUNTER — Ambulatory Visit: Payer: PRIVATE HEALTH INSURANCE | Attending: Cardiovascular Disease

## 2023-11-08 DIAGNOSIS — Z5181 Encounter for therapeutic drug level monitoring: Secondary | ICD-10-CM | POA: Diagnosis not present

## 2023-11-08 DIAGNOSIS — Q2381 Bicuspid aortic valve: Secondary | ICD-10-CM | POA: Diagnosis not present

## 2023-11-08 LAB — POCT INR: INR: 2.2 (ref 2.0–3.0)

## 2023-11-08 NOTE — Patient Instructions (Signed)
 Continue taking Warfarin 7.5mg  daily except 11.25mg  on Sundays. Recheck INR in 6 weeks. Coumadin  Clinic [phone] normal green intake.

## 2023-11-08 NOTE — Progress Notes (Signed)
Please see anticoagulation encounter.

## 2023-11-28 ENCOUNTER — Other Ambulatory Visit: Payer: Self-pay

## 2023-11-28 ENCOUNTER — Telehealth: Payer: Self-pay | Admitting: Cardiovascular Disease

## 2023-11-28 DIAGNOSIS — Z952 Presence of prosthetic heart valve: Secondary | ICD-10-CM

## 2023-11-28 MED ORDER — WARFARIN SODIUM 7.5 MG PO TABS: ORAL_TABLET | ORAL | 1 refills | Status: DC

## 2023-11-28 NOTE — Telephone Encounter (Signed)
 Pt c/o medication issue:  1. Name of Medication:   warfarin (COUMADIN ) 7.5 MG tablet    2. How are you currently taking this medication (dosage and times per day)? As written   3. Are you having a reaction (difficulty breathing--STAT)? No   4. What is your medication issue? Pt called in stating her pharmacy told her this rx needs to be changed because insurance won't cover it another refill. Please advise.

## 2023-12-20 ENCOUNTER — Ambulatory Visit: Payer: PRIVATE HEALTH INSURANCE | Attending: Cardiovascular Disease

## 2023-12-20 DIAGNOSIS — Z5181 Encounter for therapeutic drug level monitoring: Secondary | ICD-10-CM | POA: Diagnosis present

## 2023-12-20 DIAGNOSIS — Q2381 Bicuspid aortic valve: Secondary | ICD-10-CM | POA: Diagnosis present

## 2023-12-20 LAB — POCT INR: INR: 2.3 (ref 2.0–3.0)

## 2023-12-20 NOTE — Progress Notes (Signed)
 INR-2.3; Please see anticoagulation encounter

## 2023-12-20 NOTE — Patient Instructions (Signed)
 Description   Continue taking Warfarin 7.5mg  daily except 11.25mg  on Sundays. Recheck INR in 6 weeks. Coumadin  Clinic [phone];Continue normal green intake.

## 2024-01-31 ENCOUNTER — Ambulatory Visit: Payer: PRIVATE HEALTH INSURANCE | Attending: Cardiovascular Disease | Admitting: *Deleted

## 2024-01-31 DIAGNOSIS — Z5181 Encounter for therapeutic drug level monitoring: Secondary | ICD-10-CM | POA: Insufficient documentation

## 2024-01-31 DIAGNOSIS — Q2381 Bicuspid aortic valve: Secondary | ICD-10-CM | POA: Diagnosis present

## 2024-01-31 LAB — POCT INR: INR: 2.5 (ref 2.0–3.0)

## 2024-01-31 NOTE — Progress Notes (Signed)
 Description   INR-2.5; Continue taking Warfarin 7.5mg  daily except 11.25mg  on Sundays. Recheck INR on Friday-starting Prednisone Taper tomorrow (normally 6 weeks). Coumadin  Clinic [phone];Continue normal green intake.

## 2024-01-31 NOTE — Patient Instructions (Addendum)
 Description   INR-2.5; Continue taking Warfarin 7.5mg  daily except 11.25mg  on Sundays. Recheck INR on Friday-starting Prednisone Taper tomorrow (normally 6 weeks). Coumadin  Clinic [phone];Continue normal green intake.

## 2024-02-03 ENCOUNTER — Ambulatory Visit: Attending: Cardiovascular Disease | Admitting: *Deleted

## 2024-02-03 DIAGNOSIS — Z5181 Encounter for therapeutic drug level monitoring: Secondary | ICD-10-CM | POA: Insufficient documentation

## 2024-02-03 DIAGNOSIS — Q2381 Bicuspid aortic valve: Secondary | ICD-10-CM | POA: Diagnosis present

## 2024-02-03 LAB — POCT INR: INR: 1.8 — AB (ref 2.0–3.0)

## 2024-02-03 NOTE — Progress Notes (Signed)
 Description   INR-1.8; RESUME NORMAL GREEN INTAKE AND RESUME normal warfarin daose 7.5mg  daily except 11.25mg  on Sundays. Recheck INR in 3 weeks (normally 6 weeks). Coumadin  Clinic [phone];Continue normal green intake.

## 2024-02-03 NOTE — Patient Instructions (Addendum)
 Description   INR-1.8; RESUME NORMAL GREEN INTAKE AND RESUME normal warfarin daose 7.5mg  daily except 11.25mg  on Sundays. Recheck INR in 3 weeks (normally 6 weeks). Coumadin  Clinic [phone];Continue normal green intake.

## 2024-02-21 ENCOUNTER — Ambulatory Visit: Payer: PRIVATE HEALTH INSURANCE | Attending: Cardiology | Admitting: *Deleted

## 2024-02-21 DIAGNOSIS — Q2381 Bicuspid aortic valve: Secondary | ICD-10-CM | POA: Diagnosis present

## 2024-02-21 DIAGNOSIS — Z5181 Encounter for therapeutic drug level monitoring: Secondary | ICD-10-CM | POA: Insufficient documentation

## 2024-02-21 LAB — POCT INR: INR: 2 (ref 2.0–3.0)

## 2024-02-21 NOTE — Patient Instructions (Signed)
 Description   INR-2.0; Today take 11.25mg  then continue taking warfarin daose 7.5mg  daily except 11.25mg  on Sundays. Recheck INR in 4 weeks (normally 6 weeks). Coumadin  Clinic [phone];Continue normal green intake.

## 2024-02-21 NOTE — Progress Notes (Signed)
 Description   INR-2.0; Today take 11.25mg  then continue taking warfarin daose 7.5mg  daily except 11.25mg  on Sundays. Recheck INR in 4 weeks (normally 6 weeks). Coumadin  Clinic [phone];Continue normal green intake.

## 2024-02-25 ENCOUNTER — Other Ambulatory Visit: Payer: Self-pay | Admitting: Cardiovascular Disease

## 2024-02-25 DIAGNOSIS — Z952 Presence of prosthetic heart valve: Secondary | ICD-10-CM

## 2024-03-19 ENCOUNTER — Other Ambulatory Visit

## 2024-03-20 ENCOUNTER — Ambulatory Visit: Payer: PRIVATE HEALTH INSURANCE | Attending: Cardiovascular Disease | Admitting: *Deleted

## 2024-03-20 DIAGNOSIS — Z5181 Encounter for therapeutic drug level monitoring: Secondary | ICD-10-CM | POA: Insufficient documentation

## 2024-03-20 DIAGNOSIS — Q2381 Bicuspid aortic valve: Secondary | ICD-10-CM | POA: Diagnosis present

## 2024-03-20 LAB — POCT INR: INR: 2.3 (ref 2.0–3.0)

## 2024-03-20 NOTE — Patient Instructions (Signed)
 Description   INR-2.3; Continue taking warfarin dose 7.5mg  daily except 11.25mg  on Sundays. Recheck INR in 5 weeks (normally 6 weeks). Coumadin  Clinic [phone] normal green intake.

## 2024-03-20 NOTE — Progress Notes (Signed)
 Description   INR-2.3; Continue taking warfarin dose 7.5mg  daily except 11.25mg  on Sundays. Recheck INR in 5 weeks (normally 6 weeks). Coumadin  Clinic [phone] normal green intake.

## 2024-04-10 ENCOUNTER — Ambulatory Visit (HOSPITAL_BASED_OUTPATIENT_CLINIC_OR_DEPARTMENT_OTHER)
Admission: RE | Admit: 2024-04-10 | Discharge: 2024-04-10 | Disposition: A | Discharge status: Home / Self Care | Source: Ambulatory Visit | Attending: Internal Medicine | Admitting: Internal Medicine

## 2024-04-10 DIAGNOSIS — M8580 Other specified disorders of bone density and structure, unspecified site: Secondary | ICD-10-CM | POA: Insufficient documentation

## 2024-04-10 DIAGNOSIS — M810 Age-related osteoporosis without current pathological fracture: Secondary | ICD-10-CM | POA: Diagnosis not present

## 2024-04-10 DIAGNOSIS — Z780 Asymptomatic menopausal state: Secondary | ICD-10-CM | POA: Insufficient documentation

## 2024-04-24 ENCOUNTER — Ambulatory Visit: Attending: Cardiovascular Disease

## 2024-04-24 DIAGNOSIS — Q2381 Bicuspid aortic valve: Secondary | ICD-10-CM

## 2024-04-24 DIAGNOSIS — Z5181 Encounter for therapeutic drug level monitoring: Secondary | ICD-10-CM

## 2024-04-24 LAB — POCT INR: INR: 2.3 (ref 2.0–3.0)

## 2024-04-24 NOTE — Patient Instructions (Signed)
 Description   INR-2.3; Continue taking warfarin dose 7.5mg  daily except 11.25mg  on Sundays. Recheck INR in 6 weeks. Coumadin  Clinic [phone] normal green intake.

## 2024-04-24 NOTE — Progress Notes (Signed)
 Description   INR-2.3; Continue taking warfarin dose 7.5mg  daily except 11.25mg  on Sundays. Recheck INR in 6 weeks. Coumadin  Clinic [phone] normal green intake.

## 2024-05-15 ENCOUNTER — Ambulatory Visit: Payer: Self-pay | Admitting: Cardiovascular Disease

## 2024-05-15 ENCOUNTER — Ambulatory Visit (HOSPITAL_COMMUNITY)
Admission: RE | Admit: 2024-05-15 | Discharge: 2024-05-15 | Disposition: A | Discharge status: Home / Self Care | Payer: No Typology Code available for payment source | Source: Ambulatory Visit | Attending: Cardiology | Admitting: Cardiology

## 2024-05-15 DIAGNOSIS — I359 Nonrheumatic aortic valve disorder, unspecified: Secondary | ICD-10-CM | POA: Diagnosis present

## 2024-05-15 LAB — ECHOCARDIOGRAM COMPLETE
AR max vel: 0.85 cm2
AV Area VTI: 0.87 cm2
AV Area mean vel: 0.87 cm2
AV Mean grad: 26 mmHg
AV Peak grad: 49.8 mmHg
Ao pk vel: 3.53 m/s
Area-P 1/2: 2.48 cm2
S' Lateral: 2.6 cm

## 2024-06-05 ENCOUNTER — Ambulatory Visit: Attending: Cardiovascular Disease | Admitting: *Deleted

## 2024-06-05 ENCOUNTER — Ambulatory Visit

## 2024-06-05 DIAGNOSIS — Q2381 Bicuspid aortic valve: Secondary | ICD-10-CM | POA: Insufficient documentation

## 2024-06-05 DIAGNOSIS — Z5181 Encounter for therapeutic drug level monitoring: Secondary | ICD-10-CM | POA: Insufficient documentation

## 2024-06-05 LAB — POCT INR: INR: 1.8 — AB (ref 2.0–3.0)

## 2024-06-05 NOTE — Progress Notes (Signed)
 Description   INR-1.8; Today take 11.25mg  (1.5 tablets) of warfarin then continue taking warfarin dose 7.5mg  daily except 11.25mg  on Sundays. Recheck INR in 5 weeks. Coumadin  Clinic [phone]

## 2024-06-05 NOTE — Patient Instructions (Addendum)
 Description   INR-1.8; Today take 11.25mg  (1.5 tablets) of warfarin then continue taking warfarin dose 7.5mg  daily except 11.25mg  on Sundays. Recheck INR in 5 weeks. Coumadin  Clinic [phone]

## 2024-07-10 ENCOUNTER — Ambulatory Visit
